# Patient Record
Sex: Male | Born: 1935
Health system: Southern US, Community
[De-identification: ages and names within clinical notes are randomized; demographics above are authoritative.]

## PROBLEM LIST (undated history)

## (undated) DIAGNOSIS — I2510 Atherosclerotic heart disease of native coronary artery without angina pectoris: Secondary | ICD-10-CM

## (undated) DIAGNOSIS — R413 Other amnesia: Secondary | ICD-10-CM

## (undated) DIAGNOSIS — M79606 Pain in leg, unspecified: Secondary | ICD-10-CM

## (undated) DIAGNOSIS — I480 Paroxysmal atrial fibrillation: Secondary | ICD-10-CM

## (undated) DIAGNOSIS — K5732 Diverticulitis of large intestine without perforation or abscess without bleeding: Secondary | ICD-10-CM

## (undated) DIAGNOSIS — I10 Essential (primary) hypertension: Secondary | ICD-10-CM

## (undated) DIAGNOSIS — R600 Localized edema: Secondary | ICD-10-CM

## (undated) DIAGNOSIS — N189 Chronic kidney disease, unspecified: Secondary | ICD-10-CM

## (undated) DIAGNOSIS — E785 Hyperlipidemia, unspecified: Secondary | ICD-10-CM

## (undated) DIAGNOSIS — K219 Gastro-esophageal reflux disease without esophagitis: Secondary | ICD-10-CM

## (undated) DIAGNOSIS — N390 Urinary tract infection, site not specified: Secondary | ICD-10-CM

## (undated) DIAGNOSIS — I509 Heart failure, unspecified: Secondary | ICD-10-CM

## (undated) HISTORY — DX: Localized edema: R60.0

## (undated) HISTORY — DX: Hyperlipidemia, unspecified: E78.5

## (undated) HISTORY — DX: Paroxysmal atrial fibrillation: I48.0

## (undated) HISTORY — DX: Heart failure, unspecified: I50.9

## (undated) HISTORY — DX: Chronic kidney disease, unspecified: N18.9

## (undated) HISTORY — PX: OTHER SURGICAL HISTORY: SHX169

## (undated) HISTORY — DX: Urinary tract infection, site not specified: N39.0

## (undated) HISTORY — DX: Pain in leg, unspecified: M79.606

## (undated) HISTORY — DX: Diverticulitis of large intestine without perforation or abscess without bleeding: K57.32

## (undated) HISTORY — DX: Essential (primary) hypertension: I10

## (undated) HISTORY — DX: Gastro-esophageal reflux disease without esophagitis: K21.9

## (undated) HISTORY — DX: Atherosclerotic heart disease of native coronary artery without angina pectoris: I25.10

## (undated) HISTORY — DX: Other amnesia: R41.3

---

## 1984-10-18 HISTORY — PX: CARDIAC CATHETERIZATION: SHX172

## 1994-10-18 HISTORY — PX: OTHER SURGICAL HISTORY: SHX169

## 1996-10-18 HISTORY — PX: FLEXIBLE SIGMOIDOSCOPY: SHX1649

## 1997-12-16 ENCOUNTER — Encounter: Payer: Self-pay | Admitting: Family Medicine

## 1999-05-19 ENCOUNTER — Encounter: Payer: Self-pay | Admitting: Family Medicine

## 1999-05-19 LAB — CONVERTED CEMR LAB: PSA: 2.8 ng/mL

## 2000-05-18 ENCOUNTER — Encounter: Payer: Self-pay | Admitting: Family Medicine

## 2000-05-18 LAB — CONVERTED CEMR LAB: PSA: 3 ng/mL

## 2001-05-18 ENCOUNTER — Encounter: Payer: Self-pay | Admitting: Family Medicine

## 2001-05-18 LAB — CONVERTED CEMR LAB: PSA: 2.7 ng/mL

## 2002-03-29 ENCOUNTER — Inpatient Hospital Stay (HOSPITAL_COMMUNITY): Admission: EM | Admit: 2002-03-29 | Discharge: 2002-04-01 | Payer: Self-pay | Admitting: Emergency Medicine

## 2002-04-17 ENCOUNTER — Encounter: Payer: Self-pay | Admitting: Cardiology

## 2002-04-17 ENCOUNTER — Ambulatory Visit (HOSPITAL_COMMUNITY): Admission: RE | Admit: 2002-04-17 | Discharge: 2002-04-18 | Payer: Self-pay | Admitting: Cardiology

## 2002-04-17 HISTORY — PX: OTHER SURGICAL HISTORY: SHX169

## 2002-08-18 ENCOUNTER — Encounter: Payer: Self-pay | Admitting: Family Medicine

## 2002-08-18 LAB — CONVERTED CEMR LAB: PSA: 1.4 ng/mL

## 2002-09-27 ENCOUNTER — Ambulatory Visit (HOSPITAL_COMMUNITY): Admission: RE | Admit: 2002-09-27 | Discharge: 2002-09-27 | Payer: Self-pay | Admitting: Cardiology

## 2002-09-27 HISTORY — PX: OTHER SURGICAL HISTORY: SHX169

## 2002-10-03 HISTORY — PX: OTHER SURGICAL HISTORY: SHX169

## 2003-09-18 ENCOUNTER — Encounter: Payer: Self-pay | Admitting: Family Medicine

## 2003-09-18 LAB — CONVERTED CEMR LAB: PSA: 0.8 ng/mL

## 2003-09-26 HISTORY — PX: CYSTOSCOPY: SUR368

## 2003-11-21 ENCOUNTER — Inpatient Hospital Stay (HOSPITAL_COMMUNITY): Admission: EM | Admit: 2003-11-21 | Discharge: 2003-11-22 | Payer: Self-pay | Admitting: Emergency Medicine

## 2003-11-21 HISTORY — PX: OTHER SURGICAL HISTORY: SHX169

## 2003-12-05 HISTORY — PX: ESOPHAGOGASTRODUODENOSCOPY: SHX1529

## 2004-01-06 HISTORY — PX: OTHER SURGICAL HISTORY: SHX169

## 2004-09-02 ENCOUNTER — Ambulatory Visit: Payer: Self-pay | Admitting: Family Medicine

## 2004-10-05 ENCOUNTER — Ambulatory Visit: Payer: Self-pay | Admitting: Family Medicine

## 2004-11-05 ENCOUNTER — Ambulatory Visit: Payer: Self-pay | Admitting: Family Medicine

## 2004-12-07 ENCOUNTER — Ambulatory Visit: Payer: Self-pay | Admitting: Family Medicine

## 2004-12-14 ENCOUNTER — Ambulatory Visit: Payer: Self-pay | Admitting: Family Medicine

## 2004-12-16 ENCOUNTER — Ambulatory Visit: Payer: Self-pay | Admitting: Family Medicine

## 2005-01-05 ENCOUNTER — Ambulatory Visit: Payer: Self-pay | Admitting: Family Medicine

## 2005-01-07 ENCOUNTER — Ambulatory Visit: Payer: Self-pay | Admitting: Family Medicine

## 2005-02-08 ENCOUNTER — Ambulatory Visit: Payer: Self-pay | Admitting: Family Medicine

## 2005-03-11 ENCOUNTER — Ambulatory Visit: Payer: Self-pay | Admitting: Family Medicine

## 2005-03-22 ENCOUNTER — Ambulatory Visit: Payer: Self-pay | Admitting: Family Medicine

## 2005-04-12 ENCOUNTER — Ambulatory Visit: Payer: Self-pay | Admitting: Family Medicine

## 2005-05-12 ENCOUNTER — Ambulatory Visit: Payer: Self-pay | Admitting: Family Medicine

## 2005-06-11 ENCOUNTER — Ambulatory Visit: Payer: Self-pay | Admitting: Family Medicine

## 2005-06-15 ENCOUNTER — Ambulatory Visit: Payer: Self-pay | Admitting: Family Medicine

## 2005-08-11 ENCOUNTER — Ambulatory Visit: Payer: Self-pay | Admitting: Family Medicine

## 2005-10-08 ENCOUNTER — Ambulatory Visit: Payer: Self-pay | Admitting: Family Medicine

## 2005-12-02 ENCOUNTER — Ambulatory Visit: Payer: Self-pay | Admitting: Family Medicine

## 2005-12-20 ENCOUNTER — Ambulatory Visit: Payer: Self-pay | Admitting: Family Medicine

## 2005-12-20 LAB — CONVERTED CEMR LAB: PSA: 0.69 ng/mL

## 2005-12-22 ENCOUNTER — Ambulatory Visit: Payer: Self-pay | Admitting: Family Medicine

## 2006-01-06 ENCOUNTER — Ambulatory Visit: Payer: Self-pay | Admitting: Family Medicine

## 2006-01-19 ENCOUNTER — Ambulatory Visit: Payer: Self-pay | Admitting: Family Medicine

## 2006-01-26 ENCOUNTER — Ambulatory Visit: Payer: Self-pay | Admitting: Family Medicine

## 2006-02-01 ENCOUNTER — Ambulatory Visit: Payer: Self-pay | Admitting: Family Medicine

## 2006-03-01 ENCOUNTER — Ambulatory Visit: Payer: Self-pay | Admitting: Family Medicine

## 2006-04-28 ENCOUNTER — Ambulatory Visit: Payer: Self-pay | Admitting: Family Medicine

## 2006-06-24 ENCOUNTER — Ambulatory Visit: Payer: Self-pay | Admitting: Family Medicine

## 2006-08-25 ENCOUNTER — Ambulatory Visit: Payer: Self-pay | Admitting: Family Medicine

## 2006-09-27 ENCOUNTER — Ambulatory Visit: Payer: Self-pay | Admitting: Family Medicine

## 2006-12-20 ENCOUNTER — Ambulatory Visit: Payer: Self-pay | Admitting: Internal Medicine

## 2006-12-26 ENCOUNTER — Ambulatory Visit: Payer: Self-pay | Admitting: Family Medicine

## 2006-12-26 LAB — CONVERTED CEMR LAB
ALT: 28 units/L (ref 0–40)
AST: 31 units/L (ref 0–37)
Albumin: 3.6 g/dL (ref 3.5–5.2)
Alkaline Phosphatase: 35 units/L — ABNORMAL LOW (ref 39–117)
BUN: 12 mg/dL (ref 6–23)
Basophils Absolute: 0 10*3/uL (ref 0.0–0.1)
Basophils Relative: 0.3 % (ref 0.0–1.0)
Bilirubin, Direct: 0.2 mg/dL (ref 0.0–0.3)
CO2: 32 meq/L (ref 19–32)
Calcium: 9 mg/dL (ref 8.4–10.5)
Chloride: 104 meq/L (ref 96–112)
Cholesterol: 126 mg/dL (ref 0–200)
Creatinine, Ser: 0.9 mg/dL (ref 0.4–1.5)
Eosinophils Absolute: 0.2 10*3/uL (ref 0.0–0.6)
Eosinophils Relative: 4.6 % (ref 0.0–5.0)
Ferritin: 52.6 ng/mL (ref 22.0–322.0)
GFR calc Af Amer: 105 mL/min
GFR calc non Af Amer: 87 mL/min
Glucose, Bld: 89 mg/dL (ref 70–99)
HCT: 35.1 % — ABNORMAL LOW (ref 39.0–52.0)
HDL: 47.3 mg/dL (ref >39.0)
Hemoglobin: 12.1 g/dL — ABNORMAL LOW (ref 13.0–17.0)
LDL Cholesterol: 67 mg/dL (ref 0–99)
Lymphocytes Relative: 29.1 % (ref 12.0–46.0)
MCHC: 34.4 g/dL (ref 30.0–36.0)
MCV: 92.6 fL (ref 78.0–100.0)
Monocytes Absolute: 0.4 10*3/uL (ref 0.2–0.7)
Monocytes Relative: 10.3 % (ref 3.0–11.0)
Neutro Abs: 2.4 10*3/uL (ref 1.4–7.7)
Neutrophils Relative %: 55.7 % (ref 43.0–77.0)
Platelets: 163 10*3/uL (ref 150–400)
Potassium: 3.8 meq/L (ref 3.5–5.1)
RBC: 3.79 M/uL — ABNORMAL LOW (ref 4.22–5.81)
RDW: 12.6 % (ref 11.5–14.6)
Sodium: 141 meq/L (ref 135–145)
TSH: 2.63 microintl units/mL (ref 0.35–5.50)
Total Bilirubin: 1 mg/dL (ref 0.3–1.2)
Total CHOL/HDL Ratio: 2.7
Total Protein: 6.9 g/dL (ref 6.0–8.3)
Triglycerides: 58 mg/dL (ref 0–149)
VLDL: 12 mg/dL (ref 0–40)
Vitamin B-12: 239 pg/mL (ref 211–911)
WBC: 4.3 10*3/uL — ABNORMAL LOW (ref 4.5–10.5)

## 2006-12-27 ENCOUNTER — Encounter: Payer: Self-pay | Admitting: Family Medicine

## 2007-01-12 ENCOUNTER — Ambulatory Visit: Payer: Self-pay | Admitting: Family Medicine

## 2007-01-30 ENCOUNTER — Ambulatory Visit: Payer: Self-pay | Admitting: Family Medicine

## 2007-02-01 ENCOUNTER — Ambulatory Visit: Payer: Self-pay | Admitting: Cardiology

## 2007-02-08 ENCOUNTER — Ambulatory Visit: Payer: Self-pay

## 2007-02-08 ENCOUNTER — Encounter: Payer: Self-pay | Admitting: Family Medicine

## 2007-02-08 LAB — CONVERTED CEMR LAB
BUN: 20 mg/dL (ref 6–23)
CO2: 33 meq/L — ABNORMAL HIGH (ref 19–32)
Calcium: 9.5 mg/dL (ref 8.4–10.5)
Chloride: 104 meq/L (ref 96–112)
Creatinine, Ser: 1.1 mg/dL (ref 0.4–1.5)
GFR calc Af Amer: 83 mL/min
GFR calc non Af Amer: 69 mL/min
Glucose, Bld: 93 mg/dL (ref 70–99)
Potassium: 4 meq/L (ref 3.5–5.1)
Sodium: 142 meq/L (ref 135–145)

## 2007-02-22 ENCOUNTER — Encounter: Payer: Self-pay | Admitting: Family Medicine

## 2007-02-22 DIAGNOSIS — D509 Iron deficiency anemia, unspecified: Secondary | ICD-10-CM

## 2007-02-22 DIAGNOSIS — H269 Unspecified cataract: Secondary | ICD-10-CM

## 2007-02-22 DIAGNOSIS — E291 Testicular hypofunction: Secondary | ICD-10-CM

## 2007-02-22 DIAGNOSIS — Z87898 Personal history of other specified conditions: Secondary | ICD-10-CM

## 2007-02-22 DIAGNOSIS — K5730 Diverticulosis of large intestine without perforation or abscess without bleeding: Secondary | ICD-10-CM | POA: Insufficient documentation

## 2007-02-22 DIAGNOSIS — I10 Essential (primary) hypertension: Secondary | ICD-10-CM

## 2007-02-22 DIAGNOSIS — E538 Deficiency of other specified B group vitamins: Secondary | ICD-10-CM

## 2007-02-22 DIAGNOSIS — D500 Iron deficiency anemia secondary to blood loss (chronic): Secondary | ICD-10-CM | POA: Insufficient documentation

## 2007-02-28 ENCOUNTER — Ambulatory Visit: Payer: Self-pay | Admitting: Family Medicine

## 2007-03-02 ENCOUNTER — Ambulatory Visit: Payer: Self-pay | Admitting: Family Medicine

## 2007-06-05 ENCOUNTER — Ambulatory Visit: Payer: Self-pay | Admitting: Family Medicine

## 2007-06-05 LAB — CONVERTED CEMR LAB
CO2: 29 meq/L (ref 19–32)
Creatinine, Ser: 1.2 mg/dL (ref 0.4–1.5)
GFR calc Af Amer: 75 mL/min
Glucose, Bld: 97 mg/dL (ref 70–99)
Potassium: 3.7 meq/L (ref 3.5–5.1)
Sodium: 141 meq/L (ref 135–145)

## 2007-06-06 ENCOUNTER — Ambulatory Visit: Payer: Self-pay | Admitting: Family Medicine

## 2007-06-08 ENCOUNTER — Telehealth: Payer: Self-pay | Admitting: Family Medicine

## 2007-06-14 ENCOUNTER — Ambulatory Visit: Payer: Self-pay | Admitting: Cardiology

## 2007-06-14 LAB — CONVERTED CEMR LAB
BUN: 15 mg/dL (ref 6–23)
Basophils Relative: 0.7 % (ref 0.0–1.0)
CO2: 28 meq/L (ref 19–32)
Eosinophils Absolute: 0.2 10*3/uL (ref 0.0–0.6)
GFR calc Af Amer: 83 mL/min
GFR calc non Af Amer: 69 mL/min
Hemoglobin: 11.8 g/dL — ABNORMAL LOW (ref 13.0–17.0)
Lymphocytes Relative: 31.3 % (ref 12.0–46.0)
MCHC: 34.3 g/dL (ref 30.0–36.0)
MCV: 92.9 fL (ref 78.0–100.0)
Monocytes Absolute: 0.5 10*3/uL (ref 0.2–0.7)
Monocytes Relative: 9.3 % (ref 3.0–11.0)
Neutro Abs: 2.7 10*3/uL (ref 1.4–7.7)
Platelets: 156 10*3/uL (ref 150–400)
Potassium: 3.6 meq/L (ref 3.5–5.1)
Prothrombin Time: 12.1 s (ref 10.9–13.3)

## 2007-06-16 ENCOUNTER — Ambulatory Visit: Payer: Self-pay | Admitting: Cardiovascular Disease

## 2007-06-16 ENCOUNTER — Inpatient Hospital Stay (HOSPITAL_BASED_OUTPATIENT_CLINIC_OR_DEPARTMENT_OTHER): Admission: RE | Admit: 2007-06-16 | Discharge: 2007-06-16 | Payer: Self-pay | Admitting: Cardiovascular Disease

## 2007-06-27 ENCOUNTER — Ambulatory Visit: Payer: Self-pay | Admitting: Cardiology

## 2007-07-06 ENCOUNTER — Ambulatory Visit: Payer: Self-pay | Admitting: Family Medicine

## 2008-01-18 ENCOUNTER — Ambulatory Visit: Payer: Self-pay | Admitting: Family Medicine

## 2008-01-18 LAB — CONVERTED CEMR LAB
ALT: 27 units/L (ref 0–53)
Albumin: 4 g/dL (ref 3.5–5.2)
BUN: 13 mg/dL (ref 6–23)
Basophils Relative: 0.3 % (ref 0.0–1.0)
CO2: 31 meq/L (ref 19–32)
Calcium: 9.3 mg/dL (ref 8.4–10.5)
Cholesterol: 129 mg/dL (ref 0–200)
Creatinine, Ser: 1.1 mg/dL (ref 0.4–1.5)
Ferritin: 48.4 ng/mL (ref 22.0–322.0)
Glucose, Bld: 102 mg/dL — ABNORMAL HIGH (ref 70–99)
HCT: 36.2 % — ABNORMAL LOW (ref 39.0–52.0)
Hemoglobin: 12 g/dL — ABNORMAL LOW (ref 13.0–17.0)
LDL Cholesterol: 65 mg/dL (ref 0–99)
Lymphocytes Relative: 31.5 % (ref 12.0–46.0)
Monocytes Relative: 12.2 % — ABNORMAL HIGH (ref 3.0–12.0)
Neutro Abs: 2.5 10*3/uL (ref 1.4–7.7)
RBC: 3.9 M/uL — ABNORMAL LOW (ref 4.22–5.81)
Total CHOL/HDL Ratio: 2.5
Total Protein: 7.5 g/dL (ref 6.0–8.3)
Vitamin B-12: 335 pg/mL (ref 211–911)

## 2008-01-22 ENCOUNTER — Ambulatory Visit: Payer: Self-pay | Admitting: Family Medicine

## 2008-01-22 DIAGNOSIS — R609 Edema, unspecified: Secondary | ICD-10-CM

## 2008-02-07 ENCOUNTER — Ambulatory Visit: Payer: Self-pay | Admitting: Family Medicine

## 2008-02-08 LAB — CONVERTED CEMR LAB
CO2: 33 meq/L — ABNORMAL HIGH (ref 19–32)
Calcium: 9.9 mg/dL (ref 8.4–10.5)
Creatinine, Ser: 1.1 mg/dL (ref 0.4–1.5)
Glucose, Bld: 92 mg/dL (ref 70–99)
Sodium: 140 meq/L (ref 135–145)

## 2008-02-09 ENCOUNTER — Ambulatory Visit: Payer: Self-pay | Admitting: Family Medicine

## 2008-02-09 LAB — CONVERTED CEMR LAB
Ketones, urine, test strip: NEGATIVE
Urobilinogen, UA: 0.2
pH: 7

## 2008-02-15 ENCOUNTER — Encounter (INDEPENDENT_AMBULATORY_CARE_PROVIDER_SITE_OTHER): Payer: Self-pay | Admitting: *Deleted

## 2008-02-15 ENCOUNTER — Ambulatory Visit: Payer: Self-pay | Admitting: Family Medicine

## 2008-02-15 LAB — CONVERTED CEMR LAB: OCCULT 1: NEGATIVE

## 2008-02-26 ENCOUNTER — Ambulatory Visit: Payer: Self-pay | Admitting: Family Medicine

## 2008-02-26 LAB — CONVERTED CEMR LAB
BUN: 12 mg/dL (ref 6–23)
Basophils Relative: 1.6 % — ABNORMAL HIGH (ref 0.0–1.0)
CO2: 32 meq/L (ref 19–32)
Chloride: 106 meq/L (ref 96–112)
Creatinine, Ser: 1 mg/dL (ref 0.4–1.5)
Eosinophils Absolute: 0.2 10*3/uL (ref 0.0–0.7)
Eosinophils Relative: 5.6 % — ABNORMAL HIGH (ref 0.0–5.0)
Glucose, Bld: 89 mg/dL (ref 70–99)
Lymphocytes Relative: 34.2 % (ref 12.0–46.0)
MCV: 91.7 fL (ref 78.0–100.0)
Neutrophils Relative %: 46 % (ref 43.0–77.0)
RBC: 3.99 M/uL — ABNORMAL LOW (ref 4.22–5.81)
WBC: 3.9 10*3/uL — ABNORMAL LOW (ref 4.5–10.5)

## 2008-03-01 ENCOUNTER — Ambulatory Visit: Payer: Self-pay | Admitting: Family Medicine

## 2008-03-19 ENCOUNTER — Encounter: Payer: Self-pay | Admitting: Family Medicine

## 2008-06-10 ENCOUNTER — Ambulatory Visit: Payer: Self-pay | Admitting: Family Medicine

## 2008-06-10 DIAGNOSIS — IMO0002 Reserved for concepts with insufficient information to code with codable children: Secondary | ICD-10-CM

## 2008-06-13 ENCOUNTER — Ambulatory Visit: Payer: Self-pay | Admitting: Cardiology

## 2008-06-20 ENCOUNTER — Ambulatory Visit: Payer: Self-pay | Admitting: Cardiology

## 2008-06-20 ENCOUNTER — Ambulatory Visit: Payer: Self-pay

## 2008-06-20 LAB — CONVERTED CEMR LAB
BUN: 16 mg/dL (ref 6–23)
Chloride: 104 meq/L (ref 96–112)
Glucose, Bld: 82 mg/dL (ref 70–99)
Potassium: 3.8 meq/L (ref 3.5–5.1)

## 2008-06-21 ENCOUNTER — Ambulatory Visit: Payer: Self-pay

## 2008-11-29 ENCOUNTER — Ambulatory Visit: Payer: Self-pay | Admitting: Internal Medicine

## 2008-12-13 ENCOUNTER — Ambulatory Visit: Payer: Self-pay | Admitting: Internal Medicine

## 2008-12-13 HISTORY — PX: COLONOSCOPY: SHX174

## 2008-12-13 LAB — HM COLONOSCOPY

## 2009-01-08 ENCOUNTER — Ambulatory Visit: Payer: Self-pay | Admitting: Internal Medicine

## 2009-01-08 ENCOUNTER — Encounter: Payer: Self-pay | Admitting: Family Medicine

## 2009-01-24 ENCOUNTER — Ambulatory Visit: Payer: Self-pay | Admitting: Family Medicine

## 2009-01-29 ENCOUNTER — Ambulatory Visit: Payer: Self-pay | Admitting: Family Medicine

## 2009-01-29 DIAGNOSIS — E559 Vitamin D deficiency, unspecified: Secondary | ICD-10-CM | POA: Insufficient documentation

## 2009-01-29 LAB — CONVERTED CEMR LAB
Bacteria, UA: 0
Bilirubin Urine: NEGATIVE
Glucose, Urine, Semiquant: NEGATIVE
Urobilinogen, UA: 0.2
pH: 6

## 2009-04-23 ENCOUNTER — Ambulatory Visit: Payer: Self-pay | Admitting: Family Medicine

## 2009-04-23 LAB — CONVERTED CEMR LAB
ALT: 38 units/L (ref 0–53)
AST: 44 units/L — ABNORMAL HIGH (ref 0–37)
Albumin: 4.2 g/dL (ref 3.5–5.2)
BUN: 17 mg/dL (ref 6–23)
Basophils Relative: 0.7 % (ref 0.0–3.0)
Chloride: 103 meq/L (ref 96–112)
Cholesterol: 131 mg/dL (ref 0–200)
Eosinophils Relative: 4.5 % (ref 0.0–5.0)
HCT: 37.4 % — ABNORMAL LOW (ref 39.0–52.0)
Hemoglobin: 12.8 g/dL — ABNORMAL LOW (ref 13.0–17.0)
LDL Cholesterol: 69 mg/dL (ref 0–99)
Lymphs Abs: 1.3 10*3/uL (ref 0.7–4.0)
MCV: 94.5 fL (ref 78.0–100.0)
Monocytes Absolute: 0.4 10*3/uL (ref 0.1–1.0)
Monocytes Relative: 10.6 % (ref 3.0–12.0)
Neutro Abs: 1.7 10*3/uL (ref 1.4–7.7)
Platelets: 143 10*3/uL — ABNORMAL LOW (ref 150.0–400.0)
Potassium: 3.5 meq/L (ref 3.5–5.1)
Sodium: 142 meq/L (ref 135–145)
TSH: 2.11 microintl units/mL (ref 0.35–5.50)
Total Bilirubin: 1.3 mg/dL — ABNORMAL HIGH (ref 0.3–1.2)
Total Protein: 7.4 g/dL (ref 6.0–8.3)
Vitamin B-12: 221 pg/mL (ref 211–911)
WBC: 3.6 10*3/uL — ABNORMAL LOW (ref 4.5–10.5)

## 2009-04-30 ENCOUNTER — Ambulatory Visit: Payer: Self-pay | Admitting: Family Medicine

## 2009-06-13 ENCOUNTER — Ambulatory Visit: Payer: Self-pay | Admitting: Cardiology

## 2009-06-13 DIAGNOSIS — I2510 Atherosclerotic heart disease of native coronary artery without angina pectoris: Secondary | ICD-10-CM

## 2009-06-18 ENCOUNTER — Telehealth (INDEPENDENT_AMBULATORY_CARE_PROVIDER_SITE_OTHER): Payer: Self-pay

## 2009-06-19 ENCOUNTER — Ambulatory Visit: Payer: Self-pay

## 2009-06-19 ENCOUNTER — Encounter: Payer: Self-pay | Admitting: Cardiology

## 2009-06-19 ENCOUNTER — Ambulatory Visit: Payer: Self-pay | Admitting: Cardiology

## 2009-06-20 ENCOUNTER — Encounter: Payer: Self-pay | Admitting: Cardiology

## 2009-06-20 ENCOUNTER — Encounter (INDEPENDENT_AMBULATORY_CARE_PROVIDER_SITE_OTHER): Payer: Self-pay | Admitting: *Deleted

## 2009-06-20 DIAGNOSIS — R9430 Abnormal result of cardiovascular function study, unspecified: Secondary | ICD-10-CM | POA: Insufficient documentation

## 2009-06-20 LAB — CONVERTED CEMR LAB
BUN: 17 mg/dL (ref 6–23)
CO2: 30 meq/L (ref 19–32)
Chloride: 104 meq/L (ref 96–112)
Creatinine, Ser: 1 mg/dL (ref 0.4–1.5)
Potassium: 3.7 meq/L (ref 3.5–5.1)

## 2009-06-24 ENCOUNTER — Encounter: Payer: Self-pay | Admitting: Cardiology

## 2009-06-25 ENCOUNTER — Ambulatory Visit: Payer: Self-pay | Admitting: Cardiology

## 2009-06-25 ENCOUNTER — Ambulatory Visit (HOSPITAL_COMMUNITY): Admission: RE | Admit: 2009-06-25 | Discharge: 2009-06-25 | Payer: Self-pay | Admitting: Cardiology

## 2009-06-25 DIAGNOSIS — I4891 Unspecified atrial fibrillation: Secondary | ICD-10-CM | POA: Insufficient documentation

## 2009-06-25 HISTORY — PX: OTHER SURGICAL HISTORY: SHX169

## 2009-06-27 ENCOUNTER — Ambulatory Visit: Payer: Self-pay | Admitting: Internal Medicine

## 2009-06-27 ENCOUNTER — Ambulatory Visit: Payer: Self-pay | Admitting: Cardiology

## 2009-06-27 LAB — CONVERTED CEMR LAB
BUN: 16 mg/dL (ref 6–23)
CO2: 31 meq/L (ref 19–32)
Chloride: 106 meq/L (ref 96–112)
Creatinine, Ser: 1.1 mg/dL (ref 0.4–1.5)
Potassium: 3.6 meq/L (ref 3.5–5.1)

## 2009-07-02 ENCOUNTER — Ambulatory Visit: Payer: Self-pay | Admitting: Internal Medicine

## 2009-07-02 LAB — CONVERTED CEMR LAB: POC INR: 2.2

## 2009-07-09 ENCOUNTER — Ambulatory Visit: Payer: Self-pay | Admitting: Internal Medicine

## 2009-07-09 LAB — CONVERTED CEMR LAB: POC INR: 2.4

## 2009-07-16 ENCOUNTER — Ambulatory Visit: Payer: Self-pay | Admitting: Cardiovascular Disease

## 2009-07-30 ENCOUNTER — Ambulatory Visit: Payer: Self-pay | Admitting: Internal Medicine

## 2009-07-30 LAB — CONVERTED CEMR LAB: POC INR: 1.6

## 2009-08-04 ENCOUNTER — Telehealth: Payer: Self-pay | Admitting: Family Medicine

## 2009-08-13 ENCOUNTER — Ambulatory Visit: Payer: Self-pay | Admitting: Cardiology

## 2009-08-13 LAB — CONVERTED CEMR LAB: POC INR: 2.7

## 2009-09-03 ENCOUNTER — Ambulatory Visit: Payer: Self-pay | Admitting: Cardiovascular Disease

## 2009-09-03 LAB — CONVERTED CEMR LAB: POC INR: 2.4

## 2009-10-01 ENCOUNTER — Ambulatory Visit: Payer: Self-pay | Admitting: Cardiology

## 2009-10-29 ENCOUNTER — Ambulatory Visit: Payer: Self-pay | Admitting: Internal Medicine

## 2009-10-29 ENCOUNTER — Ambulatory Visit: Payer: Self-pay | Admitting: Family Medicine

## 2009-10-29 LAB — CONVERTED CEMR LAB: POC INR: 2.1

## 2009-10-31 LAB — CONVERTED CEMR LAB
Basophils Absolute: 0 10*3/uL (ref 0.0–0.1)
HCT: 34.2 % — ABNORMAL LOW (ref 39.0–52.0)
Iron: 62 ug/dL (ref 42–165)
Lymphs Abs: 1 10*3/uL (ref 0.7–4.0)
MCV: 97.3 fL (ref 78.0–100.0)
Monocytes Absolute: 0.4 10*3/uL (ref 0.1–1.0)
Neutrophils Relative %: 56.2 % (ref 43.0–77.0)
Platelets: 148 10*3/uL — ABNORMAL LOW (ref 150.0–400.0)
RDW: 13 % (ref 11.5–14.6)
Vitamin B-12: 294 pg/mL (ref 211–911)
WBC: 3.7 10*3/uL — ABNORMAL LOW (ref 4.5–10.5)

## 2009-11-17 ENCOUNTER — Ambulatory Visit: Payer: Self-pay | Admitting: Family Medicine

## 2009-11-26 ENCOUNTER — Ambulatory Visit: Payer: Self-pay | Admitting: Cardiology

## 2009-12-10 ENCOUNTER — Ambulatory Visit: Payer: Self-pay | Admitting: Cardiology

## 2009-12-10 ENCOUNTER — Ambulatory Visit: Payer: Self-pay | Admitting: Internal Medicine

## 2009-12-10 DIAGNOSIS — I5033 Acute on chronic diastolic (congestive) heart failure: Secondary | ICD-10-CM

## 2009-12-11 LAB — CONVERTED CEMR LAB
BUN: 20 mg/dL (ref 6–23)
CO2: 32 meq/L (ref 19–32)
Calcium: 9.5 mg/dL (ref 8.4–10.5)
Chloride: 102 meq/L (ref 96–112)
Creatinine, Ser: 1.3 mg/dL (ref 0.4–1.5)
Eosinophils Relative: 4.1 % (ref 0.0–5.0)
Glucose, Bld: 97 mg/dL (ref 70–99)
HCT: 33.5 % — ABNORMAL LOW (ref 39.0–52.0)
Hemoglobin: 11.2 g/dL — ABNORMAL LOW (ref 13.0–17.0)
Lymphs Abs: 1 10*3/uL (ref 0.7–4.0)
Monocytes Relative: 12 % (ref 3.0–12.0)
Neutro Abs: 2.1 10*3/uL (ref 1.4–7.7)
RBC: 3.49 M/uL — ABNORMAL LOW (ref 4.22–5.81)
WBC: 3.8 10*3/uL — ABNORMAL LOW (ref 4.5–10.5)

## 2009-12-17 ENCOUNTER — Ambulatory Visit: Payer: Self-pay | Admitting: Cardiology

## 2009-12-24 ENCOUNTER — Ambulatory Visit: Payer: Self-pay | Admitting: Cardiovascular Disease

## 2009-12-24 LAB — CONVERTED CEMR LAB: POC INR: 1.7

## 2009-12-30 ENCOUNTER — Ambulatory Visit: Payer: Self-pay | Admitting: Cardiology

## 2009-12-30 LAB — CONVERTED CEMR LAB: POC INR: 2.6

## 2010-01-05 LAB — CONVERTED CEMR LAB
CO2: 33 meq/L — ABNORMAL HIGH (ref 19–32)
Calcium: 9.5 mg/dL (ref 8.4–10.5)
Chloride: 102 meq/L (ref 96–112)
Sodium: 140 meq/L (ref 135–145)

## 2010-01-06 ENCOUNTER — Ambulatory Visit: Payer: Self-pay | Admitting: Cardiovascular Disease

## 2010-01-06 LAB — CONVERTED CEMR LAB: POC INR: 2.5

## 2010-01-13 ENCOUNTER — Ambulatory Visit: Payer: Self-pay | Admitting: Internal Medicine

## 2010-01-13 ENCOUNTER — Encounter (INDEPENDENT_AMBULATORY_CARE_PROVIDER_SITE_OTHER): Payer: Self-pay | Admitting: Cardiology

## 2010-01-13 LAB — CONVERTED CEMR LAB: POC INR: 4.1

## 2010-01-18 HISTORY — PX: OTHER SURGICAL HISTORY: SHX169

## 2010-01-19 ENCOUNTER — Encounter: Payer: Self-pay | Admitting: Cardiology

## 2010-01-21 ENCOUNTER — Ambulatory Visit: Payer: Self-pay | Admitting: Cardiology

## 2010-01-21 ENCOUNTER — Ambulatory Visit: Payer: Self-pay

## 2010-01-21 ENCOUNTER — Encounter: Payer: Self-pay | Admitting: Cardiology

## 2010-01-21 ENCOUNTER — Ambulatory Visit (HOSPITAL_COMMUNITY): Admission: RE | Admit: 2010-01-21 | Discharge: 2010-01-21 | Payer: Self-pay | Admitting: Cardiology

## 2010-01-21 LAB — CONVERTED CEMR LAB: POC INR: 3.7

## 2010-01-27 ENCOUNTER — Telehealth: Payer: Self-pay | Admitting: Family Medicine

## 2010-01-28 ENCOUNTER — Ambulatory Visit: Payer: Self-pay | Admitting: Family Medicine

## 2010-01-28 LAB — CONVERTED CEMR LAB
ALT: 29 units/L (ref 0–53)
HDL: 45.3 mg/dL (ref >39.00)

## 2010-02-02 ENCOUNTER — Encounter (INDEPENDENT_AMBULATORY_CARE_PROVIDER_SITE_OTHER): Payer: Self-pay | Admitting: *Deleted

## 2010-02-02 ENCOUNTER — Ambulatory Visit: Payer: Self-pay | Admitting: Cardiology

## 2010-02-02 LAB — CONVERTED CEMR LAB: POC INR: 3.6

## 2010-02-03 ENCOUNTER — Ambulatory Visit (HOSPITAL_COMMUNITY): Admission: RE | Admit: 2010-02-03 | Discharge: 2010-02-03 | Payer: Self-pay | Admitting: Cardiology

## 2010-02-03 HISTORY — PX: CARDIOVERSION: SHX1299

## 2010-02-05 ENCOUNTER — Telehealth: Payer: Self-pay | Admitting: Cardiology

## 2010-02-09 ENCOUNTER — Telehealth: Payer: Self-pay | Admitting: Family Medicine

## 2010-02-11 ENCOUNTER — Ambulatory Visit: Payer: Self-pay | Admitting: Family Medicine

## 2010-02-11 LAB — CONVERTED CEMR LAB
ALT: 19 units/L (ref 0–53)
AST: 24 units/L (ref 0–37)
HDL: 34.6 mg/dL — ABNORMAL LOW (ref >39.00)
Total CHOL/HDL Ratio: 5

## 2010-02-25 ENCOUNTER — Ambulatory Visit: Payer: Self-pay | Admitting: Cardiology

## 2010-02-25 ENCOUNTER — Ambulatory Visit: Payer: Self-pay | Admitting: Internal Medicine

## 2010-02-25 LAB — CONVERTED CEMR LAB: POC INR: 1.5

## 2010-02-26 LAB — CONVERTED CEMR LAB
CO2: 32 meq/L (ref 19–32)
Chloride: 101 meq/L (ref 96–112)
Creatinine, Ser: 1.4 mg/dL (ref 0.4–1.5)
Potassium: 4 meq/L (ref 3.5–5.1)
Sodium: 142 meq/L (ref 135–145)

## 2010-03-04 ENCOUNTER — Ambulatory Visit: Payer: Self-pay | Admitting: Cardiology

## 2010-03-04 LAB — CONVERTED CEMR LAB
BUN: 27 mg/dL — ABNORMAL HIGH (ref 6–23)
CO2: 31 meq/L (ref 19–32)
Chloride: 101 meq/L (ref 96–112)
Glucose, Bld: 96 mg/dL (ref 70–99)
Potassium: 3.9 meq/L (ref 3.5–5.1)
Sodium: 142 meq/L (ref 135–145)

## 2010-03-06 ENCOUNTER — Telehealth: Payer: Self-pay | Admitting: Cardiology

## 2010-03-11 ENCOUNTER — Ambulatory Visit: Payer: Self-pay | Admitting: Cardiology

## 2010-03-11 ENCOUNTER — Ambulatory Visit: Payer: Self-pay | Admitting: Internal Medicine

## 2010-03-13 ENCOUNTER — Telehealth (INDEPENDENT_AMBULATORY_CARE_PROVIDER_SITE_OTHER): Payer: Self-pay | Admitting: *Deleted

## 2010-03-18 LAB — CONVERTED CEMR LAB
BUN: 28 mg/dL — ABNORMAL HIGH (ref 6–23)
CO2: 32 meq/L (ref 19–32)
Chloride: 101 meq/L (ref 96–112)
Creatinine, Ser: 1.9 mg/dL — ABNORMAL HIGH (ref 0.4–1.5)
Glucose, Bld: 91 mg/dL (ref 70–99)
Potassium: 3.7 meq/L (ref 3.5–5.1)

## 2010-03-25 ENCOUNTER — Ambulatory Visit: Payer: Self-pay | Admitting: Family Medicine

## 2010-03-25 LAB — CONVERTED CEMR LAB: ALT: 26 units/L (ref 0–53)

## 2010-03-26 ENCOUNTER — Ambulatory Visit: Payer: Self-pay | Admitting: Internal Medicine

## 2010-03-26 ENCOUNTER — Ambulatory Visit: Payer: Self-pay | Admitting: Cardiology

## 2010-03-27 ENCOUNTER — Telehealth: Payer: Self-pay | Admitting: Cardiology

## 2010-04-02 ENCOUNTER — Ambulatory Visit: Payer: Self-pay | Admitting: Cardiology

## 2010-04-06 LAB — CONVERTED CEMR LAB
BUN: 24 mg/dL — ABNORMAL HIGH (ref 6–23)
Chloride: 100 meq/L (ref 96–112)
GFR calc non Af Amer: 57.26 mL/min (ref >60)
Glucose, Bld: 96 mg/dL (ref 70–99)
Potassium: 3.9 meq/L (ref 3.5–5.1)
Sodium: 140 meq/L (ref 135–145)

## 2010-04-13 ENCOUNTER — Ambulatory Visit: Payer: Self-pay | Admitting: Cardiology

## 2010-04-13 LAB — CONVERTED CEMR LAB: POC INR: 2.1

## 2010-04-24 ENCOUNTER — Ambulatory Visit: Payer: Self-pay | Admitting: Cardiology

## 2010-04-24 LAB — CONVERTED CEMR LAB: POC INR: 1.2

## 2010-05-04 ENCOUNTER — Ambulatory Visit: Payer: Self-pay | Admitting: Cardiology

## 2010-05-04 LAB — CONVERTED CEMR LAB: POC INR: 2.2

## 2010-05-25 ENCOUNTER — Ambulatory Visit: Payer: Self-pay | Admitting: Cardiovascular Disease

## 2010-05-25 LAB — CONVERTED CEMR LAB: POC INR: 1.9

## 2010-05-26 ENCOUNTER — Encounter (INDEPENDENT_AMBULATORY_CARE_PROVIDER_SITE_OTHER): Payer: Self-pay | Admitting: *Deleted

## 2010-06-15 ENCOUNTER — Ambulatory Visit: Payer: Self-pay | Admitting: Cardiology

## 2010-07-06 ENCOUNTER — Ambulatory Visit: Payer: Self-pay | Admitting: Cardiovascular Disease

## 2010-07-06 LAB — CONVERTED CEMR LAB: POC INR: 1.3

## 2010-07-13 ENCOUNTER — Encounter: Payer: Self-pay | Admitting: Internal Medicine

## 2010-07-16 ENCOUNTER — Ambulatory Visit: Payer: Self-pay | Admitting: Cardiology

## 2010-07-16 LAB — CONVERTED CEMR LAB: POC INR: 1.3

## 2010-07-23 ENCOUNTER — Telehealth (INDEPENDENT_AMBULATORY_CARE_PROVIDER_SITE_OTHER): Payer: Self-pay | Admitting: *Deleted

## 2010-07-23 ENCOUNTER — Ambulatory Visit: Payer: Self-pay | Admitting: Family Medicine

## 2010-07-23 LAB — CONVERTED CEMR LAB
ALT: 23 units/L (ref 0–53)
AST: 34 units/L (ref 0–37)
Alkaline Phosphatase: 37 units/L — ABNORMAL LOW (ref 39–117)
BUN: 30 mg/dL — ABNORMAL HIGH (ref 6–23)
Basophils Absolute: 0 10*3/uL (ref 0.0–0.1)
Bilirubin, Direct: 0.1 mg/dL (ref 0.0–0.3)
CO2: 32 meq/L (ref 19–32)
Calcium: 8.9 mg/dL (ref 8.4–10.5)
Chloride: 99 meq/L (ref 96–112)
Cholesterol: 148 mg/dL (ref 0–200)
Creatinine, Ser: 1.5 mg/dL (ref 0.4–1.5)
Eosinophils Absolute: 0.2 10*3/uL (ref 0.0–0.7)
HCT: 32.1 % — ABNORMAL LOW (ref 39.0–52.0)
Lymphs Abs: 1.8 10*3/uL (ref 0.7–4.0)
MCV: 97.5 fL (ref 78.0–100.0)
Monocytes Absolute: 0.5 10*3/uL (ref 0.1–1.0)
Neutrophils Relative %: 38.8 % — ABNORMAL LOW (ref 43.0–77.0)
PSA: 0.79 ng/mL (ref 0.10–4.00)
Platelets: 163 10*3/uL (ref 150.0–400.0)
RDW: 13.3 % (ref 11.5–14.6)
Total Protein: 7.2 g/dL (ref 6.0–8.3)
Triglycerides: 81 mg/dL (ref 0.0–149.0)
Vitamin B-12: 348 pg/mL (ref 211–911)

## 2010-07-27 ENCOUNTER — Ambulatory Visit: Payer: Self-pay | Admitting: Cardiology

## 2010-07-30 ENCOUNTER — Ambulatory Visit: Payer: Self-pay | Admitting: Family Medicine

## 2010-07-31 ENCOUNTER — Encounter: Payer: Self-pay | Admitting: Family Medicine

## 2010-08-04 ENCOUNTER — Ambulatory Visit: Payer: Self-pay | Admitting: Cardiology

## 2010-08-04 ENCOUNTER — Encounter: Payer: Self-pay | Admitting: Cardiology

## 2010-08-04 LAB — CONVERTED CEMR LAB: POC INR: 1.3

## 2010-08-14 ENCOUNTER — Ambulatory Visit: Payer: Self-pay | Admitting: Cardiovascular Disease

## 2010-08-14 LAB — CONVERTED CEMR LAB: POC INR: 1.1

## 2010-08-21 ENCOUNTER — Ambulatory Visit: Payer: Self-pay | Admitting: Cardiology

## 2010-08-21 LAB — CONVERTED CEMR LAB: POC INR: 1.5

## 2010-08-28 ENCOUNTER — Ambulatory Visit: Payer: Self-pay | Admitting: Cardiology

## 2010-08-28 LAB — CONVERTED CEMR LAB: POC INR: 1.6

## 2010-09-11 ENCOUNTER — Ambulatory Visit: Payer: Self-pay | Admitting: Cardiology

## 2010-09-11 LAB — CONVERTED CEMR LAB: POC INR: 3.9

## 2010-09-25 ENCOUNTER — Ambulatory Visit: Payer: Self-pay | Admitting: Internal Medicine

## 2010-10-09 ENCOUNTER — Ambulatory Visit: Payer: Self-pay | Admitting: Cardiology

## 2010-10-09 LAB — CONVERTED CEMR LAB: POC INR: 2.8

## 2010-10-30 ENCOUNTER — Ambulatory Visit
Admission: RE | Admit: 2010-10-30 | Discharge: 2010-10-30 | Payer: Self-pay | Source: Home / Self Care | Attending: Cardiology | Admitting: Cardiology

## 2010-11-17 NOTE — Medication Information (Signed)
Summary: rov/sp  Anticoagulant Therapy  Managed by: Eda Keys, PharmD Referring MD: Charlies Constable, MD PCP: Shaune Leeks MD Supervising MD: Ladona Ridgel MD, Sharlot Gowda Indication 1: Atrial Fibrillation Lab Used: [facility] [place] Site: [address] INR POC 1.5 INR RANGE 2-3  Dietary changes: no    Health status changes: no    Bleeding/hemorrhagic complications: no    Recent/future hospitalizations: no    Any changes in medication regimen? no    Recent/future dental: no  Any missed doses?: no       Is patient compliant with meds? yes       Allergies: 1)  ! Pcn 2)  Naprosyn (Naproxen)  Anticoagulation Management History:      The patient is taking warfarin and comes in today for a routine follow up visit.  Positive risk factors for bleeding include an age of 75 years or older and presence of serious comorbidities.  The bleeding index is 'intermediate risk'.  Positive CHADS2 values include History of CHF, History of HTN, and Age > 41 years old.  His last INR was 1.0 RATIO.  Anticoagulation responsible provider: Ladona Ridgel MD, Sharlot Gowda.  INR POC: 1.5.  Cuvette Lot#: [id number].  Exp: 05/2011.    Anticoagulation Management Assessment/Plan:      The patient's current anticoagulation dose is Warfarin sodium 5 mg tabs: Use as directed by Anticoagulation Clinic.  The target INR is 2.0-3.0.  The next INR is due 03/11/2010.  Anticoagulation instructions were given to patient.  Results were reviewed/authorized by Eda Keys, PharmD.  He was notified by Eda Keys.         Prior Anticoagulation Instructions: INR 3.6  Skip tomorrow's dose of Coumadin then decrease dose to 1 tablet every day except 1/2 tablet on Monday and Friday   Current Anticoagulation Instructions: INR 1.5  Take 1.5 tablets today.  Start NEW dosing schedule of 1/2 tablet on Monday only, and 1 tablet all other days.  Return to clinic in 2 weeks.

## 2010-11-17 NOTE — Assessment & Plan Note (Signed)
Summary: f25m   Visit Type:  Follow-up Primary Provider:  Shaune Leeks MD  CC:  no complaints.  History of Present Illness: patient is 77 years old and return for management of atrial fibrillation, CAD, and CHF. He had an inferior MI in 2003 treated with a bare-metal stent to the RCA and he later had a drug-eluting stent to the circumflex artery. Last fall he developed new onset atrial fibrillation and recently he underwent treatment with Multaq followedl by DC cardioversion. He has also had diastolic heart failure and at his last visit we increased his Lasix dosage. He had a baseline creatinine of 1.4 and went up to 1.8 following increased Lasix now back to baseline.   His other problems include hypertension, hyperlipidemia, and peripheral vascular disease.  Current Medications (verified): 1)  Cardura 8 Mg Tabs (Doxazosin Mesylate) .... Take 1/2 Tab  By Mouth Daily 2)  Iron 325 (65 Fe) Mg Tabs (Ferrous Sulfate) .... Take One By Mouth Twice A Day 3)  Simvastatin 80 Mg Tabs (Simvastatin) .... One Tab By Mouth At Night 4)  Nitrostat 0.4 Mg Subl (Nitroglycerin) .... Use Sl As Directed As Needed 5)  Fosamax 10 Mg Tabs (Alendronate Sodium) .Marland Kitchen.. 1 Daily By Mouth 6)  Ramipril 10 Mg Caps (Ramipril) .... Take 1 Capsule By Mouth Twice A Day 7)  Furosemide 40 Mg Tabs (Furosemide) .... Take 1 and 1/2 Tablets in The Morning and One Tablet in The Afternoon. 8)  Vitamin D3 1000 Unit Caps (Cholecalciferol) .Marland Kitchen.. 1 Daily By Mouth 9)  Warfarin Sodium 5 Mg Tabs (Warfarin Sodium) .... Use As Directed By Anticoagulation Clinic 10)  Buffered Aspirin 325 Mg Tabs (Aspirin Buf(Cacarb-Mgcarb-Mgo)) .... Take One By Mouth Daily 11)  Multivitamins  Tabs (Multiple Vitamin) .... Take One By Mouth Daily 12)  Multaq 400 Mg Tabs (Dronedarone Hcl) .... Take One Tab By Mouth Two Times A Day 13)  Potassium Chloride Crys Cr 20 Meq Cr-Tabs (Potassium Chloride Crys Cr) .... Take One Tablet By Mouth Daily  Allergies  (verified): 1)  ! Pcn 2)  Naprosyn (Naproxen)  Review of Systems       negative except per HPI  Vital Signs:  Patient profile:   77 year old male Height:      71 inches Weight:      195 pounds BMI:     27.30 Pulse rate:   70 / minute BP sitting:   140 / 70  (left arm) Cuff size:   large  Vitals Entered By: Burnett Kanaris, CNA (August 04, 2010 10:18 AM)  Physical Exam  General:  Well-developed,well-nourished,in no acute distress; alert,appropriate and cooperative throughout examination Neck:  No deformities, masses, or tenderness noted. Lungs:  Normal respiratory effort, chest expands symmetrically. Lungs are clear to auscultation, no crackles or wheezes. Heart:  Well comntrolled rate with irregular rhythm. S1 and S2 normal without gallop, murmur, click, rub or other extra sounds. Carotids silent. Pulses:  R and L carotid,radial,femoral,dorsalis pedis and posterior tibial pulses are full and equal bilaterally Extremities:  No clubbing, cyanosis or deformity noted with normal full range of motion of all joints.  1-2+ edema left foot, right nml Neurologic:  No cranial nerve deficits noted. Station and gait are normal. Sensory, motor and coordinative functions appear intact.   Impression & Recommendations:  Problem # 1:  DIASTOLIC HEART FAILURE, ACUTE ON CHRONIC (ICD-428.33) Assessment Comment Only currently euvolemic on current lasix dose. Cr. back to baseline.   His updated medication list for this problem  includes:    Nitrostat 0.4 Mg Subl (Nitroglycerin) ..... Use sl as directed as needed    Ramipril 10 Mg Caps (Ramipril) .Marland Kitchen... Take 1 capsule by mouth twice a day    Furosemide 40 Mg Tabs (Furosemide) .Marland Kitchen... Take 1 and 1/2 tablets in the morning and one tablet in the afternoon.    Warfarin Sodium 5 Mg Tabs (Warfarin sodium) ..... Use as directed by anticoagulation clinic    Buffered Aspirin 325 Mg Tabs (Aspirin buf(cacarb-mgcarb-mgo)) .Marland Kitchen... Take one by mouth daily    Multaq  400 Mg Tabs (Dronedarone hcl) .Marland Kitchen... Take one tab by mouth two times a day  Problem # 2:  ATRIAL FIBRILLATION (ICD-427.31) Assessment: Comment Only  His rhythm appears regular diet it appears that he is holding sinus rhythm. Multaq was too expensive for him so he did not fill that prescription. We'll ask them to finish up his samples and will leave him off rhythm control medications with the hopes that he'll hold sinus rhythm. If we add amiodarone afraid his rate will get too slow he'll need a pacemaker. We will continue Coumadin.  His updated medication list for this problem includes:    Warfarin Sodium 5 Mg Tabs (Warfarin sodium) ..... Use as directed by anticoagulation clinic    Buffered Aspirin 325 Mg Tabs (Aspirin buf(cacarb-mgcarb-mgo)) .Marland Kitchen... Take one by mouth daily    Multaq 400 Mg Tabs (Dronedarone hcl) .Marland Kitchen... Take one tab by mouth two times a day  Orders: EKG w/ Interpretation (93000)  Problem # 3:  CAD, NATIVE VESSEL (ICD-414.01) Assessment: Comment Only He had a previous anterior MI and previous stenting of the right coronary with a bare-metal stent and the circumflex artery with a drug-eluting stent. He has had no chest pain this problem appears stable.  His updated medication list for this problem includes:    Nitrostat 0.4 Mg Subl (Nitroglycerin) ..... Use sl as directed as needed    Ramipril 10 Mg Caps (Ramipril) .Marland Kitchen... Take 1 capsule by mouth twice a day    Warfarin Sodium 5 Mg Tabs (Warfarin sodium) ..... Use as directed by anticoagulation clinic    Buffered Aspirin 325 Mg Tabs (Aspirin buf(cacarb-mgcarb-mgo)) .Marland Kitchen... Take one by mouth daily  Problem # 4:  HYPERTENSION (ICD-401.9) Assessment: Comment Only Well controlled on current treatment, No new changes made today, Will continue to monitor.   His updated medication list for this problem includes:    Cardura 8 Mg Tabs (Doxazosin mesylate) .Marland Kitchen... Take 1/2 tab  by mouth daily    Ramipril 10 Mg Caps (Ramipril) .Marland Kitchen... Take 1  capsule by mouth twice a day    Furosemide 40 Mg Tabs (Furosemide) .Marland Kitchen... Take 1 and 1/2 tablets in the morning and one tablet in the afternoon.    Buffered Aspirin 325 Mg Tabs (Aspirin buf(cacarb-mgcarb-mgo)) .Marland Kitchen... Take one by mouth daily  BP today: 140/70 Prior BP: 120/70 (07/30/2010)  Labs Reviewed: K+: 3.9 (07/23/2010) Creat: : 1.5 (07/23/2010)   Chol: 148 (07/23/2010)   HDL: 53.20 (07/23/2010)   LDL: 79 (07/23/2010)   TG: 81.0 (07/23/2010)  Patient Instructions: 1)  Your physician recommends that you schedule a follow-up appointment in: 4 months with Dr. Mariah Milling in the [place] office. 2)  Your physician recommends that you continue on your current medications as directed. Please refer to the Current Medication list given to you today.

## 2010-11-17 NOTE — Medication Information (Signed)
Summary: rov/tp  Anticoagulant Therapy  Managed by: Geoffry Paradise, PharmD Referring MD: Charlies Constable, MD PCP: Shaune Leeks MD Supervising MD: Johney Frame MD, Fayrene Fearing Indication 1: Atrial Fibrillation Lab Used: [facility] [place] Site: [address] INR POC 1.6 INR RANGE 2-3  Dietary changes: no    Health status changes: no    Bleeding/hemorrhagic complications: no    Recent/future hospitalizations: no    Any changes in medication regimen? no    Recent/future dental: no  Any missed doses?: no       Is patient compliant with meds? yes       Allergies: 1)  ! Pcn 2)  Naprosyn (Naproxen)  Anticoagulation Management History:      Positive risk factors for bleeding include an age of 75 years or older and presence of serious comorbidities.  The bleeding index is 'intermediate risk'.  Positive CHADS2 values include History of CHF, History of HTN, and Age > 23 years old.  His last INR was 1.0 RATIO.  Anticoagulation responsible provider: Allred MD, Fayrene Fearing.  INR POC: 1.6.  Cuvette Lot#: [id number].  Exp: 11/2011.    Anticoagulation Management Assessment/Plan:      The patient's current anticoagulation dose is Warfarin sodium 5 mg tabs: Use as directed by Anticoagulation Clinic.  The target INR is 2.0-3.0.  The next INR is due 1May 20, 202011.  Anticoagulation instructions were given to patient.  Results were reviewed/authorized by Geoffry Paradise, PharmD.         Prior Anticoagulation Instructions: INR 3.9  Skip tomorrow's dose of Coumadin then decrease dose to 2 tablets every day except 1 1/2 tablets on Monday and Thursday.  Recheck INR in 2 weeks.   Current Anticoagulation Instructions: INR:  1.6  Your INR is low today, but you have been eating green leafy vegetables for the past 4 days.  Take 3 tablets tonight and tomorrow night then return to your normal schedule of 1.5 tablets Monday & Thursday, and 2 tablets other days of the week.  Return to clinic in 2 weeks.

## 2010-11-17 NOTE — Progress Notes (Signed)
Summary: Replacement for Vytorin  Phone Note Call from Patient Call back at Home Phone [phone]   Caller: Patient Call For: Shaune Leeks MD Summary of Call: Please see phone note dated 01/27/2010.  Patient says there was supposed to be 2 new medications phoned in to the pharmacy to replace the Vytorin but the pharmacy says they don't have it.  [facility] Phone:   [phone] Initial call taken by: Delilah Shan CMA Duncan Dull),  February 09, 2010 3:14 PM  Follow-up for Phone Call        Please read  the note of 4/12. He is supposed to see me while still on Vytorin after getting lab to check chol.. please have him see me, he has had the lab done. Follow-up by: Shaune Leeks MD,  February 09, 2010 3:23 PM  Additional Follow-up for Phone Call Additional follow up Details #1::        Patient Advised.  Additional Follow-up by: Delilah Shan CMA Duncan Dull),  February 09, 2010 3:53 PM

## 2010-11-17 NOTE — Miscellaneous (Signed)
Summary: Multaq Review  Clinical Lists Changes   EP Quality review of patient taking Multaq.  I have reviewed clinical chart which reveals that Tony Reed is no longer taking multaq. Given diastolic CHF, I think it is reasonable to avoid multaq in this patient.

## 2010-11-17 NOTE — Assessment & Plan Note (Signed)
Summary: CPX/DLO   Vital Signs:  Patient profile:   77 year old male Weight:      194 pounds Temp:     97.6 degrees F oral Pulse rate:   68 / minute Pulse rhythm:   regular BP sitting:   120 / 70  (left arm) Cuff size:   large  Vitals Entered By: Sydell Axon LPN (July 30, 2010 1:53 PM) CC: 30 Minute checkup   History of Present Illness: Pt here for followup. He is aware that he is having difficulty rememberoing things but he is aware of it. He is still able to do his usual things. His wife has noticed that he is forgetting things if he doesn't do them right away. The swelling in his feet has pretty much resolved.  Preventive Screening-Counseling & Management  Alcohol-Tobacco     Alcohol drinks/day: 0     Smoking Status: never     Passive Smoke Exposure: no  Caffeine-Diet-Exercise     Caffeine use/day: 1     Does Patient Exercise: yes     Type of exercise: walks     Exercise (avg: min/session): 1-2 hrs     Times/week: 3  Problems Prior to Update: 1)  Diastolic Heart Failure, Acute On Chronic  (ICD-428.33) 2)  Atrial Fibrillation  (ICD-427.31) 3)  Cardiovascular Function Study, Abnormal  (ICD-794.30) 4)  Cad, Native Vessel  (ICD-414.01) 5)  Unspecified Vitamin D Deficiency  (ICD-268.9) 6)  Knee Sprain, Left  (ICD-844.9) 7)  Special Screening Malig Neoplasms Other Sites  (ICD-V76.49) 8)  Edema  (ICD-782.3) 9)  Screening For Malignant Neoplasm, Prostate  (ICD-V76.44) 10)  Anemia, Iron Deficiency  (ICD-280.9) 11)  Anemia Due To Chronic Blood Loss  (ICD-280.0) 12)  Hematuria W/u Neg (PETERSON)  (ICD-599.7) 13)  B12 Deficiency  (ICD-266.2) 14)  Benign Prostatic Hypertrophy, Hx of  (ICD-V13.8) 15)  Testosterone Deficiency  (ICD-257.2) 16)  Cataract, Left Eye  (ICD-366.9) 17)  Polypectomy, Hx of (DR. NELMS)  (ICD-V15.9) 18)  Gerd, H. H. in Ugi  (ICD-530.81) 19)  Hypercholesterolemia (236 LDL 170)  (ICD-272.0) 20)  Diverticulosis, Colon  (ICD-562.10) 21)  Osteopenia  Hip/ Porosis Spine  (ICD-733.90) 22)  Myocardial Infarction, Hx of (INF WALL)  (ICD-412) 23)  Hypertension  (ICD-401.9)  Medications Prior to Update: 1)  Cardura 8 Mg Tabs (Doxazosin Mesylate) .... Take 1/2 Tab  By Mouth Daily 2)  Iron 325 (65 Fe) Mg Tabs (Ferrous Sulfate) .... Take One By Mouth Twice A Day 3)  Simvastatin 80 Mg Tabs (Simvastatin) .... One Tab By Mouth At Night 4)  Nitrolingual 0.4 Mg/spray Soln (Nitroglycerin) .... Use As Needed As Directed 5)  Fosamax 10 Mg Tabs (Alendronate Sodium) .Marland Kitchen.. 1 Daily By Mouth 6)  Ramipril 10 Mg Caps (Ramipril) .... Take 1 Capsule By Mouth Twice A Day 7)  Furosemide 40 Mg Tabs (Furosemide) .... Take 1 and 1/2 Tablets in The Morning and One Tablet in The Afternoon. 8)  Vitamin D3 1000 Unit Caps (Cholecalciferol) .Marland Kitchen.. 1 Daily By Mouth 9)  Warfarin Sodium 5 Mg Tabs (Warfarin Sodium) .... Use As Directed By Anticoagulation Clinic 10)  Buffered Aspirin 325 Mg Tabs (Aspirin Buf(Cacarb-Mgcarb-Mgo)) .... Take One By Mouth Daily 11)  Multivitamins  Tabs (Multiple Vitamin) .... Take One By Mouth Daily 12)  Multaq 400 Mg Tabs (Dronedarone Hcl) .... Take One Tab By Mouth Two Times A Day 13)  Potassium Chloride Crys Cr 20 Meq Cr-Tabs (Potassium Chloride Crys Cr) .... Take One Tablet By Mouth Daily  Allergies: 1)  ! Pcn 2)  Naprosyn (Naproxen)  Past History:  Past Medical History: Last updated: 06/12/2009 Hypertension Myocardial infarction, hx of Osteopenia Osteoporosis Diverticulosis, colon GERD 1. Coronary artery disease status post diaphragmatic wall infarction     in 2003, treated with stenting to the right coronary with a bare-     metal stent with subsequent elective stenting of the circumflex     artery with a Cypher stent with nonobstructive disease at last     catheterization in 2008. 2. Hypertension, now under good control. 3. Hyperlipidemia. 4. Leg pain and decreased pulses consistent with peripheral vascular     disease. 5. Edema,  left lower extremity  Past Surgical History: Last updated: 02/04/2010 Herniorraphy x2 Poypectomy Hemmoroidectoy Choleycystectomy 1996 Flex sig. Dr. Hetty Ely 01/98 Cath, normal 1986 Cath, stenting-PTCA x 2 Melina Fiddler) 03/29/02 Stent in distal circum,  04/17/02 Cath, stents 09/27/02 Adenosine cardiolite old infarct inf o/w negative EF 61% 10/03/02 Cystoscopy wnl Vonita Moss) 09/26/03 Hosp. [facility] R/O 01/06/04 CT abd. mod. H. H., CT pelvis elevated  prostate 11/21/03 Adenosine cardiollite inf. thin, prior small infarct MI EF 58% 11/26/03 EGD, positive H-Pylori, H. H., stricture 12/05/03 Colonoscopy positive polyps benign, divertics, int. hemorrhoids,  5 years 12/05/03--EGD DEXA, Osteopenia 09/05 DEXA Impr L Hip and Spine  Worse Fem Neck (now -2.4) 12/26/08 Colonoscopy Mod Divertics No Polyps (Dr Juanda Chance) 12/13/08 Cath Nonobstructive Disease Afib (Dr Juanda Chance) 06/25/09 Echo Multiple Findings 01/18/2010 Cardioversion Successful on Mutaq (Dr Juanda Chance) 02/03/2010  Family History: Last updated: Aug 11, 2010 Father: Died unknown age Mother: Died at age 64 leukemia and HTN Siblings: One brother died at age 62 from  smoking and lung cancer. Half brother died at age 10 heart problems, smoker.  Half brother A  36 Smoker  Deaf Half Sister A 60  Social History: Last updated: 08/11/2010 Marital Status: Married Lives with wife Children: 6 Occupation: Retired  Risk Factors: Alcohol Use: 0 (August 11, 2010) Caffeine Use: 1 (Aug 11, 2010) Exercise: yes (August 11, 2010)  Risk Factors: Smoking Status: never (2010/08/11) Passive Smoke Exposure: no (2010/08/11)  Family History: Father: Died unknown age Mother: Died at age 68 leukemia and HTN Siblings: One brother died at age 47 from  smoking and lung cancer. Half brother died at age 74 heart problems, smoker.  Half brother A  41 Smoker  Deaf Half Sister A 52  Social History: Marital Status: Married Lives with wife Children: 6 Occupation: Retired Caffeine  use/day:  1  Review of Systems General:  Complains of weakness; denies chills, fatigue, fever, sweats, and weight loss; occas. Eyes:  Denies blurring, discharge, and eye pain. ENT:  Complains of decreased hearing; denies earache and ringing in ears; decreased. CV:  Denies chest pain or discomfort, shortness of breath with exertion, swelling of feet, and swelling of hands; swelling resolved. Resp:  Denies cough, shortness of breath, and wheezing. GI:  Denies abdominal pain, bloody stools, change in bowel habits, constipation, dark tarry stools, diarrhea, indigestion, loss of appetite, nausea, vomiting, vomiting blood, and yellowish skin color. GU:  Denies discharge, dysuria, nocturia, and urinary frequency. MS:  Complains of cramps; denies joint pain, low back pain, muscle weakness, and stiffness; rare cramps. Derm:  Denies dryness, itching, and rash. Neuro:  Denies numbness, poor balance, tingling, and tremors.  Physical Exam  General:  Well-developed,well-nourished,in no acute distress; alert,appropriate and cooperative throughout examination Head:  Normocephalic and atraumatic without obvious abnormalities. No apparent alopecia, slight male pattern .balding. Eyes:  Conjunctiva clear bilaterally.  Ears:  External ear exam shows no significant lesions  or deformities.  Otoscopic examination reveals clear canals, tympanic membranes are intact bilaterally without bulging, retraction, inflammation or discharge. Hearing is grossly normal bilaterally. Nose:  External nasal examination shows no deformity or inflammation. Nasal mucosa are pink and moist without lesions or exudates. Mouth:  Oral mucosa and oropharynx without lesions or exudates.  Teeth in good repair. Neck:  No deformities, masses, or tenderness noted. Chest Wall:  No deformities, masses, tenderness or gynecomastia noted. Breasts:  No masses or gynecomastia noted Lungs:  Normal respiratory effort, chest expands symmetrically. Lungs  are clear to auscultation, no crackles or wheezes. Heart:  Well comntrolled rate with irregular rhythm. S1 and S2 normal without gallop, murmur, click, rub or other extra sounds. Carotids silent. Abdomen:  Bowel sounds positive,abdomen soft and non-tender without masses, organomegaly or hernias noted. Rectal:  No external abnormalities noted. Normal sphincter tone. No rectal masses or tenderness. G neg. Genitalia:  Testes bilaterally descended without nodularity, tenderness or masses. No scrotal masses or lesions. No penis lesions or urethral discharge. Prostate:  Prostate gland firm and smooth, no enlargement, nodularity, tenderness, mass, asymmetry or induration. 20-30gms. Msk:  No deformity or scoliosis noted of thoracic or lumbar spine.  Mild thoracic kyphosis noted. Pulses:  R and L carotid,radial,femoral,dorsalis pedis and posterior tibial pulses are full and equal bilaterally Extremities:  No clubbing, cyanosis or deformity noted with normal full range of motion of all joints.  1-2+ edema left foot, right nml Neurologic:  No cranial nerve deficits noted. Station and gait are normal. Sensory, motor and coordinative functions appear intact. Skin:  Intact without suspicious lesions or rashes except scattered benign moles. Cervical Nodes:  No lymphadenopathy noted Inguinal Nodes:  No significant adenopathy Psych:  Cognition and judgment appear intact. Alert and cooperative with normal attention span and concentration. No apparent delusions, illusions, hallucinations   Impression & Recommendations:  Problem # 1:  UNSPECIFIED VITAMIN D DEFICIENCY (ICD-268.9) Assessment Improved Therapeutic, cont curr replacement.  Problem # 2:  EDEMA (ICD-782.3) Assessment: Improved Better than previously, cont curr medical regimen. His updated medication list for this problem includes:    Furosemide 40 Mg Tabs (Furosemide) .Marland Kitchen... Take 1 and 1/2 tablets in the morning and one tablet in the  afternoon.  Problem # 3:  SCREENING FOR MALIGNANT NEOPLASM, PROSTATE (ICD-V76.44) Assessment: Unchanged Stable PSA and exam.  Problem # 4:  ANEMIA, IRON DEFICIENCY (ICD-280.9) Assessment: Unchanged Iron stores adequate. Cont curr replacement. His updated medication list for this problem includes:    Iron 325 (65 Fe) Mg Tabs (Ferrous sulfate) .Marland Kitchen... Take one by mouth twice a day  Hgb: 11.1 (07/23/2010)   Hct: 32.1 (07/23/2010)   Platelets: 163.0 (07/23/2010) RBC: 3.29 (07/23/2010)   RDW: 13.3 (07/23/2010)   WBC: 4.1 (07/23/2010) MCV: 97.5 (07/23/2010)   MCHC: 34.5 (07/23/2010) Ferritin: 84.5 (02/26/2008) Iron: 71 (07/23/2010)   B12: 348 (07/23/2010)   TSH: 3.20 (07/23/2010)  Problem # 5:  B12 DEFICIENCY (ICD-266.2) Assessment: Unchanged B12 adequate. Cont replacement.  Problem # 6:  HYPERCHOLESTEROLEMIA (236 LDL 170) (ICD-272.0) Assessment: Unchanged Great control. Cont meds. His updated medication list for this problem includes:    Simvastatin 80 Mg Tabs (Simvastatin) ..... One tab by mouth at night  Labs Reviewed: SGOT: 34 (07/23/2010)   SGPT: 23 (07/23/2010)   HDL:53.20 (07/23/2010), 34.60 (02/11/2010)  LDL:79 (07/23/2010), 112 (96/29/5284)  Chol:148 (07/23/2010), 162 (02/11/2010)  Trig:81.0 (07/23/2010), 77.0 (02/11/2010)  Problem # 7:  DIVERTICULOSIS, COLON (ICD-562.10) Assessment: Unchanged Discussed being seen for LLQ discomfort.  Problem # 8:  HYPERTENSION (ICD-401.9) Assessment: Unchanged Stable. His updated medication list for this problem includes:    Cardura 8 Mg Tabs (Doxazosin mesylate) .Marland Kitchen... Take 1/2 tab  by mouth daily    Ramipril 10 Mg Caps (Ramipril) .Marland Kitchen... Take 1 capsule by mouth twice a day    Furosemide 40 Mg Tabs (Furosemide) .Marland Kitchen... Take 1 and 1/2 tablets in the morning and one tablet in the afternoon.  BP today: 120/70 Prior BP: 106/63 (03/26/2010)  Labs Reviewed: K+: 3.9 (07/23/2010) Creat: : 1.5 (07/23/2010)   Chol: 148 (07/23/2010)   HDL: 53.20  (07/23/2010)   LDL: 79 (07/23/2010)   TG: 81.0 (07/23/2010)  Complete Medication List: 1)  Cardura 8 Mg Tabs (Doxazosin mesylate) .... Take 1/2 tab  by mouth daily 2)  Iron 325 (65 Fe) Mg Tabs (Ferrous sulfate) .... Take one by mouth twice a day 3)  Simvastatin 80 Mg Tabs (Simvastatin) .... One tab by mouth at night 4)  Nitrolingual 0.4 Mg/spray Soln (Nitroglycerin) .... Use as needed as directed 5)  Fosamax 10 Mg Tabs (Alendronate sodium) .Marland Kitchen.. 1 daily by mouth 6)  Ramipril 10 Mg Caps (Ramipril) .... Take 1 capsule by mouth twice a day 7)  Furosemide 40 Mg Tabs (Furosemide) .... Take 1 and 1/2 tablets in the morning and one tablet in the afternoon. 8)  Vitamin D3 1000 Unit Caps (Cholecalciferol) .Marland Kitchen.. 1 daily by mouth 9)  Warfarin Sodium 5 Mg Tabs (Warfarin sodium) .... Use as directed by anticoagulation clinic 10)  Buffered Aspirin 325 Mg Tabs (Aspirin buf(cacarb-mgcarb-mgo)) .... Take one by mouth daily 11)  Multivitamins Tabs (Multiple vitamin) .... Take one by mouth daily 12)  Multaq 400 Mg Tabs (Dronedarone hcl) .... Take one tab by mouth two times a day 13)  Potassium Chloride Crys Cr 20 Meq Cr-tabs (Potassium chloride crys cr) .... Take one tablet by mouth daily Prescriptions: NITROLINGUAL 0.4 MG/SPRAY SOLN (NITROGLYCERIN) Use as needed as directed  #4.9 x 0   Entered and Authorized by:   Shaune Leeks MD   Signed by:   Shaune Leeks MD on 07/30/2010   Method used:   Electronically to        [facility] [id number] (retail)       [address] [place], [place]  [address]       Ph: [phone]       Fax: [phone]   RxID:   [id number]   Current Allergies (reviewed today): ! PCN NAPROSYN (NAPROXEN)

## 2010-11-17 NOTE — Medication Information (Signed)
Summary: rov/sl  Anticoagulant Therapy  Managed by: Cloyde Reams, RN, BSN Referring MD: Charlies Constable, MD PCP: Shaune Leeks MD Supervising MD: Myrtis Ser MD, Tinnie Gens Indication 1: Atrial Fibrillation Lab Used: [facility] [place] Site: [address] INR POC 1.3 INR RANGE 2-3  Dietary changes: no    Health status changes: no    Bleeding/hemorrhagic complications: no    Recent/future hospitalizations: no    Any changes in medication regimen? no    Recent/future dental: no  Any missed doses?: no       Is patient compliant with meds? yes       Allergies: 1)  ! Pcn 2)  Naprosyn (Naproxen)  Anticoagulation Management History:      The patient is taking warfarin and comes in today for a routine follow up visit.  Positive risk factors for bleeding include an age of 37 years or older and presence of serious comorbidities.  The bleeding index is 'intermediate risk'.  Positive CHADS2 values include History of CHF, History of HTN, and Age > 61 years old.  His last INR was 1.0 RATIO.  Anticoagulation responsible provider: Myrtis Ser MD, Tinnie Gens.  INR POC: 1.3.  Cuvette Lot#: [id number].  Exp: 08/2011.    Anticoagulation Management Assessment/Plan:      The patient's current anticoagulation dose is Warfarin sodium 5 mg tabs: Use as directed by Anticoagulation Clinic.  The target INR is 2.0-3.0.  The next INR is due 08/14/2010.  Anticoagulation instructions were given to patient.  Results were reviewed/authorized by Cloyde Reams, RN, BSN.  He was notified by Weston Brass PharmD.         Prior Anticoagulation Instructions: INR 1.4  Tomorrow, Tuesday, October 11th, take Coumadin 2 tabs (10 mg). Then, take Coumadin 1.5 tabs (7.5 mg) every day.  Return to clinic in 2 weeks.   Current Anticoagulation Instructions: INR 1.3  Start taking 1.5 tablets daily except 2 tablets on Wednesdays and Saturdays.  Recheck in 10 days.

## 2010-11-17 NOTE — Medication Information (Signed)
Summary: [facility] Reed  Anticoagulant Therapy  Managed by: Bethena Midget, RN, BSN Referring MD: Charlies Constable, MD PCP: Shaune Leeks MD Supervising MD: Riley Kill MD, Maisie Fus Indication 1: Atrial Fibrillation Lab Used: [facility] [place] Site: [address] INR POC 2.1 INR RANGE 2-3  Dietary changes: no    Health status changes: yes       Details: weakness, SOB  Bleeding/hemorrhagic complications: no    Recent/future hospitalizations: no    Any changes in medication regimen? yes       Details: Multaq BID started 12/10/09   Recent/future dental: no  Any missed doses?: no       Is patient compliant with meds? yes      Comments: Pending DCCV maybe   Allergies: 1)  ! Pcn 2)  Naprosyn (Naproxen)  Anticoagulation Management History:      The patient is taking warfarin and comes in today for a routine follow up visit.  Positive risk factors for bleeding include an age of 75 years or older.  The bleeding index is 'intermediate risk'.  Positive CHADS2 values include History of CHF, History of HTN, and Age > 45 years old.  His last INR was 1.0 RATIO.  Anticoagulation responsible provider: Riley Kill MD, Maisie Fus.  INR POC: 2.1.  Cuvette Lot#: [id number].  Exp: 02/2011.    Anticoagulation Management Assessment/Plan:      The patient's current anticoagulation dose is Warfarin sodium 5 mg tabs: Use as directed by Anticoagulation Clinic.  The target INR is 2.0-3.0.  The next INR is due 12/24/2009.  Anticoagulation instructions were given to patient.  Results were reviewed/authorized by Bethena Midget, RN, BSN.  He was notified by Bethena Midget, RN, BSN.         Prior Anticoagulation Instructions: INR 1.6  Take 1.5 tablets today, then start taking 1 tablet daily except 1/2 tablet on Tuesdays and Saturdays.  Recheck in 1 week.    Current Anticoagulation Instructions: INR 2.1 Change dose to 1 pill everyday except 1/2 pill on Tuesdays. Recheck in one week.  Poss. DCCV.

## 2010-11-17 NOTE — Medication Information (Signed)
Summary: rov/ewj  Anticoagulant Therapy  Managed by: Weston Brass, PharmD Referring MD: Charlies Constable, MD PCP: Shaune Leeks MD Supervising MD: Jens Som MD, Arlys John Indication 1: Atrial Fibrillation Lab Used: [facility] [place] Site: [address] INR POC 1.6 INR RANGE 2-3  Dietary changes: no    Health status changes: no    Bleeding/hemorrhagic complications: no    Recent/future hospitalizations: no    Any changes in medication regimen? no    Recent/future dental: no  Any missed doses?: no       Is patient compliant with meds? yes       Allergies: 1)  ! Pcn 2)  Naprosyn (Naproxen)  Anticoagulation Management History:      The patient is taking warfarin and comes in today for a routine follow up visit.  Positive risk factors for bleeding include an age of 75 years or older and presence of serious comorbidities.  The bleeding index is 'intermediate risk'.  Positive CHADS2 values include History of CHF, History of HTN, and Age > 60 years old.  His last INR was 1.0 RATIO.  Anticoagulation responsible provider: Jens Som MD, Arlys John.  INR POC: 1.6.  Cuvette Lot#: [id number].  Exp: 08/2011.    Anticoagulation Management Assessment/Plan:      The patient's current anticoagulation dose is Warfarin sodium 5 mg tabs: Use as directed by Anticoagulation Clinic.  The target INR is 2.0-3.0.  The next INR is due 09/11/2010.  Anticoagulation instructions were given to patient.  Results were reviewed/authorized by Weston Brass, PharmD.  He was notified by Weston Brass PharmD.         Prior Anticoagulation Instructions: INR 1.5  Start taking 2 tablets daily except 1.5 tablets on Sundays, Tuesdays, and Thursdays.  Recheck in 1 week.    Current Anticoagulation Instructions: INR 1.6  Take an extra tablet today then increase dose to 2 tablets every day excpet 1 1/2 tablets on Thursday.  Recheck INR in 1 week.

## 2010-11-17 NOTE — Medication Information (Signed)
Summary: rov/ewj  Anticoagulant Therapy  Managed by: Weston Brass, PharmD Referring MD: Charlies Constable, MD PCP: Shaune Leeks MD Supervising MD: Juanda Chance MD, Mekenna Finau Indication 1: Atrial Fibrillation Lab Used: [facility] [place] Site: [address] INR POC 3.6 INR RANGE 2-3  Dietary changes: no    Health status changes: no    Bleeding/hemorrhagic complications: yes       Details: small nosebleeds lately but none severe   Recent/future hospitalizations: no    Any changes in medication regimen? no    Recent/future dental: no  Any missed doses?: no       Is patient compliant with meds? yes      Comments: Took 1 tablet today rather than 1/2 tablet  Allergies: 1)  ! Pcn 2)  Naprosyn (Naproxen)  Anticoagulation Management History:      The patient is taking warfarin and comes in today for a routine follow up visit.  Positive risk factors for bleeding include an age of 23 years or older and presence of serious comorbidities.  The bleeding index is 'intermediate risk'.  Positive CHADS2 values include History of CHF, History of HTN, and Age > 53 years old.  His last INR was 1.0 RATIO.  Anticoagulation responsible provider: Juanda Chance MD, Smitty Cords.  INR POC: 3.6.  Cuvette Lot#: [id number].  Exp: 02/2011.    Anticoagulation Management Assessment/Plan:      The patient's current anticoagulation dose is Warfarin sodium 5 mg tabs: Use as directed by Anticoagulation Clinic.  The target INR is 2.0-3.0.  The next INR is due 02/16/2010.  Anticoagulation instructions were given to patient.  Results were reviewed/authorized by Weston Brass, PharmD.  He was notified by Weston Brass PharmD.         Prior Anticoagulation Instructions: INR 3.7  Skip today's dosage of coumadin, then start taking 1 tablet daily except 1/2 tablet on Mondays.  Recheck in 2 weeks.     Current Anticoagulation Instructions: INR 3.6  Skip tomorrow's dose of Coumadin then decrease dose to 1 tablet every day except  1/2 tablet on Monday and Friday

## 2010-11-17 NOTE — Medication Information (Signed)
Summary: rov/eh  Anticoagulant Therapy  Managed by: Cloyde Reams, RN, BSN Referring MD: Charlies Constable, MD PCP: Shaune Leeks MD Supervising MD: Jens Som MD, Arlys John Indication 1: Atrial Fibrillation Lab Used: [facility]  Site: [address] INR POC 2.1 INR RANGE 2-3  Dietary changes: no    Health status changes: no    Bleeding/hemorrhagic complications: no    Recent/future hospitalizations: no    Any changes in medication regimen? no    Recent/future dental: no  Any missed doses?: no       Is patient compliant with meds? yes       Allergies (verified): 1)  ! Pcn 2)  Naprosyn (Naproxen)  Anticoagulation Management History:      The patient is taking warfarin and comes in today for a routine follow up visit.  Positive risk factors for bleeding include an age of 75 years or older.  The bleeding index is 'intermediate risk'.  Positive CHADS2 values include History of HTN and Age > 39 years old.  His last INR was 1.0 RATIO.  Anticoagulation responsible provider: Jens Som MD, Arlys John.  INR POC: 2.1.  Cuvette Lot#: [id number].  Exp: 01/2011.    Anticoagulation Management Assessment/Plan:      The patient's current anticoagulation dose is Warfarin sodium 5 mg tabs: Use as directed by Anticoagulation Clinic.  The target INR is 2.0-3.0.  The next INR is due 12/24/2009.  Anticoagulation instructions were given to patient.  Results were reviewed/authorized by Cloyde Reams, RN, BSN.  He was notified by Cloyde Reams RN.         Prior Anticoagulation Instructions: INR 2.1  Continue same dose of 1 tablet daily except 0.5 tablet on Tuesdays, Thursdays, and Saturdays. Recheck in 4 weeks.  Current Anticoagulation Instructions: INR 2.1  Continue on same dosage 1 tablet daily except 1/2 tablet on Tuesdays, Thursdays, and Saturdays.  Recheck in 4 weeks.

## 2010-11-17 NOTE — Letter (Signed)
Summary: Handout Printed  Printed Handout:  - Coumadin Instructions-w/out Meds 

## 2010-11-17 NOTE — Medication Information (Signed)
Summary: rov/tm  Anticoagulant Therapy  Managed by: Bethena Midget, RN, BSN Referring MD: Charlies Constable, MD PCP: Shaune Leeks MD Supervising MD: Shirlee Latch MD, Reyn Faivre Indication 1: Atrial Fibrillation Lab Used: [facility] [place] Site: [address] INR POC 1.3 INR RANGE 2-3  Dietary changes: no    Health status changes: no    Bleeding/hemorrhagic complications: no    Recent/future hospitalizations: no    Any changes in medication regimen? no    Recent/future dental: no  Any missed doses?: no       Is patient compliant with meds? yes       Allergies: 1)  ! Pcn 2)  Naprosyn (Naproxen)  Anticoagulation Management History:      The patient is taking warfarin and comes in today for a routine follow up visit.  Positive risk factors for bleeding include an age of 75 years or older and presence of serious comorbidities.  The bleeding index is 'intermediate risk'.  Positive CHADS2 values include History of CHF, History of HTN, and Age > 59 years old.  His last INR was 1.0 RATIO.  Anticoagulation responsible provider: Shirlee Latch MD, Janille Draughon.  INR POC: 1.3.  Cuvette Lot#: [id number].  Exp: 08/2011.    Anticoagulation Management Assessment/Plan:      The patient's current anticoagulation dose is Warfarin sodium 5 mg tabs: Use as directed by Anticoagulation Clinic.  The target INR is 2.0-3.0.  The next INR is due 07/27/2010.  Anticoagulation instructions were given to patient.  Results were reviewed/authorized by Bethena Midget, RN, BSN.  He was notified by Kennieth Francois.         Prior Anticoagulation Instructions: INR 1.3 Today take 2 pills, on tomorrow take 2 pills then change dose to 1.5 pills everyday except 1 pill on Mondays, Wednesdays and Fridays per pharm D. Recheck in 10 days.   Current Anticoagulation Instructions: INR 1.3  Take two tablets today, then increase dose to one and one-half tablets every day except for one tablet on Mondays.  Recheck in ten days.

## 2010-11-17 NOTE — Medication Information (Signed)
Summary: rov/tm  Anticoagulant Therapy  Managed by: Bethena Midget, RN, BSN Referring MD: Charlies Constable, MD PCP: Shaune Leeks MD Supervising MD: Antoine Poche MD, Fayrene Fearing Indication 1: Atrial Fibrillation Lab Used: [facility] [place] Site: [address] INR POC 2.2 INR RANGE 2-3  Dietary changes: no    Health status changes: no    Bleeding/hemorrhagic complications: no    Recent/future hospitalizations: no    Any changes in medication regimen? no    Recent/future dental: no  Any missed doses?: no       Is patient compliant with meds? yes       Allergies: 1)  ! Pcn 2)  Naprosyn (Naproxen)  Anticoagulation Management History:      The patient is taking warfarin and comes in today for a routine follow up visit.  Positive risk factors for bleeding include an age of 75 years or older and presence of serious comorbidities.  The bleeding index is 'intermediate risk'.  Positive CHADS2 values include History of CHF, History of HTN, and Age > 84 years old.  His last INR was 1.0 RATIO.  Anticoagulation responsible provider: Antoine Poche MD, Fayrene Fearing.  INR POC: 2.2.  Cuvette Lot#: [id number].  Exp: 07/2011.    Anticoagulation Management Assessment/Plan:      The patient's current anticoagulation dose is Warfarin sodium 5 mg tabs: Use as directed by Anticoagulation Clinic.  The target INR is 2.0-3.0.  The next INR is due 05/25/2010.  Anticoagulation instructions were given to patient.  Results were reviewed/authorized by Bethena Midget, RN, BSN.  He was notified by Bethena Midget, RN, BSN.         Prior Anticoagulation Instructions: INR 1.2 Today and Saturday take 1 1/2 pills then resume to 1 pill everyday except 1 1/2 pills on Sundays and Thursdays. Recheck in 10 days.   Current Anticoagulation Instructions: INR 2.2 Continue 1 pill everyday except 1.5  pills on Sundays and Thursdays. Recheck in 3 weeks.

## 2010-11-17 NOTE — Assessment & Plan Note (Signed)
Summary: F/U per RNS re: Vytorin Tony Reed   Vital Signs:  Patient profile:   77 year old male Weight:      205.75 pounds Temp:     97.9 degrees F oral Pulse rate:   60 / minute Pulse rhythm:   regular BP sitting:   100 / 60  (left arm) Cuff size:   large  Vitals Entered By: Sydell Axon LPN (February 11, 2010 9:04 AM) CC: Follow-up to discuss Vytorin   History of Present Illness: Pt here to change Vytorin, which he has tolerated well and has given him great results, to generic. Insurance told him he could save a lot of money, saying nothing about chol control and side effects.  Problems Prior to Update: 1)  Diastolic Heart Failure, Acute On Chronic  (ICD-428.33) 2)  Atrial Fibrillation  (ICD-427.31) 3)  Cardiovascular Function Study, Abnormal  (ICD-794.30) 4)  Cad, Native Vessel  (ICD-414.01) 5)  Unspecified Vitamin D Deficiency  (ICD-268.9) 6)  Knee Sprain, Left  (ICD-844.9) 7)  Special Screening Malig Neoplasms Other Sites  (ICD-V76.49) 8)  Edema  (ICD-782.3) 9)  Screening For Malignant Neoplasm, Prostate  (ICD-V76.44) 10)  Anemia, Iron Deficiency  (ICD-280.9) 11)  Anemia Due To Chronic Blood Loss  (ICD-280.0) 12)  Hematuria W/u Neg (PETERSON)  (ICD-599.7) 13)  B12 Deficiency  (ICD-266.2) 14)  Benign Prostatic Hypertrophy, Hx of  (ICD-V13.8) 15)  Testosterone Deficiency  (ICD-257.2) 16)  Cataract, Left Eye  (ICD-366.9) 17)  Polypectomy, Hx of (DR. NELMS)  (ICD-V15.9) 18)  Gerd, H. H. in Ugi  (ICD-530.81) 19)  Hypercholesterolemia (236 LDL 170)  (ICD-272.0) 20)  Diverticulosis, Colon  (ICD-562.10) 21)  Osteopenia Hip/ Porosis Spine  (ICD-733.90) 22)  Myocardial Infarction, Hx of (INF WALL)  (ICD-412) 23)  Hypertension  (ICD-401.9)  Medications Prior to Update: 1)  Cardura 8 Mg Tabs (Doxazosin Mesylate) .... Take 1/2 Tab  By Mouth Daily 2)  Iron 325 (65 Fe) Mg Tabs (Ferrous Sulfate) .... Take One By Mouth Twice A Day 3)  Vytorin 10-20 Mg Tabs (Ezetimibe-Simvastatin) .... Take  One By Mouth At Night 4)  Nitrolingual 0.4 Mg/spray Soln (Nitroglycerin) .... Use As Needed As Directed 5)  Fosamax 10 Mg Tabs (Alendronate Sodium) .Marland Kitchen.. 1 Daily By Mouth 6)  Ramipril 10 Mg Caps (Ramipril) .... Take 1 Capsule By Mouth Twice A Day 7)  Furosemide 40 Mg Tabs (Furosemide) .... Take Two Tabs Once Daily 8)  Vitamin D3 1000 Unit Caps (Cholecalciferol) .Marland Kitchen.. 1 Daily By Mouth 9)  Warfarin Sodium 5 Mg Tabs (Warfarin Sodium) .... Use As Directed By Anticoagulation Clinic 10)  Buffered Aspirin 325 Mg Tabs (Aspirin Buf(Cacarb-Mgcarb-Mgo)) .... Take One By Mouth Daily 11)  Multivitamins  Tabs (Multiple Vitamin) .... Take One By Mouth Daily 12)  Multaq 400 Mg Tabs (Dronedarone Hcl) .... Take One Tab By Mouth Two Times A Day  Allergies: 1)  ! Pcn 2)  Naprosyn (Naproxen)  Physical Exam  General:  Well-developed,well-nourished,in no acute distress; alert,appropriate and cooperative throughout examination Head:  Normocephalic and atraumatic without obvious abnormalities. No apparent alopecia, slight male pattern .balding. Eyes:  Conjunctiva clear bilaterally.  Ears:  External ear exam shows no significant lesions or deformities.  Otoscopic examination reveals clear canals, tympanic membranes are intact bilaterally without bulging, retraction, inflammation or discharge. Hearing is grossly normal bilaterally. Nose:  External nasal examination shows no deformity or inflammation. Nasal mucosa are pink and moist without lesions or exudates. Mouth:  Oral mucosa and oropharynx without lesions or exudates.  Teeth in good repair. Neck:  No deformities, masses, or tenderness noted. Lungs:  Normal respiratory effort, chest expands symmetrically. Lungs are clear to auscultation, no crackles or wheezes. Heart:  Well comntrolled rate with irregular rhythm. S1 and S2 normal without gallop, murmur, click, rub or other extra sounds. Carotids silent.   Impression & Recommendations:  Problem # 1:   HYPERCHOLESTEROLEMIA (236 LDL 170) (ICD-272.0)  Will recheck for baseline study today and then see instructions.  Change from Vytorin 10-20 to Simvastatin 80 and recheck. His updated medication list for this problem includes:    Simvastatin 80 Mg Tabs (Simvastatin) ..... One tab by mouth at night  Orders: Venipuncture (44010) TLB-ALT (SGPT) (84460-ALT) TLB-AST (SGOT) (84450-SGOT) TLB-Lipid Panel (80061-LIPID)  Labs Reviewed: SGOT: 38 (01/28/2010)   SGPT: 29 (01/28/2010)   HDL:45.30 (01/28/2010), 48.70 (04/23/2009)  LDL:61 (01/28/2010), 69 (04/23/2009)  Chol:124 (01/28/2010), 131 (04/23/2009)  Trig:87.0 (01/28/2010), 66.0 (04/23/2009)  Complete Medication List: 1)  Cardura 8 Mg Tabs (Doxazosin mesylate) .... Take 1/2 tab  by mouth daily 2)  Iron 325 (65 Fe) Mg Tabs (Ferrous sulfate) .... Take one by mouth twice a day 3)  Simvastatin 80 Mg Tabs (Simvastatin) .... One tab by mouth at night 4)  Nitrolingual 0.4 Mg/spray Soln (Nitroglycerin) .... Use as needed as directed 5)  Fosamax 10 Mg Tabs (Alendronate sodium) .Marland Kitchen.. 1 daily by mouth 6)  Ramipril 10 Mg Caps (Ramipril) .... Take 1 capsule by mouth twice a day 7)  Furosemide 40 Mg Tabs (Furosemide) .... Take two tabs once daily 8)  Vitamin D3 1000 Unit Caps (Cholecalciferol) .Marland Kitchen.. 1 daily by mouth 9)  Warfarin Sodium 5 Mg Tabs (Warfarin sodium) .... Use as directed by anticoagulation clinic 10)  Buffered Aspirin 325 Mg Tabs (Aspirin buf(cacarb-mgcarb-mgo)) .... Take one by mouth daily 11)  Multivitamins Tabs (Multiple vitamin) .... Take one by mouth daily 12)  Multaq 400 Mg Tabs (Dronedarone hcl) .... Take one tab by mouth two times a day  Patient Instructions: 1)  Do SGOT SGPT 6 weeks. 272.0 2)  Call in late May for appt to see me in Oct. Prescriptions: SIMVASTATIN 80 MG TABS (SIMVASTATIN) one tab by mouth at night  #30 x 12   Entered and Authorized by:   Shaune Leeks MD   Signed by:   Shaune Leeks MD on 02/11/2010    Method used:   Electronically to        [facility] [id number] (retail)       [address] [place], [place]  [address]       Ph: [phone]       Fax: [phone]   RxID:   [id number]   Current Allergies (reviewed today): ! PCN NAPROSYN (NAPROXEN)

## 2010-11-17 NOTE — Progress Notes (Signed)
Summary: needs to change from vytorin  Phone Note From Pharmacy   Caller: [facility] [address] [place]. [phone] Summary of Call: Pharmacy sent note that pt's copay on vytorin is now $85.00, an increase from $6.30.  Pt doesnt want to pay the higher amount and pharmacy is asking if something else can be called in. Initial call taken by: Lowella Petties CMA,  January 27, 2010 3:35 PM  Follow-up for Phone Call        Have pt get chol profile while still on Vytorin and then see me a day or two later please. Follow-up by: Shaune Leeks MD,  January 27, 2010 4:40 PM  Additional Follow-up for Phone Call Additional follow up Details #1::        Advised pt, lab appt made for tomorrow, advised pt he will need to schedule appt with Dr Hetty Ely for thursday or monday- he will do that tomorrow. Additional Follow-up by: Lowella Petties CMA,  January 27, 2010 4:58 PM

## 2010-11-17 NOTE — Medication Information (Signed)
Summary: rov.mp  Anticoagulant Therapy  Managed by: Cloyde Reams, RN, BSN Referring MD: Charlies Constable, MD PCP: Shaune Leeks MD Supervising MD: Jens Som MD, Arlys John Indication 1: Atrial Fibrillation Lab Used: [facility] [place] Site: [address] INR POC 3.7 INR RANGE 2-3  Dietary changes: no    Health status changes: no    Bleeding/hemorrhagic complications: yes       Details: One episode of nosebleed since last visit, easily controlled.    Recent/future hospitalizations: no    Any changes in medication regimen? no    Recent/future dental: no  Any missed doses?: no       Is patient compliant with meds? yes       Allergies (verified): 1)  ! Pcn 2)  Naprosyn (Naproxen)  Anticoagulation Management History:      The patient is taking warfarin and comes in today for a routine follow up visit.  Positive risk factors for bleeding include an age of 75 years or older and presence of serious comorbidities.  The bleeding index is 'intermediate risk'.  Positive CHADS2 values include History of CHF, History of HTN, and Age > 57 years old.  His last INR was 1.0 RATIO.  Anticoagulation responsible provider: Jens Som MD, Arlys John.  INR POC: 3.7.  Cuvette Lot#: [id number].  Exp: 02/2011.    Anticoagulation Management Assessment/Plan:      The patient's current anticoagulation dose is Warfarin sodium 5 mg tabs: Use as directed by Anticoagulation Clinic.  The target INR is 2.0-3.0.  The next INR is due 02/02/2010.  Anticoagulation instructions were given to patient.  Results were reviewed/authorized by Cloyde Reams, RN, BSN.  He was notified by Cloyde Reams RN.         Prior Anticoagulation Instructions: INR 4.1  Skip 1 day then resume 1 tab (5 mg) daily.  Recheck in 1 week.    Current Anticoagulation Instructions: INR 3.7  Skip today's dosage of coumadin, then start taking 1 tablet daily except 1/2 tablet on Mondays.  Recheck in 2 weeks.

## 2010-11-17 NOTE — Medication Information (Signed)
Summary: rov/tm  Anticoagulant Therapy  Managed by: Eda Keys, PharmD Referring MD: Charlies Constable, MD PCP: Shaune Leeks MD Supervising MD: Myrtis Ser MD, Tinnie Gens Indication 1: Atrial Fibrillation Lab Used: [facility] [place] Site: [address] INR POC 2.6 INR RANGE 2-3  Dietary changes: no    Health status changes: no    Bleeding/hemorrhagic complications: no    Recent/future hospitalizations: no    Any changes in medication regimen? no    Recent/future dental: no  Any missed doses?: no       Is patient compliant with meds? yes      Comments: Pending cardioversion.  Allergies: 1)  ! Pcn 2)  Naprosyn (Naproxen)  Anticoagulation Management History:      The patient is taking warfarin and comes in today for a routine follow up visit.  Positive risk factors for bleeding include an age of 29 years or older.  The bleeding index is 'intermediate risk'.  Positive CHADS2 values include History of CHF, History of HTN, and Age > 11 years old.  His last INR was 1.0 RATIO.  Anticoagulation responsible provider: Myrtis Ser MD, Tinnie Gens.  INR POC: 2.6.  Cuvette Lot#: [id number].  Exp: 02/2011.    Anticoagulation Management Assessment/Plan:      The patient's current anticoagulation dose is Warfarin sodium 5 mg tabs: Use as directed by Anticoagulation Clinic.  The target INR is 2.0-3.0.  The next INR is due 01/06/2010.  Anticoagulation instructions were given to patient.  Results were reviewed/authorized by Eda Keys, PharmD.  He was notified by Eda Keys.         Prior Anticoagulation Instructions: Pending DCCV INR 1.7 Today take extra 1/2 pill then change dose to 1 pill everyday. Recheck in one week.   Current Anticoagulation Instructions: INR 2.6  Continue taking 1 tablet every day.  Return to clinic in 1 week.

## 2010-11-17 NOTE — Medication Information (Signed)
Summary: rov/ewj  Anticoagulant Therapy  Managed by: Bethena Midget, RN, BSN Referring MD: Charlies Constable, MD PCP: Shaune Leeks MD Supervising MD: Eden Emms MD, Theron Arista Indication 1: Atrial Fibrillation Lab Used: [facility] [place] Site: [address] INR POC 1.7 INR RANGE 2-3  Dietary changes: no    Health status changes: no    Bleeding/hemorrhagic complications: yes       Details: Occ. scant amt of blood in stool in BM.  Recent/future hospitalizations: no    Any changes in medication regimen? no    Recent/future dental: no  Any missed doses?: no       Is patient compliant with meds? yes      Comments: Pending DCCV  Allergies: 1)  ! Pcn 2)  Naprosyn (Naproxen)  Anticoagulation Management History:      The patient is taking warfarin and comes in today for a routine follow up visit.  Positive risk factors for bleeding include an age of 75 years or older.  The bleeding index is 'intermediate risk'.  Positive CHADS2 values include History of CHF, History of HTN, and Age > 75 years old.  His last INR was 1.0 RATIO.  Anticoagulation responsible provider: Eden Emms MD, Theron Arista.  INR POC: 1.7.  Cuvette Lot#: [id number].  Exp: 02/2011.    Anticoagulation Management Assessment/Plan:      The patient's current anticoagulation dose is Warfarin sodium 5 mg tabs: Use as directed by Anticoagulation Clinic.  The target INR is 2.0-3.0.  The next INR is due 12/30/2009.  Anticoagulation instructions were given to patient.  Results were reviewed/authorized by Bethena Midget, RN, BSN.  He was notified by Bethena Midget, RN, BSN.         Prior Anticoagulation Instructions: INR 2.1 Change dose to 1 pill everyday except 1/2 pill on Tuesdays. Recheck in one week.  Poss. DCCV.   Current Anticoagulation Instructions: Pending DCCV INR 1.7 Today take extra 1/2 pill then change dose to 1 pill everyday. Recheck in one week.

## 2010-11-17 NOTE — Medication Information (Signed)
Summary: rov/sl  Anticoagulant Therapy  Managed by: Cloyde Reams, RN, BSN Referring MD: Charlies Constable, MD PCP: Shaune Leeks MD Supervising MD: Juanda Chance MD, Jermon Chalfant Indication 1: Atrial Fibrillation Lab Used: [facility] [place] Site: [address] INR POC 1.5 INR RANGE 2-3  Dietary changes: no    Health status changes: no    Bleeding/hemorrhagic complications: no    Recent/future hospitalizations: no    Any changes in medication regimen? no    Recent/future dental: no  Any missed doses?: no       Is patient compliant with meds? yes      Comments: Pt brought medications to office visit, medication list updated and verified, pt does have warfarin 5mg  tablets.   Allergies: 1)  ! Pcn 2)  Naprosyn (Naproxen)  Anticoagulation Management History:      The patient is taking warfarin and comes in today for a routine follow up visit.  Positive risk factors for bleeding include an age of 75 years or older and presence of serious comorbidities.  The bleeding index is 'intermediate risk'.  Positive CHADS2 values include History of CHF, History of HTN, and Age > 72 years old.  His last INR was 1.0 RATIO.  Anticoagulation responsible provider: Juanda Chance MD, Smitty Cords.  INR POC: 1.5.  Cuvette Lot#: [id number].  Exp: 08/2011.    Anticoagulation Management Assessment/Plan:      The patient's current anticoagulation dose is Warfarin sodium 5 mg tabs: Use as directed by Anticoagulation Clinic.  The target INR is 2.0-3.0.  The next INR is due 08/28/2010.  Anticoagulation instructions were given to patient.  Results were reviewed/authorized by Cloyde Reams, RN, BSN.  He was notified by Cloyde Reams RN.         Prior Anticoagulation Instructions: INR 1.1  Tomorrow, Saturday, October 28th, take Coumadin 3 tabs (15 mg). Sunday, October 29th, take Coumadin 2 tabs (10 mg). Then continue regular schedule of Coumadin 1.5 tabs (7.5 mg) on all days except Coumadin 2 tabs (10 mg) on Wed and  Sat. Return to clinic in 1 week. PLEASE BRING ALL MEDICATIONS WITH YOU NEXT WEEK!  Current Anticoagulation Instructions: INR 1.5  Start taking 2 tablets daily except 1.5 tablets on Sundays, Tuesdays, and Thursdays.  Recheck in 1 week.   Prescriptions: WARFARIN SODIUM 5 MG TABS (WARFARIN SODIUM) Use as directed by Anticoagulation Clinic  #60 x 1   Entered by:   Erika Johnson RN   Authorized by:   Naturi Alarid Rogers Massai Hankerson, MD, FACC   Signed by:   Erika Johnson RN on 08/21/2010   Method used:   Electronically to        [facility] [id number] (retail)       [address]       [place], [place]  [address]       Ph: [phone]       Fax: [phone]   RxID:   [id number]

## 2010-11-17 NOTE — Medication Information (Signed)
Summary: rov/eac  Anticoagulant Therapy  Managed by: Shelby Dubin, PharmD, BCPS, CPP Referring MD: Charlies Constable, MD PCP: Shaune Leeks MD Supervising MD: Gala Romney MD, Reuel Boom Indication 1: Atrial Fibrillation Lab Used: [facility] [place] Site: [address] INR POC 4.1 INR RANGE 2-3  Dietary changes: no    Health status changes: no    Bleeding/hemorrhagic complications: no    Recent/future hospitalizations: no    Any changes in medication regimen? no    Recent/future dental: no  Any missed doses?: no       Is patient compliant with meds? yes       Allergies (verified): 1)  ! Pcn 2)  Naprosyn (Naproxen)  Anticoagulation Management History:      The patient is taking warfarin and comes in today for a routine follow up visit.  Positive risk factors for bleeding include an age of 5 years or older and presence of serious comorbidities.  The bleeding index is 'intermediate risk'.  Positive CHADS2 values include History of CHF, History of HTN, and Age > 39 years old.  His last INR was 1.0 RATIO.  Anticoagulation responsible provider: Amour Trigg MD, Reuel Boom.  INR POC: 4.1.  Cuvette Lot#: [id number].  Exp: 02/2011.    Anticoagulation Management Assessment/Plan:      The patient's current anticoagulation dose is Warfarin sodium 5 mg tabs: Use as directed by Anticoagulation Clinic.  The target INR is 2.0-3.0.  The next INR is due 01/21/2010.  Anticoagulation instructions were given to patient.  Results were reviewed/authorized by Shelby Dubin, PharmD, BCPS, CPP.  He was notified by Shelby Dubin PharmD, BCPS, CPP.         Prior Anticoagulation Instructions: INR 2.5  Continue taking 1 tablet (5 mg) every day.  Return to clinic in 1 week.   Pending cardioversion.  Current Anticoagulation Instructions: INR 4.1  Skip 1 day then resume 1 tab (5 mg) daily.  Recheck in 1 week.

## 2010-11-17 NOTE — Assessment & Plan Note (Signed)
Summary: per check out   Primary Provider:  Shaune Leeks MD   History of Present Illness: Tony Reed is 77 years old and return for management of atrial fibrillation, CAD, and CHF. He had an inferior MI in 2003 treated with a bare-metal stent to the RCA and he later had a drug-eluting stent to the circumflex artery. Last fall he developed new onset atrial fibrillation and recently he underwent treatment with Multaq followedl by DC cardioversion. He has also had diastolic heart failure and at his last visit we increased his Lasix dosage. He had a baseline creatinine of 1.4 and went up to 1.8 following increased Lasix.  His other problems include hypertension, hyperlipidemia, and peripheral vascular disease.  Current Medications (verified): 1)  Cardura 8 Mg Tabs (Doxazosin Mesylate) .... Take 1/2 Tab  By Mouth Daily 2)  Iron 325 (65 Fe) Mg Tabs (Ferrous Sulfate) .... Take One By Mouth Twice A Day 3)  Simvastatin 80 Mg Tabs (Simvastatin) .... One Tab By Mouth At Night 4)  Nitrolingual 0.4 Mg/spray Soln (Nitroglycerin) .... Use As Needed As Directed 5)  Fosamax 10 Mg Tabs (Alendronate Sodium) .Marland Kitchen.. 1 Daily By Mouth 6)  Ramipril 10 Mg Caps (Ramipril) .... Take 1 Capsule By Mouth Twice A Day 7)  Furosemide 40 Mg Tabs (Furosemide) .... Take 1 and 1/2 Tablets in The Morning and One Tablet in The Afternoon. 8)  Vitamin D3 1000 Unit Caps (Cholecalciferol) .Marland Kitchen.. 1 Daily By Mouth 9)  Warfarin Sodium 5 Mg Tabs (Warfarin Sodium) .... Use As Directed By Anticoagulation Clinic 10)  Buffered Aspirin 325 Mg Tabs (Aspirin Buf(Cacarb-Mgcarb-Mgo)) .... Take One By Mouth Daily 11)  Multivitamins  Tabs (Multiple Vitamin) .... Take One By Mouth Daily 12)  Multaq 400 Mg Tabs (Dronedarone Hcl) .... Take One Tab By Mouth Two Times A Day 13)  Potassium Chloride Crys Cr 20 Meq Cr-Tabs (Potassium Chloride Crys Cr) .... Take One Tablet By Mouth Daily  Allergies (verified): 1)  ! Pcn 2)  Naprosyn (Naproxen)  Past  History:  Past Medical History: Reviewed history from 06/12/2009 and no changes required. Hypertension Myocardial infarction, hx of Osteopenia Osteoporosis Diverticulosis, colon GERD 1. Coronary artery disease status post diaphragmatic wall infarction     in 2003, treated with stenting to the right coronary with a bare-     metal stent with subsequent elective stenting of the circumflex     artery with a Cypher stent with nonobstructive disease at last     catheterization in 2008. 2. Hypertension, now under good control. 3. Hyperlipidemia. 4. Leg pain and decreased pulses consistent with peripheral vascular     disease. 5. Edema, left lower extremity  Review of Systems       ROS is negative except as outlined in HPI.   Vital Signs:  Patient profile:   77 year old male Height:      71 inches Weight:      186 pounds BMI:     26.04 Pulse rate:   68 / minute Resp:     16 per minute BP sitting:   106 / 63  (right arm)  Vitals Entered By: Marrion Coy, CNA (March 26, 2010 2:08 PM)  Physical Exam  Additional Exam:  Gen. Well-nourished, in no distress   Neck: No JVD, thyroid not enlarged, no carotid bruits Lungs: No tachypnea, clear without rales, rhonchi or wheezes Cardiovascular: Rhythm regular, PMI not displaced,  heart sounds  normal, no murmurs or gallops, no peripheral edema, pulses normal  in all 4 extremities. Abdomen: BS normal, abdomen soft and non-tender without masses or organomegaly, no hepatosplenomegaly. MS: No deformities, no cyanosis or clubbing   Neuro:  No focal sns   Skin:  no lesions    Impression & Recommendations:  Problem # 1:  ATRIAL FIBRILLATION (ICD-427.31) His rhythm appears regular diet it appears that he is holding sinus rhythm. Multaq was too expensive for him so he did not fill that prescription. We'll ask them to finish up his samples and will leave him off rhythm control medications with the hopes that he'll hold sinus rhythm. If we add  amiodarone afraid his rate will get too slow he'll need a pacemaker. We will continue Coumadin. His updated medication list for this problem includes:    Warfarin Sodium 5 Mg Tabs (Warfarin sodium) ..... Use as directed by anticoagulation clinic    Buffered Aspirin 325 Mg Tabs (Aspirin buf(cacarb-mgcarb-mgo)) .Marland Kitchen... Take one by mouth daily    Multaq 400 Mg Tabs (Dronedarone hcl) .Marland Kitchen... Take one tab by mouth two times a day  Orders: EKG w/ Interpretation (93000) TLB-BMP (Basic Metabolic Panel-BMET) (80048-METABOL)  Problem # 2:  DIASTOLIC HEART FAILURE, ACUTE ON CHRONIC (ICD-428.33) Last time he had some volume overloaded and he increased his Lasix. His creatinine went from 1.4-1.8. We will repeat that today. It remained stable while in on the same dose of Lasix. He appears euvolemic today. His updated medication list for this problem includes:    Nitrolingual 0.4 Mg/spray Soln (Nitroglycerin) ..... Use as needed as directed    Ramipril 10 Mg Caps (Ramipril) .Marland Kitchen... Take 1 capsule by mouth twice a day    Furosemide 40 Mg Tabs (Furosemide) .Marland Kitchen... Take 1 and 1/2 tablets in the morning and one tablet in the afternoon.    Warfarin Sodium 5 Mg Tabs (Warfarin sodium) ..... Use as directed by anticoagulation clinic    Buffered Aspirin 325 Mg Tabs (Aspirin buf(cacarb-mgcarb-mgo)) .Marland Kitchen... Take one by mouth daily    Multaq 400 Mg Tabs (Dronedarone hcl) .Marland Kitchen... Take one tab by mouth two times a day  Orders: EKG w/ Interpretation (93000) TLB-BMP (Basic Metabolic Panel-BMET) (80048-METABOL)  Problem # 3:  CAD, NATIVE VESSEL (ICD-414.01) He had a previous anterior MI and previous stenting of the right coronary with a bare-metal stent and the circumflex artery with a drug-eluting stent. He has had no chest pain this problem appears stable. His updated medication list for this problem includes:    Nitrolingual 0.4 Mg/spray Soln (Nitroglycerin) ..... Use as needed as directed    Ramipril 10 Mg Caps (Ramipril) .Marland Kitchen...  Take 1 capsule by mouth twice a day    Warfarin Sodium 5 Mg Tabs (Warfarin sodium) ..... Use as directed by anticoagulation clinic    Buffered Aspirin 325 Mg Tabs (Aspirin buf(cacarb-mgcarb-mgo)) .Marland Kitchen... Take one by mouth daily  Orders: EKG w/ Interpretation (93000) TLB-BMP (Basic Metabolic Panel-BMET) (80048-METABOL)  Patient Instructions: 1)  Your physician recommends that you have lab work today: bmet (428.33;427.31;414.01) 2)  Your physician wants you to follow-up in: 4 months. You will receive a reminder letter in the mail two months in advance. If you don't receive a letter, please call our office to schedule the follow-up appointment. 3)  Your physician recommends that you continue on your current medications as directed. Please refer to the Current Medication list given to you today.

## 2010-11-17 NOTE — Medication Information (Signed)
Summary: rov/ewj  Anticoagulant Therapy  Managed by: Bethena Midget, RN, BSN Referring MD: Charlies Constable, MD PCP: Shaune Leeks MD Supervising MD: Excell Seltzer MD, Casimiro Needle Indication 1: Atrial Fibrillation Lab Used: [facility] [place] Site: [address] INR POC 1.3 INR RANGE 2-3  Dietary changes: no    Health status changes: no    Bleeding/hemorrhagic complications: no    Recent/future hospitalizations: no    Any changes in medication regimen? no    Recent/future dental: no  Any missed doses?: no       Is patient compliant with meds? yes       Allergies: 1)  ! Pcn 2)  Naprosyn (Naproxen)  Anticoagulation Management History:      The patient is taking warfarin and comes in today for a routine follow up visit.  Positive risk factors for bleeding include an age of 75 years or older and presence of serious comorbidities.  The bleeding index is 'intermediate risk'.  Positive CHADS2 values include History of CHF, History of HTN, and Age > 41 years old.  His last INR was 1.0 RATIO.  Anticoagulation responsible provider: Excell Seltzer MD, Casimiro Needle.  INR POC: 1.3.  Cuvette Lot#: [id number].  Exp: 08/2011.    Anticoagulation Management Assessment/Plan:      The patient's current anticoagulation dose is Warfarin sodium 5 mg tabs: Use as directed by Anticoagulation Clinic.  The target INR is 2.0-3.0.  The next INR is due 07/16/2010.  Anticoagulation instructions were given to patient.  Results were reviewed/authorized by Bethena Midget, RN, BSN.  He was notified by Bethena Midget, RN, BSN.         Prior Anticoagulation Instructions: INR 1.7  Start taking 1 tablet daily except 1.5 tablets on Sundays, Tuesdays, and Thursdays.  Recheck in 3 weeks.    Current Anticoagulation Instructions: INR 1.3 Today take 2 pills, on tomorrow take 2 pills then change dose to 1.5 pills everyday except 1 pill on Mondays, Wednesdays and Fridays per pharm D. Recheck in 10 days.

## 2010-11-17 NOTE — Medication Information (Signed)
Summary: rov/tm  Anticoagulant Therapy  Managed by: Weston Brass, PharmD Referring MD: Charlies Constable, MD PCP: Shaune Leeks MD Supervising MD: Clifton James MD, Cristal Deer Indication 1: Atrial Fibrillation Lab Used: [facility] [place] Site: [address] INR POC 1.9 INR RANGE 2-3  Dietary changes: no    Health status changes: no    Bleeding/hemorrhagic complications: no    Recent/future hospitalizations: no    Any changes in medication regimen? no    Recent/future dental: no  Any missed doses?: no       Is patient compliant with meds? yes       Allergies: 1)  ! Pcn 2)  Naprosyn (Naproxen)  Anticoagulation Management History:      The patient is taking warfarin and comes in today for a routine follow up visit.  Positive risk factors for bleeding include an age of 75 years or older and presence of serious comorbidities.  The bleeding index is 'intermediate risk'.  Positive CHADS2 values include History of CHF, History of HTN, and Age > 68 years old.  His last INR was 1.0 RATIO.  Anticoagulation responsible provider: Clifton James MD, Cristal Deer.  INR POC: 1.9.  Cuvette Lot#: [id number].  Exp: 07/2011.    Anticoagulation Management Assessment/Plan:      The patient's current anticoagulation dose is Warfarin sodium 5 mg tabs: Use as directed by Anticoagulation Clinic.  The target INR is 2.0-3.0.  The next INR is due 06/15/2010.  Anticoagulation instructions were given to patient.  Results were reviewed/authorized by Weston Brass, PharmD.  He was notified by Gweneth Fritter, PharmD Candidate.         Prior Anticoagulation Instructions: INR 2.2 Continue 1 pill everyday except 1.5  pills on Sundays and Thursdays. Recheck in 3 weeks.   Current Anticoagulation Instructions: INR 1.9  Take and extra 1/2 tablet today.  Then continue taking 1 tablet (5mg ) every day except take 1.5 tablets (7.5mg ) on Sun and Thurs.  Recheck in 3 weeks.

## 2010-11-17 NOTE — Progress Notes (Signed)
----   Converted from flag ---- ---- 07/23/2010 7:16 AM, Shaune Leeks MD wrote: BMET 428.33 PSA V76.44 CBC 428.33 CHOL PROF HEPATIC TSH 272.0 VIT D 268.9 SERUM FE 280.9 B12 266.2  ---- 07/22/2010 11:48 AM, Liane Comber CMA (AAMA) wrote: Lab orders please! Good Morning! This pt is scheduled for cpx labs tomorrow, which labs to draw and dx codes to use? Thanks Tasha ------------------------------

## 2010-11-17 NOTE — Progress Notes (Signed)
Summary:  P t have question about medications  Phone Note Call from Patient Call back at [facility] Phone [phone]   Caller: Patient Summary of Call: Pt request call baout his medications Initial call taken by: Judie Grieve,  Mar 06, 2010 4:09 PM  Follow-up for Phone Call        I called and spoke with the pt. He needed clarification on how to take his lasix. Per his lab results, he should be taking lasix 40mg  1& 1/2 tabs in the am and 1 tab in the pm. He his due for a bmet on 5/25 when he comes for his coumadin check. The pt verbalizes understanding. Follow-up by: Sherri Rad, RN, BSN,  Mar 06, 2010 4:39 PM

## 2010-11-17 NOTE — Medication Information (Signed)
Summary: rov/jm  Anticoagulant Therapy  Managed by: Reina Fuse, PharmD Referring MD: Charlies Constable, MD PCP: Shaune Leeks MD Supervising MD: Antoine Poche MD, Fayrene Fearing Indication 1: Atrial Fibrillation Lab Used: [facility] [place] Site: [address] INR POC 1.4 INR RANGE 2-3  Dietary changes: no    Health status changes: no    Bleeding/hemorrhagic complications: no    Recent/future hospitalizations: no    Any changes in medication regimen? no    Recent/future dental: no  Any missed doses?: no       Is patient compliant with meds? yes       Current Medications (verified): 1)  Cardura 8 Mg Tabs (Doxazosin Mesylate) .... Take 1/2 Tab  By Mouth Daily 2)  Iron 325 (65 Fe) Mg Tabs (Ferrous Sulfate) .... Take One By Mouth Twice A Day 3)  Simvastatin 80 Mg Tabs (Simvastatin) .... One Tab By Mouth At Night 4)  Nitrolingual 0.4 Mg/spray Soln (Nitroglycerin) .... Use As Needed As Directed 5)  Fosamax 10 Mg Tabs (Alendronate Sodium) .Marland Kitchen.. 1 Daily By Mouth 6)  Ramipril 10 Mg Caps (Ramipril) .... Take 1 Capsule By Mouth Twice A Day 7)  Furosemide 40 Mg Tabs (Furosemide) .... Take 1 and 1/2 Tablets in The Morning and One Tablet in The Afternoon. 8)  Vitamin D3 1000 Unit Caps (Cholecalciferol) .Marland Kitchen.. 1 Daily By Mouth 9)  Warfarin Sodium 5 Mg Tabs (Warfarin Sodium) .... Use As Directed By Anticoagulation Clinic 10)  Buffered Aspirin 325 Mg Tabs (Aspirin Buf(Cacarb-Mgcarb-Mgo)) .... Take One By Mouth Daily 11)  Multivitamins  Tabs (Multiple Vitamin) .... Take One By Mouth Daily 12)  Multaq 400 Mg Tabs (Dronedarone Hcl) .... Take One Tab By Mouth Two Times A Day 13)  Potassium Chloride Crys Cr 20 Meq Cr-Tabs (Potassium Chloride Crys Cr) .... Take One Tablet By Mouth Daily  Allergies (verified): 1)  ! Pcn 2)  Naprosyn (Naproxen)  Anticoagulation Management History:      The patient is taking warfarin and comes in today for a routine follow up visit.  Positive risk factors  for bleeding include an age of 75 years or older and presence of serious comorbidities.  The bleeding index is 'intermediate risk'.  Positive CHADS2 values include History of CHF, History of HTN, and Age > 70 years old.  His last INR was 1.0 RATIO.  Anticoagulation responsible provider: Antoine Poche MD, Fayrene Fearing.  INR POC: 1.4.  Cuvette Lot#: [id number].  Exp: 08/2011.    Anticoagulation Management Assessment/Plan:      The patient's current anticoagulation dose is Warfarin sodium 5 mg tabs: Use as directed by Anticoagulation Clinic.  The target INR is 2.0-3.0.  The next INR is due 08/04/2010.  Anticoagulation instructions were given to patient.  Results were reviewed/authorized by Reina Fuse, PharmD.  He was notified by Reina Fuse PharmD.         Prior Anticoagulation Instructions: INR 1.3  Take two tablets today, then increase dose to one and one-half tablets every day except for one tablet on Mondays.  Recheck in ten days.     Current Anticoagulation Instructions: INR 1.4  Tomorrow, Tuesday, October 11th, take Coumadin 2 tabs (10 mg). Then, take Coumadin 1.5 tabs (7.5 mg) every day.  Return to clinic in 2 weeks.

## 2010-11-17 NOTE — Progress Notes (Signed)
Summary: Multaq samples  ---- Converted from flag ---- ---- 02/03/2010 3:33 PM, Lenoria Farrier, MD, [facility] wrote: Tony Reed, Mr Rogertson's [facility] went well today tho he did have some bradycardia.  Can you see if you can get him some more samples of Multaq -- he may run out in the next week or so. Thx. BB ------------------------------  Phone Note Outgoing Call   Call placed by: Sherri Rad, RN, BSN,  February 05, 2010 2:13 PM Call placed to: Patient Summary of Call: The pt is aware that I have placed Multaq samples at the front desk for him to pick up. Initial call taken by: Sherri Rad, RN, BSN,  February 05, 2010 2:13 PM    Prescriptions: MULTAQ 400 MG TABS (DRONEDARONE HCL) take one tab by mouth two times a day  #36 x 0   Entered by:   Sherri Rad, RN, BSN   Authorized by:   Lenoria Farrier, MD, [facility]   Signed by:   Sherri Rad, RN, BSN on 02/05/2010   Method used:   Samples Given   RxID:   [id number]

## 2010-11-17 NOTE — Medication Information (Signed)
Summary: rov  js  Anticoagulant Therapy  Managed by: Bethena Midget, RN, BSN Referring MD: Charlies Constable, MD PCP: Shaune Leeks MD Supervising MD: Myrtis Ser MD, Tinnie Gens Indication 1: Atrial Fibrillation Lab Used: [facility] [place] Site: [address] INR POC 1.8 INR RANGE 2-3  Dietary changes: no    Health status changes: no    Bleeding/hemorrhagic complications: no    Recent/future hospitalizations: no    Any changes in medication regimen? no    Recent/future dental: no  Any missed doses?: no       Is patient compliant with meds? yes       Allergies: 1)  ! Pcn 2)  Naprosyn (Naproxen)  Anticoagulation Management History:      The patient is taking warfarin and comes in today for a routine follow up visit.  Positive risk factors for bleeding include an age of 42 years or older and presence of serious comorbidities.  The bleeding index is 'intermediate risk'.  Positive CHADS2 values include History of CHF, History of HTN, and Age > 51 years old.  His last INR was 1.0 RATIO.  Anticoagulation responsible provider: Myrtis Ser MD, Tinnie Gens.  INR POC: 1.8.  Cuvette Lot#: [id number].  Exp: 05/2011.    Anticoagulation Management Assessment/Plan:      The patient's current anticoagulation dose is Warfarin sodium 5 mg tabs: Use as directed by Anticoagulation Clinic.  The target INR is 2.0-3.0.  The next INR is due 04/13/2010.  Anticoagulation instructions were given to patient.  Results were reviewed/authorized by Bethena Midget, RN, BSN.  He was notified by Bethena Midget, RN, BSN.         Prior Anticoagulation Instructions: The patient is to continue with the same dose of coumadin.  This dosage includes:  Take two whole tablets today and tomorrow (Thursday & Friday June 9 & 10) then resume one tablet daily.   Return to coumadin clinic in one week to be rechecked.  Current Anticoagulation Instructions: INR 1.8 Today take extra 1/2 pill then change dose to 1 pill everyday  except 1 1/2 pills on Sundays and Thursdays. Recheck in 10 days.

## 2010-11-17 NOTE — Letter (Signed)
Summary: Cardioversion/TEE Instructions  Architectural technologist, Main Office  [address]   [place], [place] [address]   Phone: [phone]  Fax: [phone]    Cardioversion / TEE Cardioversion Instructions  You are scheduled for a Cardioversion on Tuesday 02/03/10 with Dr. Juanda Chance.   Please arrive at the [facility] at 11:30 a.m on the day of your procedure.  1)   DIET:  A)   Nothing to eat or drink after midnight except your medications with a sip of water.  B)   May have clear liquid breakfast, then nothing to eat or drink after _________ a.m. / p.m.      Clear liquids include:  water, broth, Sprite, Ginger Ale, black coffee, tea (no sugar),      cranberry / grape / apple juice, jello (not red), popsicle from clear juices (not red).  2)   Come to the [place] office on ______N/A______________ for lab work. The lab at [facility] is open from 8:30 a.m. to 1:30 p.m. and 2:30 p.m. to 5:00 p.m. The lab at [facility] is open from 7:30 a.m. to 5:30 p.m. You do not have to be fasting.  3)   MAKE SURE YOU TAKE YOUR COUMADIN.  4)   A)   DO NOT TAKE these medications before your procedure:      ___________________________________________________________________     ___________________________________________________________________     ___________________________________________________________________  B)   YOU MAY TAKE ALL of your remaining medications with a small amount of water.    C)   START NEW medications:       ___________________________________________________________________     ___________________________________________________________________  5)  Must have a responsible person to drive you home.  6)   Bring a current list of your medications and current insurance cards.   * Special Note:  Every effort is made to have your procedure done on time. Occasionally there are emergencies that present themselves  at the hospital that may cause delays. Please be patient if a delay does occur.  * If you have any questions after you get home, please call the office at [phone].

## 2010-11-17 NOTE — Progress Notes (Signed)
Summary: NEED A COPY OF COUMADIN  PAPER  Phone Note Call from Patient Call back at Home Phone [phone]   Caller: Patient Summary of Call: PT NEED ANOTHER COPY OF HIS COUMADIN LEVEL PAPER Initial call taken by: Judie Grieve,  March 27, 2010 1:23 PM  Follow-up for Phone Call        Spoke with pt and  coumadin dose card filled out and left at front desk per his request.  Follow-up by: Bethena Midget, RN, BSN,  March 27, 2010 2:22 PM

## 2010-11-17 NOTE — Medication Information (Signed)
Summary: rov/tm  Anticoagulant Therapy  Managed by: Bethena Midget, RN, BSN Referring MD: Charlies Constable, MD PCP: Shaune Leeks MD Supervising MD: Jens Som MD, Arlys John Indication 1: Atrial Fibrillation Lab Used: [facility] [place] Site: [address] INR POC 2.1 INR RANGE 2-3  Dietary changes: no    Health status changes: no    Bleeding/hemorrhagic complications: no    Recent/future hospitalizations: no    Any changes in medication regimen? no    Recent/future dental: no  Any missed doses?: no       Is patient compliant with meds? yes       Allergies: 1)  ! Pcn 2)  Naprosyn (Naproxen)  Anticoagulation Management History:      The patient is taking warfarin and comes in today for a routine follow up visit.  Positive risk factors for bleeding include an age of 26 years or older and presence of serious comorbidities.  The bleeding index is 'intermediate risk'.  Positive CHADS2 values include History of CHF, History of HTN, and Age > 74 years old.  His last INR was 1.0 RATIO.  Anticoagulation responsible provider: Jens Som MD, Arlys John.  INR POC: 2.1.  Cuvette Lot#: [id number].  Exp: 05/2011.    Anticoagulation Management Assessment/Plan:      The patient's current anticoagulation dose is Warfarin sodium 5 mg tabs: Use as directed by Anticoagulation Clinic.  The target INR is 2.0-3.0.  The next INR is due 04/24/2010.  Anticoagulation instructions were given to patient.  Results were reviewed/authorized by Bethena Midget, RN, BSN.  He was notified by Bethena Midget, RN, BSN.         Prior Anticoagulation Instructions: INR 1.8 Today take extra 1/2 pill then change dose to 1 pill everyday except 1 1/2 pills on Sundays and Thursdays. Recheck in 10 days.   Current Anticoagulation Instructions: INR 2.1 Continue 1 pill everyday except 1 1/2 pills on Sundays and Thursdays. Recheck in 2 weeks.

## 2010-11-17 NOTE — Assessment & Plan Note (Signed)
Summary: 6 Month follow-up after labs/ [facility] from 11/03/09   Vital Signs:  Patient profile:   77 year old male Weight:      213 pounds Temp:     98.0 degrees F oral Pulse rate:   72 / minute Pulse rhythm:   regular BP sitting:   118 / 76  (left arm) Cuff size:   large  Vitals Entered By: Sydell Axon LPN (November 17, 2009 12:03 PM) CC: 6 Month follow-up after labs   History of Present Illness: Pt here fior 6 month followup. Is taking Vit D 1000Iu two times a day, started last time and is to be on B12 but his MVI does not have B12 in it.  Since being seen, he has been in the hosp for Afib and is now on Coumadin and checked regularly. He complains of inablilty to squat or exert much w/o becoming SOB. He does not have chest pain but it is very easy for him to cause SOB, occas this makes him sweaty, has not noticed nausea.  He has no other complaints.  Problems Prior to Update: 1)  Atrial Fibrillation  (ICD-427.31) 2)  Cardiovascular Function Study, Abnormal  (ICD-794.30) 3)  Cad, Native Vessel  (ICD-414.01) 4)  Unspecified Vitamin D Deficiency  (ICD-268.9) 5)  Knee Sprain, Left  (ICD-844.9) 6)  Special Screening Malig Neoplasms Other Sites  (ICD-V76.49) 7)  Edema  (ICD-782.3) 8)  Screening For Malignant Neoplasm, Prostate  (ICD-V76.44) 9)  Anemia, Iron Deficiency  (ICD-280.9) 10)  Anemia Due To Chronic Blood Loss  (ICD-280.0) 11)  Hematuria W/u Neg (PETERSON)  (ICD-599.7) 12)  B12 Deficiency  (ICD-266.2) 13)  Benign Prostatic Hypertrophy, Hx of  (ICD-V13.8) 14)  Testosterone Deficiency  (ICD-257.2) 15)  Cataract, Left Eye  (ICD-366.9) 16)  Polypectomy, Hx of (DR. NELMS)  (ICD-V15.9) 17)  Gerd, H. H. in Ugi  (ICD-530.81) 18)  Hypercholesterolemia (236 LDL 170)  (ICD-272.0) 19)  Diverticulosis, Colon  (ICD-562.10) 20)  Osteopenia Hip/ Porosis Spine  (ICD-733.90) 21)  Myocardial Infarction, Hx of (INF WALL)  (ICD-412) 22)  Hypertension  (ICD-401.9)  Medications Prior to  Update: 1)  Aspirin Ec Low Strength 81 Mg Tbec (Aspirin) .... Take One By Mouth Daily 2)  Cardura 8 Mg Tabs (Doxazosin Mesylate) .... Take 1/2 Tab  By Mouth Daily 3)  Iron 325 (65 Fe) Mg Tabs (Ferrous Sulfate) .... Take One By Mouth Twice A Day 4)  Vytorin 10-20 Mg Tabs (Ezetimibe-Simvastatin) .... Take One By Mouth At Night 5)  Nitrolingual 0.4 Mg/spray Soln (Nitroglycerin) .... Use As Needed As Directed 6)  Fosamax 10 Mg Tabs (Alendronate Sodium) .Marland Kitchen.. 1 Daily By Mouth 7)  Metoprolol Tartrate 50 Mg Tabs (Metoprolol Tartrate) .... 1/2  Tab By Mouth Two Times A Day 8)  Ramipril 10 Mg Caps (Ramipril) .... Take 1 Capsule By Mouth Twice A Day 9)  Furosemide 40 Mg Tabs (Furosemide) .... Take One Tablet By Mouth Daily. 10)  Vitamin D3 1000 Unit Caps (Cholecalciferol) .Marland Kitchen.. 1 Daily By Mouth 11)  Warfarin Sodium 5 Mg Tabs (Warfarin Sodium) .... Use As Directed By Anticoagulation Clinic  Allergies: 1)  ! Pcn 2)  Naprosyn (Naproxen)  Physical Exam  General:  Well-developed,well-nourished,in no acute distress; alert,appropriate and cooperative throughout examination Head:  Normocephalic and atraumatic without obvious abnormalities. No apparent alopecia, slight male pattern .balding. Eyes:  Conjunctiva clear bilaterally.  Ears:  External ear exam shows no significant lesions or deformities.  Otoscopic examination reveals clear canals, tympanic membranes are intact  bilaterally without bulging, retraction, inflammation or discharge. Hearing is grossly normal bilaterally. Nose:  External nasal examination shows no deformity or inflammation. Nasal mucosa are pink and moist without lesions or exudates. Mouth:  Oral mucosa and oropharynx without lesions or exudates.  Teeth in good repair. Neck:  No deformities, masses, or tenderness noted. Lungs:  Normal respiratory effort, chest expands symmetrically. Lungs are clear to auscultation, no crackles or wheezes. Heart:  Well comntrolled rate with irregular  rhythm. S1 and S2 normal without gallop, murmur, click, rub or other extra sounds. Carotids silent. Abdomen:  Bowel sounds positive,abdomen soft and non-tender without masses, organomegaly or hernias noted. Extremities:  No clubbing, cyanosis, edema, or deformity noted with normal full range of motion of all joints.     Impression & Recommendations:  Problem # 1:  CARDIOVASCULAR FUNCTION STUDY, ABNORMAL (ICD-794.30) Assessment Deteriorated Pt is having progression of SOB with squatting and exertion. Will refer back to cardiology for assessment just to be sure. He is anemic but has been for quit4e a while. We will try to tune that slightly. Orders: Cardiology Referral (Cardiology)  Problem # 2:  ATRIAL FIBRILLATION (ICD-427.31) Assessment: Unchanged  Seems baseline with good rate control. Coumadin therapeutic. The following medications were removed from the medication list:    Aspirin Ec Low Strength 81 Mg Tbec (Aspirin) .Marland Kitchen... Take one by mouth daily His updated medication list for this problem includes:    Metoprolol Tartrate 50 Mg Tabs (Metoprolol tartrate) .Marland Kitchen... 1/2  tab by mouth two times a day    Warfarin Sodium 5 Mg Tabs (Warfarin sodium) ..... Use as directed by anticoagulation clinic    Buffered Aspirin 325 Mg Tabs (Aspirin buf(cacarb-mgcarb-mgo)) .Marland Kitchen... Take one by mouth daily  Orders: Cardiology Referral (Cardiology)  Reviewed the following: Coumadin Dose (weekly): 27.50 mg (10/29/2009) Prior Coumadin Dose (weekly): 27.50 mg (10/29/2009) Next Protime: 11/26/2009 (dated on 10/29/2009)  Problem # 3:  UNSPECIFIED VITAMIN D DEFICIENCY (ICD-268.9) Vit D from 16 last time to 26 today on two times a day dosing OTC 1000Iu. Increase to three times a day.  Problem # 4:  B12 DEFICIENCY (ICD-266.2) Assessment: Unchanged B12 lvl on low side of nml, has actually decreased some lately...get MVI withB12 or simple B12 pill and start taking once a day regularly.  Problem # 5:  ANEMIA,  IRON DEFICIENCY (ICD-280.9) Assessment: Unchanged Stable. Cont f/u with Hematology. Parameters baseline at present. Cont curr meds. His updated medication list for this problem includes:    Iron 325 (65 Fe) Mg Tabs (Ferrous sulfate) .Marland Kitchen... Take one by mouth twice a day  Hgb: 11.3 (10/29/2009)   Hct: 34.2 (10/29/2009)   Platelets: 148.0 (10/29/2009) RBC: 3.52 (10/29/2009)   RDW: 13.0 (10/29/2009)   WBC: 3.7 (10/29/2009) MCV: 97.3 (10/29/2009)   MCHC: 32.9 (10/29/2009) Ferritin: 84.5 (02/26/2008) Iron: 62 (10/29/2009)   B12: 294 (10/29/2009)   TSH: 2.11 (04/23/2009)  Problem # 6:  HYPERTENSION (ICD-401.9) Assessment: Improved Cont curr meds. His updated medication list for this problem includes:    Cardura 8 Mg Tabs (Doxazosin mesylate) .Marland Kitchen... Take 1/2 tab  by mouth daily    Metoprolol Tartrate 50 Mg Tabs (Metoprolol tartrate) .Marland Kitchen... 1/2  tab by mouth two times a day    Ramipril 10 Mg Caps (Ramipril) .Marland Kitchen... Take 1 capsule by mouth twice a day    Furosemide 40 Mg Tabs (Furosemide) .Marland Kitchen... Take one tablet by mouth daily.  BP today: 118/76 Prior BP: 159/90 (06/13/2009)  Labs Reviewed: K+: 3.6 (06/27/2009) Creat: : 1.1 (06/27/2009)  Chol: 131 (04/23/2009)   HDL: 48.70 (04/23/2009)   LDL: 69 (04/23/2009)   TG: 66.0 (04/23/2009)  Complete Medication List: 1)  Cardura 8 Mg Tabs (Doxazosin mesylate) .... Take 1/2 tab  by mouth daily 2)  Iron 325 (65 Fe) Mg Tabs (Ferrous sulfate) .... Take one by mouth twice a day 3)  Vytorin 10-20 Mg Tabs (Ezetimibe-simvastatin) .... Take one by mouth at night 4)  Nitrolingual 0.4 Mg/spray Soln (Nitroglycerin) .... Use as needed as directed 5)  Fosamax 10 Mg Tabs (Alendronate sodium) .Marland Kitchen.. 1 daily by mouth 6)  Metoprolol Tartrate 50 Mg Tabs (Metoprolol tartrate) .... 1/2  tab by mouth two times a day 7)  Ramipril 10 Mg Caps (Ramipril) .... Take 1 capsule by mouth twice a day 8)  Furosemide 40 Mg Tabs (Furosemide) .... Take one tablet by mouth daily. 9)  Vitamin  D3 1000 Unit Caps (Cholecalciferol) .Marland Kitchen.. 1 daily by mouth 10)  Warfarin Sodium 5 Mg Tabs (Warfarin sodium) .... Use as directed by anticoagulation clinic 11)  Buffered Aspirin 325 Mg Tabs (Aspirin buf(cacarb-mgcarb-mgo)) .... Take one by mouth daily 12)  Multivitamins Tabs (Multiple vitamin) .... Take one by mouth daily  Patient Instructions: 1)  Get B12 or a MVI with B12 and take daily. 2)  Increase Vit D to three  times a day.  3)  Refer back to Cardiology.  Current Allergies (reviewed today): ! PCN NAPROSYN (NAPROXEN)

## 2010-11-17 NOTE — Medication Information (Signed)
Summary: rov/tm  Anticoagulant Therapy  Managed by: Lew Dawes, PharmD Candidate Referring MD: Charlies Constable, MD PCP: Shaune Leeks MD Supervising MD: Graciela Husbands MD, Viviann Spare Indication 1: Atrial Fibrillation Lab Used: [facility] [place] Site: [address] INR POC 2.1 INR RANGE 2-3  Dietary changes: no    Health status changes: no    Bleeding/hemorrhagic complications: no    Recent/future hospitalizations: no    Any changes in medication regimen? no    Recent/future dental: no  Any missed doses?: no       Is patient compliant with meds? yes       Allergies: 1)  ! Pcn 2)  Naprosyn (Naproxen)  Anticoagulation Management History:      The patient is taking warfarin and comes in today for a routine follow up visit.  Positive risk factors for bleeding include an age of 75 years or older.  The bleeding index is 'intermediate risk'.  Positive CHADS2 values include History of HTN and Age > 35 years old.  His last INR was 1.0 RATIO.  Anticoagulation responsible provider: Graciela Husbands MD, Viviann Spare.  INR POC: 2.1.  Cuvette Lot#: [id number].  Exp: 01/2011.    Anticoagulation Management Assessment/Plan:      The patient's current anticoagulation dose is Warfarin sodium 5 mg tabs: Use as directed by Anticoagulation Clinic.  The target INR is 2.0-3.0.  The next INR is due 11/26/2009.  Anticoagulation instructions were given to patient.  Results were reviewed/authorized by Lew Dawes, PharmD Candidate.  He was notified by Lew Dawes, PharmD Candidate.         Prior Anticoagulation Instructions: INR 2.4 Continue 1 pill everyday except 1/2 pill on Tuesdays, Thursdays and  Saturdays. Recheck in 4 weeks.   Current Anticoagulation Instructions: INR 2.1  Continue same dose of 1 tablet daily except 0.5 tablet on Tuesdays, Thursdays, and Saturdays. Recheck in 4 weeks.

## 2010-11-17 NOTE — Medication Information (Signed)
Summary: [facility] Reed  Anticoagulant Therapy  Managed by: Cloyde Reams, RN, BSN Referring MD: Charlies Constable, MD PCP: Shaune Leeks MD Supervising MD: Riley Kill MD, Maisie Fus Indication 1: Atrial Fibrillation Lab Used: [facility] [place] Site: [address] INR POC 1.7 INR RANGE 2-3  Dietary changes: no    Health status changes: no    Bleeding/hemorrhagic complications: no    Recent/future hospitalizations: no    Any changes in medication regimen? no    Recent/future dental: no  Any missed doses?: no       Is patient compliant with meds? yes       Allergies: 1)  ! Pcn 2)  Naprosyn (Naproxen)  Anticoagulation Management History:      The patient is taking warfarin and comes in today for a routine follow up visit.  Positive risk factors for bleeding include an age of 58 years or older and presence of serious comorbidities.  The bleeding index is 'intermediate risk'.  Positive CHADS2 values include History of CHF, History of HTN, and Age > 76 years old.  His last INR was 1.0 RATIO.  Anticoagulation responsible provider: Riley Kill MD, Maisie Fus.  INR POC: 1.7.  Cuvette Lot#: [id number].  Exp: 07/2011.    Anticoagulation Management Assessment/Plan:      The patient's current anticoagulation dose is Warfarin sodium 5 mg tabs: Use as directed by Anticoagulation Clinic.  The target INR is 2.0-3.0.  The next INR is due 07/06/2010.  Anticoagulation instructions were given to patient.  Results were reviewed/authorized by Cloyde Reams, RN, BSN.  He was notified by Cloyde Reams RN.         Prior Anticoagulation Instructions: INR 1.9  Take and extra 1/2 tablet today.  Then continue taking 1 tablet (5mg ) every day except take 1.5 tablets (7.5mg ) on Sun and Thurs.  Recheck in 3 weeks.   Current Anticoagulation Instructions: INR 1.7  Start taking 1 tablet daily except 1.5 tablets on Sundays, Tuesdays, and Thursdays.  Recheck in 3 weeks.

## 2010-11-17 NOTE — Miscellaneous (Signed)
Summary: nitroglycerin  Clinical Lists Changes  Medications: Changed medication from NITROLINGUAL 0.4 MG/SPRAY SOLN (NITROGLYCERIN) Use as needed as directed to NITROSTAT 0.4 MG SUBL (NITROGLYCERIN) use SL as directed as needed - Signed Rx of NITROSTAT 0.4 MG SUBL (NITROGLYCERIN) use SL as directed as needed;  #25 x 3;  Signed;  Entered by: Lowella Petties CMA;  Authorized by: Shaune Leeks MD;  Method used: Electronically to [facility] [id number] [place], [place], [place]  [address], Ph: [phone], Fax: [phone]    Prescriptions: NITROSTAT 0.4 MG SUBL (NITROGLYCERIN) use SL as directed as needed  #25 x 3   Entered by:   Lowella Petties CMA   Authorized by:   Shaune Leeks MD   Signed by:   Lowella Petties CMA on 07/31/2010   Method used:   Electronically to        [facility] [id number] (retail)       [address] [place], [place]  [address]       Ph: [phone]       Fax: [phone]   RxID:   [id number]   Prior Medications: CARDURA 8 MG TABS (DOXAZOSIN MESYLATE) Take 1/2 tab  by mouth daily IRON 325 (65 FE) MG TABS (FERROUS SULFATE) Take one by mouth TWICE A DAY SIMVASTATIN 80 MG TABS (SIMVASTATIN) one tab by mouth at night FOSAMAX 10 MG TABS (ALENDRONATE SODIUM) 1 DAILY BY MOUTH RAMIPRIL 10 MG CAPS (RAMIPRIL) Take 1 capsule by mouth TWICE A DAY FUROSEMIDE 40 MG TABS (FUROSEMIDE) take 1 and 1/2 tablets in the morning and one tablet in the afternoon. VITAMIN D3 1000 UNIT CAPS (CHOLECALCIFEROL) 1 DAILY BY MOUTH WARFARIN SODIUM 5 MG TABS (WARFARIN SODIUM) Use as directed by Anticoagulation Clinic BUFFERED ASPIRIN 325 MG TABS (ASPIRIN BUF(CACARB-MGCARB-MGO)) take one by mouth daily MULTIVITAMINS  TABS (MULTIPLE VITAMIN) Take one by mouth daily MULTAQ 400 MG TABS (DRONEDARONE HCL) take one tab by mouth two times a day POTASSIUM CHLORIDE CRYS CR 20 MEQ CR-TABS (POTASSIUM CHLORIDE CRYS CR) Take one  tablet by mouth daily Current Allergies: ! PCN NAPROSYN (NAPROXEN)

## 2010-11-17 NOTE — Medication Information (Signed)
Summary: rov/eac  Anticoagulant Therapy  Managed by: Eda Keys, PharmD Referring MD: Charlies Constable, MD PCP: Shaune Leeks MD Supervising MD: Johney Frame MD, Fayrene Fearing Indication 1: Atrial Fibrillation Lab Used: [facility] [place] Site: [address] INR POC 1.8 INR RANGE 2-3  Dietary changes: no    Health status changes: no    Bleeding/hemorrhagic complications: no    Recent/future hospitalizations: no    Any changes in medication regimen? no    Recent/future dental: no  Any missed doses?: no       Is patient compliant with meds? yes       Allergies: 1)  ! Pcn 2)  Naprosyn (Naproxen)  Anticoagulation Management History:      The patient is taking warfarin and comes in today for a routine follow up visit.  Positive risk factors for bleeding include an age of 7 years or older and presence of serious comorbidities.  The bleeding index is 'intermediate risk'.  Positive CHADS2 values include History of CHF, History of HTN, and Age > 29 years old.  His last INR was 1.0 RATIO.  Anticoagulation responsible provider: Alexsa Flaum MD, Fayrene Fearing.  INR POC: 1.8.  Cuvette Lot#: [id number].  Exp: 05/2011.    Anticoagulation Management Assessment/Plan:      The patient's current anticoagulation dose is Warfarin sodium 5 mg tabs: Use as directed by Anticoagulation Clinic.  The target INR is 2.0-3.0.  The next INR is due 03/25/2010.  Anticoagulation instructions were given to patient.  Results were reviewed/authorized by Eda Keys, PharmD.  He was notified by Eda Keys.         Prior Anticoagulation Instructions: INR 1.5  Take 1.5 tablets today.  Start NEW dosing schedule of 1/2 tablet on Monday only, and 1 tablet all other days.  Return to clinic in 2 weeks.   Current Anticoagulation Instructions: INR 1.8  Take and extra 1/2 tablet (1.5 total) today.  Then start NEW dosing schedule of 1/2 tablet everyday.  Return to clinic in 2 weeks.

## 2010-11-17 NOTE — Medication Information (Signed)
Summary: rov/sp  Anticoagulant Therapy  Managed by: Weston Brass, PharmD Referring MD: Charlies Constable, MD PCP: Shaune Leeks MD Supervising MD: Myrtis Ser MD, Tinnie Gens Indication 1: Atrial Fibrillation Lab Used: [facility] [place] Site: [address] INR POC 3.9 INR RANGE 2-3  Dietary changes: no    Health status changes: no    Bleeding/hemorrhagic complications: no    Recent/future hospitalizations: no    Any changes in medication regimen? no    Recent/future dental: no  Any missed doses?: no       Is patient compliant with meds? yes       Allergies: 1)  ! Pcn 2)  Naprosyn (Naproxen)  Anticoagulation Management History:      The patient is taking warfarin and comes in today for a routine follow up visit.  Positive risk factors for bleeding include an age of 60 years or older and presence of serious comorbidities.  The bleeding index is 'intermediate risk'.  Positive CHADS2 values include History of CHF, History of HTN, and Age > 33 years old.  His last INR was 1.0 RATIO.  Anticoagulation responsible provider: Myrtis Ser MD, Tinnie Gens.  INR POC: 3.9.  Cuvette Lot#: [id number].  Exp: 09/2011.    Anticoagulation Management Assessment/Plan:      The patient's current anticoagulation dose is Warfarin sodium 5 mg tabs: Use as directed by Anticoagulation Clinic.  The target INR is 2.0-3.0.  The next INR is due 09/25/2010.  Anticoagulation instructions were given to patient.  Results were reviewed/authorized by Weston Brass, PharmD.  He was notified by Weston Brass PharmD.         Prior Anticoagulation Instructions: INR 1.6  Take an extra tablet today then increase dose to 2 tablets every day excpet 1 1/2 tablets on Thursday.  Recheck INR in 1 week.    Current Anticoagulation Instructions: INR 3.9  Skip tomorrow's dose of Coumadin then decrease dose to 2 tablets every day except 1 1/2 tablets on Monday and Thursday.  Recheck INR in 2 weeks.

## 2010-11-17 NOTE — Assessment & Plan Note (Signed)
Summary: per check out   Visit Type:  Follow-up Primary Provider:  Shaune Leeks MD  CC:  no complaints-PT states he has no med list but there has not been any changes.  History of Present Illness: The patient is 77 years old and return for management of atrial fibrillation.  In 2003 he had an inferior MI treated with a bare metal stent to the right coronary artery and he later had a Cypher stent placed to the circumflex artery in September of 2010 he developed atrial fibrillation and underwent catheterization and was found to have nonobstructive coronary disease. He was supposed to come back for followup to address his atrial fibrillation but this did not happen. I saw him in February at which time he was in persistent atrial fibrillation and we started him on multi-and Coumadin with plans to get him back into sinus rhythm. His main symptoms have been decreased exercise tolerance and fatigue.  We evaluated with an echocardiogram which showed an ejection fraction of 55-60%. His other problems include hypertension, hyperlipidemia, and peripheral vascular disease.  Current Medications (verified): 1)  Cardura 8 Mg Tabs (Doxazosin Mesylate) .... Take 1/2 Tab  By Mouth Daily 2)  Iron 325 (65 Fe) Mg Tabs (Ferrous Sulfate) .... Take One By Mouth Twice A Day 3)  Vytorin 10-20 Mg Tabs (Ezetimibe-Simvastatin) .... Take One By Mouth At Night 4)  Nitrolingual 0.4 Mg/spray Soln (Nitroglycerin) .... Use As Needed As Directed 5)  Fosamax 10 Mg Tabs (Alendronate Sodium) .Marland Kitchen.. 1 Daily By Mouth 6)  Ramipril 10 Mg Caps (Ramipril) .... Take 1 Capsule By Mouth Twice A Day 7)  Furosemide 40 Mg Tabs (Furosemide) .... Take Two Tabs Once Daily 8)  Vitamin D3 1000 Unit Caps (Cholecalciferol) .Marland Kitchen.. 1 Daily By Mouth 9)  Warfarin Sodium 5 Mg Tabs (Warfarin Sodium) .... Use As Directed By Anticoagulation Clinic 10)  Buffered Aspirin 325 Mg Tabs (Aspirin Buf(Cacarb-Mgcarb-Mgo)) .... Take One By Mouth Daily 11)   Multivitamins  Tabs (Multiple Vitamin) .... Take One By Mouth Daily 12)  Multaq 400 Mg Tabs (Dronedarone Hcl) .... Take One Tab By Mouth Two Times A Day  Allergies (verified): 1)  ! Pcn 2)  Naprosyn (Naproxen)  Past History:  Past Medical History: Reviewed history from 06/12/2009 and no changes required. Hypertension Myocardial infarction, hx of Osteopenia Osteoporosis Diverticulosis, colon GERD 1. Coronary artery disease status post diaphragmatic wall infarction     in 2003, treated with stenting to the right coronary with a bare-     metal stent with subsequent elective stenting of the circumflex     artery with a Cypher stent with nonobstructive disease at last     catheterization in 2008. 2. Hypertension, now under good control. 3. Hyperlipidemia. 4. Leg pain and decreased pulses consistent with peripheral vascular     disease. 5. Edema, left lower extremity  Review of Systems       ROS is negative except as outlined in HPI.   Vital Signs:  Patient profile:   77 year old male Height:      71 inches Weight:      202 pounds Pulse rate:   69 / minute BP sitting:   116 / 64  (left arm) Cuff size:   large  Vitals Entered By: Burnett Kanaris, CNA (February 02, 2010 11:20 AM)  Physical Exam  Additional Exam:  Gen. Well-nourished, in no distress   Neck: JVD just above the clavicle, thyroid not enlarged, no carotid bruits Lungs: No tachypnea,  clear without rales, rhonchi or wheezes Cardiovascular: Rhythm irregular, PMI not displaced,  heart sounds  normal, no murmurs or gallops, 1+ peripheral edema, pulses normal in all 4 extremities. Abdomen: BS normal, abdomen soft and non-tender without masses or organomegaly, no hepatosplenomegaly. MS: No deformities, no cyanosis or clubbing   Neuro:  No focal sns   Skin:  no lesions    Impression & Recommendations:  Problem # 1:  ATRIAL FIBRILLATION (ICD-427.31)  He has had persistent atrial fibrillation despite multaq and has  had symptoms of fatigue and decreased exercise tolerance. His INRs have been therapeutic for 4 weeks. We will plan to bring him in for DC cardioversion tomorrow or next Tuesday.  His INR today was 3.6. His updated medication list for this problem includes:    Warfarin Sodium 5 Mg Tabs (Warfarin sodium) ..... Use as directed by anticoagulation clinic    Buffered Aspirin 325 Mg Tabs (Aspirin buf(cacarb-mgcarb-mgo)) .Marland Kitchen... Take one by mouth daily    Multaq 400 Mg Tabs (Dronedarone hcl) .Marland Kitchen... Take one tab by mouth two times a day  Orders: EKG w/ Interpretation (93000) Cardioversion (Cardioversion)  Problem # 2:  DIASTOLIC HEART FAILURE, ACUTE ON CHRONIC (ICD-428.33) He has a history of diastolic heart failure. His Lyme status today is borderline. We'll continue his current medications. His updated medication list for this problem includes:    Nitrolingual 0.4 Mg/spray Soln (Nitroglycerin) ..... Use as needed as directed    Ramipril 10 Mg Caps (Ramipril) .Marland Kitchen... Take 1 capsule by mouth twice a day    Furosemide 40 Mg Tabs (Furosemide) .Marland Kitchen... Take two tabs once daily    Warfarin Sodium 5 Mg Tabs (Warfarin sodium) ..... Use as directed by anticoagulation clinic    Buffered Aspirin 325 Mg Tabs (Aspirin buf(cacarb-mgcarb-mgo)) .Marland Kitchen... Take one by mouth daily    Multaq 400 Mg Tabs (Dronedarone hcl) .Marland Kitchen... Take one tab by mouth two times a day  Problem # 3:  CAD, NATIVE VESSEL (ICD-414.01) He has had a previous anterior MI and previous PCI procedures as described in the history of present illness. He has had no recent chest pain this problem appears stable. His updated medication list for this problem includes:    Nitrolingual 0.4 Mg/spray Soln (Nitroglycerin) ..... Use as needed as directed    Ramipril 10 Mg Caps (Ramipril) .Marland Kitchen... Take 1 capsule by mouth twice a day    Warfarin Sodium 5 Mg Tabs (Warfarin sodium) ..... Use as directed by anticoagulation clinic    Buffered Aspirin 325 Mg Tabs (Aspirin  buf(cacarb-mgcarb-mgo)) .Marland Kitchen... Take one by mouth daily  Patient Instructions: 1)  Your physician has recommended that you have a cardioversion (DCCV).  Electrical cardioversion uses a jolt of electricity to your heart either through paddles or wired patches attached to your chest. This is a controlled, usually prescheduled, procedure. Defibrillation is done under light anesthesia in the hospital, and you usually go home the day of the procedure. This is done to get your heart back into a normal rhythm. You are not awake for the procedure. Please see the instruction sheet given to you today.

## 2010-11-17 NOTE — Letter (Signed)
Summary: Nadara Eaton letter  [place] [facility]  [address] [place], [place] [address]   Phone: [phone]  Fax: [phone]       05/26/2010 MRN: [id number]  IZIK BINGMAN [address] [place], [place]  [address]  Dear Mr. XZAYVION, [facility] [place], and [facility] announce the retirement of Arta Silence, M.D., from full-time practice at the [facility] office effective April 16, 2010 and his plans of returning part-time.  It is important to Dr. Hetty Ely and to our practice that you understand that [facility] has seven physicians in our office for your health care needs.  We will continue to offer the same exceptional care that you have today.    Dr. Hetty Ely has spoken to many of you about his plans for retirement and returning part-time in the fall.   We will continue to work with you through the transition to schedule appointments for you in the office and meet the high standards that [facility] is committed to.   Again, it is with great pleasure that we share the news that Dr. Hetty Ely will return to [facility] at [facility] in October of 2011 with a reduced schedule.    If you have any questions, or would like to request an appointment with one of our physicians, please call us at [phone] and press the option for Scheduling an appointment.  We take pleasure in providing you with excellent patient care and look forward to seeing you at your next office visit.  Our [facility] Physicians are:  Tillman Abide, M.D. Laurita Quint, M.D. Roxy Manns, M.D. Kerby Nora, M.D. Hannah Beat, M.D. Ruthe Mannan, M.D. We proudly welcomed Raechel Ache, M.D. and Eustaquio Boyden, M.D. to the practice in July/August 2011.  Sincerely,  [facility]

## 2010-11-17 NOTE — Assessment & Plan Note (Signed)
Summary: rov   Visit Type:  Initial Consult Primary Provider:  Shaune Leeks MD  CC:  fatigue no energy  last couple of months increase sob.  History of Present Illness: The patient is 77 years old and was referred by Dr. Hetty Ely for evaluation and management of atrial fibrillation and fatigue.  He has CAD. In 2003 he had an inferior MI treated with a bare-metal stent to the RCA and later had a Cypher stent to a posterior lateral branch of the circumflex artery. I saw him last fall for symptoms of fatigue and new atrial fibrillation with a controlled ventricular rate and no symptoms of palpitations. We did a Myoview scan which suggested inferior ischemia and he had a catheterization which showed nonobstructive CAD. We treated him with Coumadin with plans to try and restore sinus rhythm. There was some mix up and he did not return for a followup visit in 3 weeks as planned.  He has continued to have symptoms of fatigue and he has  been worse recently and is referred for further evaluation.  He has had no chest pain.  Current Medications (verified): 1)  Cardura 8 Mg Tabs (Doxazosin Mesylate) .... Take 1/2 Tab  By Mouth Daily 2)  Iron 325 (65 Fe) Mg Tabs (Ferrous Sulfate) .... Take One By Mouth Twice A Day 3)  Vytorin 10-20 Mg Tabs (Ezetimibe-Simvastatin) .... Take One By Mouth At Night 4)  Nitrolingual 0.4 Mg/spray Soln (Nitroglycerin) .... Use As Needed As Directed 5)  Fosamax 10 Mg Tabs (Alendronate Sodium) .Marland Kitchen.. 1 Daily By Mouth 6)  Metoprolol Tartrate 50 Mg Tabs (Metoprolol Tartrate) .... 1/2  Tab By Mouth Two Times A Day 7)  Ramipril 10 Mg Caps (Ramipril) .... Take 1 Capsule By Mouth Twice A Day 8)  Furosemide 40 Mg Tabs (Furosemide) .... Take One Tablet By Mouth Daily. 9)  Vitamin D3 1000 Unit Caps (Cholecalciferol) .Marland Kitchen.. 1 Daily By Mouth 10)  Warfarin Sodium 5 Mg Tabs (Warfarin Sodium) .... Use As Directed By Anticoagulation Clinic 11)  Buffered Aspirin 325 Mg Tabs (Aspirin  Buf(Cacarb-Mgcarb-Mgo)) .... Take One By Mouth Daily 12)  Multivitamins  Tabs (Multiple Vitamin) .... Take One By Mouth Daily  Allergies (verified): 1)  ! Pcn 2)  Naprosyn (Naproxen)  Past History:  Past Medical History: Reviewed history from 06/12/2009 and no changes required. Hypertension Myocardial infarction, hx of Osteopenia Osteoporosis Diverticulosis, colon GERD 1. Coronary artery disease status post diaphragmatic wall infarction     in 2003, treated with stenting to the right coronary with a bare-     metal stent with subsequent elective stenting of the circumflex     artery with a Cypher stent with nonobstructive disease at last     catheterization in 2008. 2. Hypertension, now under good control. 3. Hyperlipidemia. 4. Leg pain and decreased pulses consistent with peripheral vascular     disease. 5. Edema, left lower extremity  Family History: Reviewed history from 04/30/2009 and no changes required. Father: Died unknown age Mother: Died at age 42 leukemia and HTN Siblings: One brother died at age 62 from  smoking and lung cancer. Half brother died at age 49 heart problems, smoker.  Half brother A  47 Smoker  Deaf Half Sister A 39  Social History: Reviewed history from 02/22/2007 and no changes required. Marital Status: Married Children: 6 Occupation: Retired  Review of Systems       ROS is negative except as outlined in HPI.   Vital Signs:  Patient profile:  77 year old male Height:      71 inches Weight:      208 pounds BMI:     29.11 Pulse rate:   74 / minute BP sitting:   139 / 85  (left arm) Cuff size:   large  Vitals Entered By: Burnett Kanaris, CNA (December 10, 2009 9:10 AM)  Physical Exam  Additional Exam:  Gen. Well-nourished, in no distress Head: Normocephalic    Eyes: PERRLA/EOM intact; conjunctiva and lids normal Neck: JVD 2 cm above the clavicle, thyroid not enlarged, no carotid bruits Lungs: No tachypnea, clear w/o rales, rhonchi  or wheezes CV: Rhythm irregular, PMI not displaced,  sounds  nl, no murmurs or gallops, one plus bilateral lower extremity edema, pulses nl UE and LE Abd: BS nl, abd soft and non-tender w/o masses or hepatosplenomegaly MS: No deformities Neuro: no focal sns Skin: no lesions Psych: nl affect    Impression & Recommendations:  Problem # 1:  ATRIAL FIBRILLATION (ICD-427.31) He has had persistent atrial fibrillation and has symptoms of fatigue which I think may be related to his atrial fibrillation although this is difficult to be certain about. Even noticed in 6 months I think at this point is still reasonable to try and restore sinus rhythm. We will start treatment with dronedarone and now seeing him back in 3 weeks. If he remains in atrial fibrillation we'll bring him in for DC cardioversion. We will get an INR today and be certain that his INRs remained therapeutic over this time period. His updated medication list for this problem includes:    Metoprolol Tartrate 50 Mg Tabs (Metoprolol tartrate) .Marland Kitchen... 1/2  tab by mouth two times a day    Warfarin Sodium 5 Mg Tabs (Warfarin sodium) ..... Use as directed by anticoagulation clinic    Buffered Aspirin 325 Mg Tabs (Aspirin buf(cacarb-mgcarb-mgo)) .Marland Kitchen... Take one by mouth daily    Multaq 400 Mg Tabs (Dronedarone hcl) .Marland Kitchen... Take one tab by mouth two times a day  Problem # 2:  CAD, NATIVE VESSEL (ICD-414.01) Hhad a previous inferior MI treated with a bare-metal stent to the RCA in 2003 and had a drug-eluting stent the posterior low back to the circumflex artery later. He had nonobstructive disease at recent catheterization has good LV function. This problem appears stable. His updated medication list for this problem includes:    Nitrolingual 0.4 Mg/spray Soln (Nitroglycerin) ..... Use as needed as directed    Metoprolol Tartrate 50 Mg Tabs (Metoprolol tartrate) .Marland Kitchen... 1/2  tab by mouth two times a day    Ramipril 10 Mg Caps (Ramipril) .Marland Kitchen... Take 1  capsule by mouth twice a day    Warfarin Sodium 5 Mg Tabs (Warfarin sodium) ..... Use as directed by anticoagulation clinic    Buffered Aspirin 325 Mg Tabs (Aspirin buf(cacarb-mgcarb-mgo)) .Marland Kitchen... Take one by mouth daily  Orders: EKG w/ Interpretation (93000) TLB-BMP (Basic Metabolic Panel-BMET) (80048-METABOL) TLB-CBC Platelet - w/Differential (85025-CBCD) TLB-TSH (Thyroid Stimulating Hormone) (84443-TSH)  His updated medication list for this problem includes:    Nitrolingual 0.4 Mg/spray Soln (Nitroglycerin) ..... Use as needed as directed    Metoprolol Tartrate 50 Mg Tabs (Metoprolol tartrate) .Marland Kitchen... 1/2  tab by mouth two times a day    Ramipril 10 Mg Caps (Ramipril) .Marland Kitchen... Take 1 capsule by mouth twice a day    Warfarin Sodium 5 Mg Tabs (Warfarin sodium) ..... Use as directed by anticoagulation clinic    Buffered Aspirin 325 Mg Tabs (Aspirin buf(cacarb-mgcarb-mgo)) .Marland Kitchen... Take one  by mouth daily  Problem # 3:  HYPERTENSION (ICD-401.9) This appears reasonably controlled on current medications. His updated medication list for this problem includes:    Cardura 8 Mg Tabs (Doxazosin mesylate) .Marland Kitchen... Take 1/2 tab  by mouth daily    Metoprolol Tartrate 50 Mg Tabs (Metoprolol tartrate) .Marland Kitchen... 1/2  tab by mouth two times a day    Ramipril 10 Mg Caps (Ramipril) .Marland Kitchen... Take 1 capsule by mouth twice a day    Furosemide 40 Mg Tabs (Furosemide) .Marland Kitchen... Take one & a half tablets once daily    Buffered Aspirin 325 Mg Tabs (Aspirin buf(cacarb-mgcarb-mgo)) .Marland Kitchen... Take one by mouth daily  His updated medication list for this problem includes:    Cardura 8 Mg Tabs (Doxazosin mesylate) .Marland Kitchen... Take 1/2 tab  by mouth daily    Metoprolol Tartrate 50 Mg Tabs (Metoprolol tartrate) .Marland Kitchen... 1/2  tab by mouth two times a day    Ramipril 10 Mg Caps (Ramipril) .Marland Kitchen... Take 1 capsule by mouth twice a day    Furosemide 40 Mg Tabs (Furosemide) .Marland Kitchen... Take one & a half tablets once daily    Buffered Aspirin 325 Mg Tabs (Aspirin  buf(cacarb-mgcarb-mgo)) .Marland Kitchen... Take one by mouth daily  Problem # 4:  DIASTOLIC HEART FAILURE, ACUTE ON CHRONIC (ICD-428.33)  He has JVD and peripheral edema which I think represents some diastolic CHF possibly partially related to his atrial fibrillation. His ejection fraction was 60% at catheterization. We will increase his Lasix from 40-60 mg daily. With a BMP today and when he returns for an INR and one half weeks. His updated medication list for this problem includes:    Nitrolingual 0.4 Mg/spray Soln (Nitroglycerin) ..... Use as needed as directed    Metoprolol Tartrate 50 Mg Tabs (Metoprolol tartrate) .Marland Kitchen... 1/2  tab by mouth two times a day    Ramipril 10 Mg Caps (Ramipril) .Marland Kitchen... Take 1 capsule by mouth twice a day    Furosemide 40 Mg Tabs (Furosemide) .Marland Kitchen... Take one & a half tablets once daily    Warfarin Sodium 5 Mg Tabs (Warfarin sodium) ..... Use as directed by anticoagulation clinic    Buffered Aspirin 325 Mg Tabs (Aspirin buf(cacarb-mgcarb-mgo)) .Marland Kitchen... Take one by mouth daily    Multaq 400 Mg Tabs (Dronedarone hcl) .Marland Kitchen... Take one tab by mouth two times a day  His updated medication list for this problem includes:    Nitrolingual 0.4 Mg/spray Soln (Nitroglycerin) ..... Use as needed as directed    Metoprolol Tartrate 50 Mg Tabs (Metoprolol tartrate) .Marland Kitchen... 1/2  tab by mouth two times a day    Ramipril 10 Mg Caps (Ramipril) .Marland Kitchen... Take 1 capsule by mouth twice a day    Furosemide 40 Mg Tabs (Furosemide) .Marland Kitchen... Take one & a half tablets once daily    Warfarin Sodium 5 Mg Tabs (Warfarin sodium) ..... Use as directed by anticoagulation clinic    Buffered Aspirin 325 Mg Tabs (Aspirin buf(cacarb-mgcarb-mgo)) .Marland Kitchen... Take one by mouth daily    Multaq 400 Mg Tabs (Dronedarone hcl) .Marland Kitchen... Take one tab by mouth two times a day  Patient Instructions: 1)  Your physician recommends that you schedule a follow-up appointment in: 3 weeks 2)  Your physician recommends that you have lab work today:  bmet/cbc/tsh (427.31;414.01) 3)  Your physician has recommended you make the following change in your medication: 1) START Multaq 400mg  two times a day , 2) Increase lasix to 60 mg once daily  Prescriptions: MULTAQ 400 MG TABS (DRONEDARONE HCL)  take one tab by mouth two times a day  #42 x 0   Entered by:   Sherri Rad, RN, BSN   Authorized by:   Lenoria Farrier, MD, [facility]   Signed by:   Lenoria Farrier, MD, [facility] on 11-May-202011   Method used:   Samples Given   RxID:   [id number]

## 2010-11-17 NOTE — Progress Notes (Signed)
Summary: LAB RESULTS  Phone Note Call from Patient Call back at [facility] Phone [phone]   Caller: Patient Summary of Call: LABS RESULTS Initial call taken by: Judie Grieve,  Mar 13, 2010 10:08 AM  Follow-up for Phone Call        I spoke with Mr. Katen and gave him his lab results.  He is also aware Dr. Juanda Chance wants to repeat his lab work when he comes in on June 9.  Mr. Pizano would like to change his coumadin appt. from the 8th of June to the 9th.  I will make coumadin clinic aware. Lisabeth Devoid RN

## 2010-11-17 NOTE — Medication Information (Signed)
Summary: rov/eac  Anticoagulant Therapy  Managed by: Minerva Areola, RN Referring MD: Charlies Constable, MD PCP: Shaune Leeks MD Supervising MD: Tenny Craw MD, Gunnar Fusi Indication 1: Atrial Fibrillation Lab Used: [facility] [place] Site: [address] INR POC 1.2 INR RANGE 2-3  Dietary changes: no    Health status changes: no    Bleeding/hemorrhagic complications: no    Recent/future hospitalizations: no    Any changes in medication regimen? no    Recent/future dental: no  Any missed doses?: no        Comments: Pt doesn't think he has missed any doses of coumadin and he tries to eat greens every day.  Allergies: 1)  ! Pcn 2)  Naprosyn (Naproxen)  Anticoagulation Management History:      The patient is taking warfarin and comes in today for a routine follow up visit.  Positive risk factors for bleeding include an age of 18 years or older and presence of serious comorbidities.  The bleeding index is 'intermediate risk'.  Positive CHADS2 values include History of CHF, History of HTN, and Age > 30 years old.  His last INR was 1.0 RATIO.  Anticoagulation responsible provider: Tenny Craw MD, Gunnar Fusi.  INR POC: 1.2.  Cuvette Lot#: [id number].  Exp: 05/2011.    Anticoagulation Management Assessment/Plan:      The patient's current anticoagulation dose is Warfarin sodium 5 mg tabs: Use as directed by Anticoagulation Clinic.  The target INR is 2.0-3.0.  The next INR is due 04/02/2010.  Anticoagulation instructions were given to patient.  Results were reviewed/authorized by Minerva Areola, RN.  He was notified by Minerva Areola, RN, BSN.         Prior Anticoagulation Instructions: INR 1.8  Take and extra 1/2 tablet (1.5 total) today.  Then start NEW dosing schedule of 1/2 tablet everyday.  Return to clinic in 2 weeks.    Current Anticoagulation Instructions: The patient is to continue with the same dose of coumadin.  This dosage includes:  Take two whole tablets today  and tomorrow (Thursday & Friday June 9 & 10) then resume one tablet daily.   Return to coumadin clinic in one week to be rechecked.

## 2010-11-17 NOTE — Miscellaneous (Signed)
Summary: lasix 40mg  bid  Clinical Lists Changes  Medications: Rx of FUROSEMIDE 40 MG TABS (FUROSEMIDE) Take two tabs once daily;  #6 x 60;  Signed;  Entered by: Burnett Kanaris, CNA;  Authorized by: Lenoria Farrier, MD, [facility];  Method used: Electronically to [facility] [id number], [place], [place], [place]  [address], Ph: [phone], Fax: [phone]    Prescriptions: FUROSEMIDE 40 MG TABS (FUROSEMIDE) Take two tabs once daily  #6 x 60   Entered by:   Burnett Kanaris, CNA   Authorized by:   Lenoria Farrier, MD, [facility]   Signed by:   Burnett Kanaris, CNA on 01/19/2010   Method used:   Electronically to        [facility] [id number] (retail)       [address]       [place], [place]  [address]       Ph: [phone]       Fax: [phone]   RxID:   [id number]

## 2010-11-17 NOTE — Assessment & Plan Note (Signed)
Summary: rov   Primary Provider:  Shaune Leeks MD  CC:  pt complains of sob also fatigue. Called medications out to pt he did not bring a list.  History of Present Illness: Tony Reed is 77 years old and return for followup management of atrial fibrillation and CAD. He initially had a inferior MI treated with a bare-metal stent to the right coronary in 2003. He later had a Cypher stent placed to the posterior branch of the circumflex artery. In September of 2010 he develop symptoms of fatigue and atrial fibrillation. He had a Myoview scan which suggested inferior ischemia and had a catheterization which showed nonobstructive coronary disease and normal LV function. Somehow he missed his followup visit and I didn't see him until February at which time he was in persistent atrial fibrillation. We started him on dronedarone with plans for DC cardioversion if he didn't convert.  He has had symptoms of fatigue and decreased exercise times which have not improved. He is still in atrial fibrillation today. Unfortunately his INRs had not been therapeutic over the past few weeks.  His other problems include hypertension and hyperlipidemia.  Current Medications (verified): 1)  Cardura 8 Mg Tabs (Doxazosin Mesylate) .... Take 1/2 Tab  By Mouth Daily 2)  Iron 325 (65 Fe) Mg Tabs (Ferrous Sulfate) .... Take One By Mouth Twice A Day 3)  Vytorin 10-20 Mg Tabs (Ezetimibe-Simvastatin) .... Take One By Mouth At Night 4)  Nitrolingual 0.4 Mg/spray Soln (Nitroglycerin) .... Use As Needed As Directed 5)  Fosamax 10 Mg Tabs (Alendronate Sodium) .Marland Kitchen.. 1 Daily By Mouth 6)  Metoprolol Tartrate 50 Mg Tabs (Metoprolol Tartrate) .... 1/2  Tab By Mouth Two Times A Day 7)  Ramipril 10 Mg Caps (Ramipril) .... Take 1 Capsule By Mouth Twice A Day 8)  Furosemide 40 Mg Tabs (Furosemide) .... Take One & A Half Tablets Once Daily 9)  Vitamin D3 1000 Unit Caps (Cholecalciferol) .Marland Kitchen.. 1 Daily By Mouth 10)  Warfarin Sodium 5 Mg  Tabs (Warfarin Sodium) .... Use As Directed By Anticoagulation Clinic 11)  Buffered Aspirin 325 Mg Tabs (Aspirin Buf(Cacarb-Mgcarb-Mgo)) .... Take One By Mouth Daily 12)  Multivitamins  Tabs (Multiple Vitamin) .... Take One By Mouth Daily 13)  Multaq 400 Mg Tabs (Dronedarone Hcl) .... Take One Tab By Mouth Two Times A Day  Allergies: 1)  ! Pcn 2)  Naprosyn (Naproxen)  Past History:  Past Medical History: Reviewed history from 06/12/2009 and no changes required. Hypertension Myocardial infarction, hx of Osteopenia Osteoporosis Diverticulosis, colon GERD 1. Coronary artery disease status post diaphragmatic wall infarction     in 2003, treated with stenting to the right coronary with a bare-     metal stent with subsequent elective stenting of the circumflex     artery with a Cypher stent with nonobstructive disease at last     catheterization in 2008. 2. Hypertension, now under good control. 3. Hyperlipidemia. 4. Leg pain and decreased pulses consistent with peripheral vascular     disease. 5. Edema, left lower extremity  Review of Systems       ROS is negative except as outlined in HPI.   Vital Signs:  Patient profile:   77 year old male Height:      71 inches Weight:      206 pounds BMI:     28.84 Pulse rate:   48 / minute BP sitting:   107 / 72  (left arm)  Vitals Entered By: Kem Parkinson (December 30, 2009 9:24 AM)  Physical Exam  Additional Exam:  Gen. Well-nourished, in no distress   Neck: JVD 1 cm above the clavicle, thyroid not enlarged, no carotid bruits Lungs: No tachypnea, clear without rales, rhonchi or wheezes Cardiovascular: Rhythm irregular, PMI not displaced,  heart sounds  normal, no murmurs or gallops, 1+ peripheral edema, pulses normal in all 4 extremities. Abdomen: BS normal, abdomen soft and non-tender without masses or organomegaly, no hepatosplenomegaly. MS: No deformities, no cyanosis or clubbing   Neuro:  No focal sns   Skin:  no lesions      Impression & Recommendations:  Problem # 1:  ATRIAL FIBRILLATION (ICD-427.31) He has persistent atrial fibrillation and symptoms of fatigue and decreased exercise tolerance. We will continue the dronedarone and work toward getting his INR is therapeutic for 4 weeks with plans for cardioversion after that. We'll see him back in 4 weeks. His rate is slow so we'll stop the metoprolol after decreasing the dose to one half of a 50 mg tablet for one week. His updated medication list for this problem includes:    Metoprolol Tartrate 50 Mg Tabs (Metoprolol tartrate) .Marland Kitchen... 1/2  tab by mouth once daily x 1 week then stop    Warfarin Sodium 5 Mg Tabs (Warfarin sodium) ..... Use as directed by anticoagulation clinic    Buffered Aspirin 325 Mg Tabs (Aspirin buf(cacarb-mgcarb-mgo)) .Marland Kitchen... Take one by mouth daily    Multaq 400 Mg Tabs (Dronedarone hcl) .Marland Kitchen... Take one tab by mouth two times a day  Orders: EKG w/ Interpretation (93000) Echocardiogram (Echo) TLB-BMP (Basic Metabolic Panel-BMET) (80048-METABOL)  His updated medication list for this problem includes:    Metoprolol Tartrate 50 Mg Tabs (Metoprolol tartrate) .Marland Kitchen... 1/2  tab by mouth once daily x 1 week then stop    Warfarin Sodium 5 Mg Tabs (Warfarin sodium) ..... Use as directed by anticoagulation clinic    Buffered Aspirin 325 Mg Tabs (Aspirin buf(cacarb-mgcarb-mgo)) .Marland Kitchen... Take one by mouth daily    Multaq 400 Mg Tabs (Dronedarone hcl) .Marland Kitchen... Take one tab by mouth two times a day  Problem # 2:  DIASTOLIC HEART FAILURE, ACUTE ON CHRONIC (ICD-428.33) He has slight JVD and some peripheral edema. His LV function is been normal in the past. We will increase his Lasix from 40 mg 1-1/2 tablets daily to 2 tablets daily. We will get a BMP today. I'll repeat his echocardiogram to reevaluate his LV function. I think the mild diastolic dysfunction which seems been aggravated by the atrial fibrillation is not a contraindication to dornedarone but we will  reevaluate this after receiving his LV function by echo cardiogram. His updated medication list for this problem includes:    Nitrolingual 0.4 Mg/spray Soln (Nitroglycerin) ..... Use as needed as directed    Metoprolol Tartrate 50 Mg Tabs (Metoprolol tartrate) .Marland Kitchen... 1/2  tab by mouth once daily x 1 week then stop    Ramipril 10 Mg Caps (Ramipril) .Marland Kitchen... Take 1 capsule by mouth twice a day    Furosemide 40 Mg Tabs (Furosemide) .Marland Kitchen... Take two tabs once daily    Warfarin Sodium 5 Mg Tabs (Warfarin sodium) ..... Use as directed by anticoagulation clinic    Buffered Aspirin 325 Mg Tabs (Aspirin buf(cacarb-mgcarb-mgo)) .Marland Kitchen... Take one by mouth daily    Multaq 400 Mg Tabs (Dronedarone hcl) .Marland Kitchen... Take one tab by mouth two times a day  Problem # 3:  CAD, NATIVE VESSEL (ICD-414.01) He has had previous PCI procedures as described in the history of  present illness. This prompted stable. His updated medication list for this problem includes:    Nitrolingual 0.4 Mg/spray Soln (Nitroglycerin) ..... Use as needed as directed    Metoprolol Tartrate 50 Mg Tabs (Metoprolol tartrate) .Marland Kitchen... 1/2  tab by mouth once daily x 1 week then stop    Ramipril 10 Mg Caps (Ramipril) .Marland Kitchen... Take 1 capsule by mouth twice a day    Warfarin Sodium 5 Mg Tabs (Warfarin sodium) ..... Use as directed by anticoagulation clinic    Buffered Aspirin 325 Mg Tabs (Aspirin buf(cacarb-mgcarb-mgo)) .Marland Kitchen... Take one by mouth daily  Patient Instructions: 1)  Your physician recommends that you schedule a follow-up appointment in: 4 weeks. 2)  Your physician recommends that you have lab work today: bmet (427.31) 3)  Your physician has recommended you make the following change in your medication: 1) decrease metoprolol to 50mg  1/2 tab once daily for one week then STOP, 2) Increase furosemide to 40mg  two tabs once daily  4)  Your physician has requested that you have an echocardiogram.  Echocardiography is a painless test that uses sound waves to create  images of your heart. It provides your doctor with information about the size and shape of your heart and how well your heart's chambers and valves are working.  This procedure takes approximately one hour. There are no restrictions for this procedure. Prescriptions: MULTAQ 400 MG TABS (DRONEDARONE HCL) take one tab by mouth two times a day  #46 x 0   Entered by:   Sherri Rad, RN, BSN   Authorized by:   Lenoria Farrier, MD, [facility]   Signed by:   Lenoria Farrier, MD, [facility] on 12/30/2009   Method used:   Samples Given   RxID:   [id number]

## 2010-11-17 NOTE — Medication Information (Signed)
Summary: rov/eac  Anticoagulant Therapy  Managed by: Eda Keys, PharmD Referring MD: Charlies Constable, MD PCP: Shaune Leeks MD Supervising MD: Eden Emms MD, Theron Arista Indication 1: Atrial Fibrillation Lab Used: [facility] [place] Site: [address] INR POC 2.5 INR RANGE 2-3  Dietary changes: no    Health status changes: no    Bleeding/hemorrhagic complications: no    Recent/future hospitalizations: no    Any changes in medication regimen? no    Recent/future dental: no  Any missed doses?: no       Is patient compliant with meds? yes       Allergies: 1)  ! Pcn 2)  Naprosyn (Naproxen)  Anticoagulation Management History:      The patient is taking warfarin and comes in today for a routine follow up visit.  Positive risk factors for bleeding include an age of 50 years or older and presence of serious comorbidities.  The bleeding index is 'intermediate risk'.  Positive CHADS2 values include History of CHF, History of HTN, and Age > 75 years old.  His last INR was 1.0 RATIO.  Anticoagulation responsible provider: Eden Emms MD, Theron Arista.  INR POC: 2.5.  Cuvette Lot#: [id number].  Exp: 02/2011.    Anticoagulation Management Assessment/Plan:      The patient's current anticoagulation dose is Warfarin sodium 5 mg tabs: Use as directed by Anticoagulation Clinic.  The target INR is 2.0-3.0.  The next INR is due 01/13/2010.  Anticoagulation instructions were given to patient.  Results were reviewed/authorized by Eda Keys, PharmD.  He was notified by Eda Keys.         Prior Anticoagulation Instructions: INR 2.6  Continue taking 1 tablet every day.  Return to clinic in 1 week.     Current Anticoagulation Instructions: INR 2.5  Continue taking 1 tablet (5 mg) every day.  Return to clinic in 1 week.   Pending cardioversion.

## 2010-11-17 NOTE — Medication Information (Signed)
Summary: rov/tm  Anticoagulant Therapy  Managed by: Bethena Midget, RN, BSN Referring MD: Charlies Constable, MD PCP: Shaune Leeks MD Supervising MD: Antoine Poche MD, Fayrene Fearing Indication 1: Atrial Fibrillation Lab Used: [facility] [place] Site: [address] INR POC 1.2 INR RANGE 2-3  Dietary changes: no    Health status changes: no    Bleeding/hemorrhagic complications: yes       Details: Had one episode of nose bleed on left side that lasted short time.   Recent/future hospitalizations: no    Any changes in medication regimen? no    Recent/future dental: no  Any missed doses?: yes     Details: Pt states he doesn't remember missing a dose  Is patient compliant with meds? yes       Allergies: 1)  ! Pcn 2)  Naprosyn (Naproxen)  Anticoagulation Management History:      The patient is taking warfarin and comes in today for a routine follow up visit.  Positive risk factors for bleeding include an age of 75 years or older and presence of serious comorbidities.  The bleeding index is 'intermediate risk'.  Positive CHADS2 values include History of CHF, History of HTN, and Age > 75 years old.  His last INR was 1.0 RATIO.  Anticoagulation responsible provider: Antoine Poche MD, Fayrene Fearing.  INR POC: 1.2.  Cuvette Lot#: [id number].  Exp: 06/2011.    Anticoagulation Management Assessment/Plan:      The patient's current anticoagulation dose is Warfarin sodium 5 mg tabs: Use as directed by Anticoagulation Clinic.  The target INR is 2.0-3.0.  The next INR is due 05/04/2010.  Anticoagulation instructions were given to patient.  Results were reviewed/authorized by Bethena Midget, RN, BSN.  He was notified by Bethena Midget, RN, BSN.         Prior Anticoagulation Instructions: INR 2.1 Continue 1 pill everyday except 1 1/2 pills on Sundays and Thursdays. Recheck in 2 weeks.   Current Anticoagulation Instructions: INR 1.2 Today and Saturday take 1 1/2 pills then resume to 1 pill everyday except  1 1/2 pills on Sundays and Thursdays. Recheck in 10 days.

## 2010-11-17 NOTE — Medication Information (Signed)
Summary: rov/ewj  Anticoagulant Therapy  Managed by: Reina Fuse, PharmD Referring MD: Charlies Constable, MD PCP: Shaune Leeks MD Supervising MD: Excell Seltzer MD, Casimiro Needle Indication 1: Atrial Fibrillation Lab Used: [facility] [place] Site: [address] INR POC 1.1 INR RANGE 2-3  Dietary changes: no    Health status changes: no    Bleeding/hemorrhagic complications: no    Recent/future hospitalizations: no    Any changes in medication regimen? no    Recent/future dental: no  Any missed doses?: no       Is patient compliant with meds? yes      Comments: INR remains subtherapeutic despite increasing doses of Coumadin. Pt is to bring in all medications next week for a medication review before further dose increases.   Allergies: 1)  ! Pcn 2)  Naprosyn (Naproxen)  Anticoagulation Management History:      The patient is taking warfarin and comes in today for a routine follow up visit.  Positive risk factors for bleeding include an age of 75 years or older and presence of serious comorbidities.  The bleeding index is 'intermediate risk'.  Positive CHADS2 values include History of CHF, History of HTN, and Age > 74 years old.  His last INR was 1.0 RATIO.  Anticoagulation responsible provider: Excell Seltzer MD, Casimiro Needle.  INR POC: 1.1.  Cuvette Lot#: [id number].  Exp: 08/2011.    Anticoagulation Management Assessment/Plan:      The patient's current anticoagulation dose is Warfarin sodium 5 mg tabs: Use as directed by Anticoagulation Clinic.  The target INR is 2.0-3.0.  The next INR is due 08/21/2010.  Anticoagulation instructions were given to patient.  Results were reviewed/authorized by Reina Fuse, PharmD.  He was notified by Reina Fuse PharmD.         Prior Anticoagulation Instructions: INR 1.3  Start taking 1.5 tablets daily except 2 tablets on Wednesdays and Saturdays.  Recheck in 10 days.  Current Anticoagulation Instructions: INR 1.1  Tomorrow, Saturday, October  28th, take Coumadin 3 tabs (15 mg). Sunday, October 29th, take Coumadin 2 tabs (10 mg). Then continue regular schedule of Coumadin 1.5 tabs (7.5 mg) on all days except Coumadin 2 tabs (10 mg) on Wed and Sat. Return to clinic in 1 week. PLEASE BRING ALL MEDICATIONS WITH YOU NEXT WEEK!

## 2010-11-17 NOTE — Assessment & Plan Note (Signed)
Summary: eph   Visit Type:  Follow-up- post DCCV Primary Provider:  Shaune Leeks MD  CC:  The pt complains of occasional dizziness/lightheadedness and SOB. He is s/p DCCV.Marland Kitchen  History of Present Illness: Tpatient is 77 years old and return for management of atrial fibrillation and CAD. He had an inferior MI in 2003 treated with a bare-metal stent to the right coronary artery. Later he had a Cypher stent placed in the circumflex artery. He developed atrial fibrillation last fall and recently we have been attempting to get him back in sinus rhythm. He had symptoms of fatigue and diastolic CHF. We started him on both tach and brought him in for DC cardioversion which was successful.  He's been doing fairly well since then. He does say he still gets short of breath with minimal exertion.  His other problems include hypertension, hyperlipidemia, and peripheral vascular disease with decreased lower extremity pulses.  He has a good ejection fraction at 55-60%. He has chronic renal insufficiency with a creatinine of 1.7.  Current Medications (verified): 1)  Cardura 8 Mg Tabs (Doxazosin Mesylate) .... Take 1/2 Tab  By Mouth Daily 2)  Iron 325 (65 Fe) Mg Tabs (Ferrous Sulfate) .... Take One By Mouth Twice A Day 3)  Simvastatin 80 Mg Tabs (Simvastatin) .... One Tab By Mouth At Night 4)  Nitrolingual 0.4 Mg/spray Soln (Nitroglycerin) .... Use As Needed As Directed 5)  Fosamax 10 Mg Tabs (Alendronate Sodium) .Marland Kitchen.. 1 Daily By Mouth 6)  Ramipril 10 Mg Caps (Ramipril) .... Take 1 Capsule By Mouth Twice A Day 7)  Furosemide 40 Mg Tabs (Furosemide) .... Take Two Tabs Once Daily 8)  Vitamin D3 1000 Unit Caps (Cholecalciferol) .Marland Kitchen.. 1 Daily By Mouth 9)  Warfarin Sodium 5 Mg Tabs (Warfarin Sodium) .... Use As Directed By Anticoagulation Clinic 10)  Buffered Aspirin 325 Mg Tabs (Aspirin Buf(Cacarb-Mgcarb-Mgo)) .... Take One By Mouth Daily 11)  Multivitamins  Tabs (Multiple Vitamin) .... Take One By Mouth  Daily 12)  Multaq 400 Mg Tabs (Dronedarone Hcl) .... Take One Tab By Mouth Two Times A Day  Allergies (verified): 1)  ! Pcn 2)  Naprosyn (Naproxen)  Past History:  Past Medical History: Reviewed history from 06/12/2009 and no changes required. Hypertension Myocardial infarction, hx of Osteopenia Osteoporosis Diverticulosis, colon GERD 1. Coronary artery disease status post diaphragmatic wall infarction     in 2003, treated with stenting to the right coronary with a bare-     metal stent with subsequent elective stenting of the circumflex     artery with a Cypher stent with nonobstructive disease at last     catheterization in 2008. 2. Hypertension, now under good control. 3. Hyperlipidemia. 4. Leg pain and decreased pulses consistent with peripheral vascular     disease. 5. Edema, left lower extremity  Review of Systems       ROS is negative except as outlined in HPI.   Vital Signs:  Patient profile:   77 year old male Height:      71 inches Weight:      205 pounds Pulse rate:   49 / minute Pulse rhythm:   regular BP sitting:   134 / 62  (left arm) Cuff size:   large  Vitals Entered By: Sherri Rad, RN, BSN (Feb 25, 2010 1:16 PM)  Physical Exam  Additional Exam:  Gen. Well-nourished, in no distress   Neck: JVD 2 cm above the clavicle, thyroid not enlarged, no carotid bruits Lungs: No tachypnea,  clear without rales, rhonchi or wheezes Cardiovascular: Rhythm regular, PMI not displaced,  heart sounds  normal, no murmurs or gallops, 1-2+ bilateral peripheral edema, pulses normal in all 4 extremities. Abdomen: BS normal, abdomen soft and non-tender without masses or organomegaly, no hepatosplenomegaly. MS: No deformities, no cyanosis or clubbing   Neuro:  No focal sns   Skin:  no lesions    Impression & Recommendations:  Problem # 1:  ATRIAL FIBRILLATION (ICD-427.31)  He has atrial fibrillation and it is now status post DC cardioversion and has maintained  sinus rhythm on Multaq. We will continue his current treatment. He is also on Coumadin. We will give him prescription to make sure he can afford his new antiarrhythmic. If he cannot, we will consider alternatives.    Warfarin Sodium 5 Mg Tabs (Warfarin sodium) ..... Use as directed by anticoagulation clinic    Buffered Aspirin 325 Mg Tabs (Aspirin buf(cacarb-mgcarb-mgo)) .Marland Kitchen... Take one by mouth daily    Multaq 400 Mg Tabs (Dronedarone hcl) .Marland Kitchen... Take one tab by mouth two times a day  Orders: EKG w/ Interpretation (93000) TLB-BMP (Basic Metabolic Panel-BMET) (80048-METABOL)  Problem # 2:  DIASTOLIC HEART FAILURE, ACUTE ON CHRONIC (ICD-428.33) He is still short of breath status post DC cardioversion and has significant volume overload. I think some of his problem is related to his diastolic heart failure. We will increase his Lasix from 80 mg in the morning to 80 mg in the morning and 40 mg in the afternoon. We'll get a BMP today and in one week and will see him back in 3-4 weeks.  His updated medication list for this problem includes:    Nitrolingual 0.4 Mg/spray Soln (Nitroglycerin) ..... Use as needed as directed    Ramipril 10 Mg Caps (Ramipril) .Marland Kitchen... Take 1 capsule by mouth twice a day    Furosemide 40 Mg Tabs (Furosemide) .Marland Kitchen... Take two tablets in the morning and one tablet in the afternoon.    Warfarin Sodium 5 Mg Tabs (Warfarin sodium) ..... Use as directed by anticoagulation clinic    Buffered Aspirin 325 Mg Tabs (Aspirin buf(cacarb-mgcarb-mgo)) .Marland Kitchen... Take one by mouth daily    Multaq 400 Mg Tabs (Dronedarone hcl) .Marland Kitchen... Take one tab by mouth two times a day  Orders: EKG w/ Interpretation (93000) TLB-BMP (Basic Metabolic Panel-BMET) (80048-METABOL)  Problem # 3:  CAD, NATIVE VESSEL (ICD-414.01) He had an inferior MI in 2003 today with a bare-metal stent to the RCA. He's had no recent chest pain and this problem appears stable. His updated medication list for this problem includes:     Nitrolingual 0.4 Mg/spray Soln (Nitroglycerin) ..... Use as needed as directed    Ramipril 10 Mg Caps (Ramipril) .Marland Kitchen... Take 1 capsule by mouth twice a day    Warfarin Sodium 5 Mg Tabs (Warfarin sodium) ..... Use as directed by anticoagulation clinic    Buffered Aspirin 325 Mg Tabs (Aspirin buf(cacarb-mgcarb-mgo)) .Marland Kitchen... Take one by mouth daily  His updated medication list for this problem includes:    Nitrolingual 0.4 Mg/spray Soln (Nitroglycerin) ..... Use as needed as directed    Ramipril 10 Mg Caps (Ramipril) .Marland Kitchen... Take 1 capsule by mouth twice a day    Warfarin Sodium 5 Mg Tabs (Warfarin sodium) ..... Use as directed by anticoagulation clinic    Buffered Aspirin 325 Mg Tabs (Aspirin buf(cacarb-mgcarb-mgo)) .Marland Kitchen... Take one by mouth daily  Problem # 4:  HYPERTENSION (ICD-401.9) His blood pressure is well controlled on current medications. His updated medication list for  this problem includes:    Cardura 8 Mg Tabs (Doxazosin mesylate) .Marland Kitchen... Take 1/2 tab  by mouth daily    Ramipril 10 Mg Caps (Ramipril) .Marland Kitchen... Take 1 capsule by mouth twice a day    Furosemide 40 Mg Tabs (Furosemide) .Marland Kitchen... Take two tablets in the morning and one tablet in the afternoon.    Buffered Aspirin 325 Mg Tabs (Aspirin buf(cacarb-mgcarb-mgo)) .Marland Kitchen... Take one by mouth daily  His updated medication list for this problem includes:    Cardura 8 Mg Tabs (Doxazosin mesylate) .Marland Kitchen... Take 1/2 tab  by mouth daily    Ramipril 10 Mg Caps (Ramipril) .Marland Kitchen... Take 1 capsule by mouth twice a day    Furosemide 40 Mg Tabs (Furosemide) .Marland Kitchen... Take two tablets in the morning and one tablet in the afternoon.    Buffered Aspirin 325 Mg Tabs (Aspirin buf(cacarb-mgcarb-mgo)) .Marland Kitchen... Take one by mouth daily  Patient Instructions: 1)  Your physician recommends that you schedule a follow-up appointment in: 4 weeks. 2)  Your physician recommends that you have lab work today- bmet [phone]) and in 1 week- bmet [phone]) 3)  Your physician  has recommended you make the following change in your medication: 1) Increase lasix to 80mg  in the morning and 40mg  in the afternoon. 2) Start Potassium one tab by mouth once daily. Prescriptions: FUROSEMIDE 40 MG TABS (FUROSEMIDE) take two tablets in the morning and one tablet in the afternoon.  #90 x 6   Entered by:   Sherri Rad, RN, BSN   Authorized by:   Lenoria Farrier, MD, [facility]   Signed by:   Sherri Rad, RN, BSN on 02/25/2010   Method used:   Electronically to        [facility] [id number] (retail)       [address] [place], [place]  [address]       Ph: [phone]       Fax: [phone]   RxID:   [id number] POTASSIUM CHLORIDE CRYS CR 20 MEQ CR-TABS (POTASSIUM CHLORIDE CRYS CR) Take one tablet by mouth daily  #30 x 6   Entered by:   Sherri Rad, RN, BSN   Authorized by:   Lenoria Farrier, MD, [facility]   Signed by:   Sherri Rad, RN, BSN on 02/25/2010   Method used:   Electronically to        [facility] [id number] (retail)       [address] [place], [place]  [address]       Ph: [phone]       Fax: [phone]   RxID:   [id number]

## 2010-11-17 NOTE — Medication Information (Signed)
Summary: ccr/hm  Anticoagulant Therapy  Managed by: Cloyde Reams, RN, BSN Referring MD: Charlies Constable, MD PCP: Shaune Leeks MD Supervising MD: Jens Som MD, Arlys John Indication 1: Atrial Fibrillation Lab Used: [facility] [place] Site: [address] INR POC 1.6 INR RANGE 2-3  Dietary changes: no    Health status changes: no    Bleeding/hemorrhagic complications: no    Recent/future hospitalizations: no    Any changes in medication regimen? yes       Details: Added Multaq at today's OV.  Recent/future dental: no  Any missed doses?: no       Is patient compliant with meds? yes       Allergies (verified): 1)  ! Pcn 2)  Naprosyn (Naproxen)  Anticoagulation Management History:      The patient is taking warfarin and comes in today for a routine follow up visit.  Positive risk factors for bleeding include an age of 39 years or older.  The bleeding index is 'intermediate risk'.  Positive CHADS2 values include History of HTN and Age > 23 years old.  His last INR was 1.0 RATIO.  Anticoagulation responsible provider: Jens Som MD, Arlys John.  INR POC: 1.6.  Cuvette Lot#: [id number].  Exp: 01/2011.    Anticoagulation Management Assessment/Plan:      The patient's current anticoagulation dose is Warfarin sodium 5 mg tabs: Use as directed by Anticoagulation Clinic.  The target INR is 2.0-3.0.  The next INR is due 12/17/2009.  Anticoagulation instructions were given to patient.  Results were reviewed/authorized by Cloyde Reams, RN, BSN.  He was notified by Cloyde Reams RN.         Prior Anticoagulation Instructions: INR 2.1  Continue on same dosage 1 tablet daily except 1/2 tablet on Tuesdays, Thursdays, and Saturdays.  Recheck in 4 weeks.    Current Anticoagulation Instructions: INR 1.6  Take 1.5 tablets today, then start taking 1 tablet daily except 1/2 tablet on Tuesdays and Saturdays.  Recheck in 1 week.

## 2010-11-19 NOTE — Medication Information (Signed)
Summary: ROV  Anticoagulant Therapy  Managed by: Samantha Crimes, PharmD Referring MD: Charlies Constable, MD PCP: Shaune Leeks MD Supervising MD: Riley Kill MD, Maisie Fus Indication 1: Atrial Fibrillation Lab Used: [facility] [place] Site: [address] INR POC 2.8 INR RANGE 2-3  Dietary changes: no    Health status changes: no    Bleeding/hemorrhagic complications: no    Recent/future hospitalizations: no    Any changes in medication regimen? no    Recent/future dental: no  Any missed doses?: no       Is patient compliant with meds? yes       Allergies: 1)  ! Pcn 2)  Naprosyn (Naproxen)  Anticoagulation Management History:      Positive risk factors for bleeding include an age of 15 years or older and presence of serious comorbidities.  The bleeding index is 'intermediate risk'.  Positive CHADS2 values include History of CHF, History of HTN, and Age > 18 years old.  His last INR was 1.0 RATIO.  Anticoagulation responsible provider: Riley Kill MD, Maisie Fus.  INR POC: 2.8.  Exp: 11/2011.    Anticoagulation Management Assessment/Plan:      The patient's current anticoagulation dose is Warfarin sodium 5 mg tabs: Use as directed by Anticoagulation Clinic.  The target INR is 2.0-3.0.  The next INR is due 10/30/2010.  Anticoagulation instructions were given to patient.  Results were reviewed/authorized by Samantha Crimes, PharmD.         Prior Anticoagulation Instructions: INR:  1.6  Your INR is low today, but you have been eating green leafy vegetables for the past 4 days.  Take 3 tablets tonight and tomorrow night then return to your normal schedule of 1.5 tablets Monday & Thursday, and 2 tablets other days of the week.  Return to clinic in 2 weeks.  Current Anticoagulation Instructions: Continue on current regimen Return to clinic on Jan 13th, at 1015 am

## 2010-11-19 NOTE — Medication Information (Signed)
Summary: [facility] Reed  Anticoagulant Therapy  Managed by: Bethena Midget, RN, BSN Referring MD: Charlies Constable, MD PCP: Shaune Leeks MD Supervising MD: Antoine Poche MD, Fayrene Fearing Indication 1: Atrial Fibrillation Lab Used: [facility] [place] Site: [address] INR POC 2.6 INR RANGE 2-3  Dietary changes: no    Health status changes: no    Bleeding/hemorrhagic complications: no    Recent/future hospitalizations: no    Any changes in medication regimen? no    Recent/future dental: no  Any missed doses?: no       Is patient compliant with meds? yes       Allergies: 1)  ! Pcn 2)  Naprosyn (Naproxen)  Anticoagulation Management History:      The patient is taking warfarin and comes in today for a routine follow up visit.  Positive risk factors for bleeding include an age of 75 years or older and presence of serious comorbidities.  The bleeding index is 'intermediate risk'.  Positive CHADS2 values include History of CHF, History of HTN, and Age > 6 years old.  His last INR was 1.0 RATIO.  Anticoagulation responsible provider: Antoine Poche MD, Fayrene Fearing.  INR POC: 2.6.  Cuvette Lot#: [id number].  Exp: 11/2011.    Anticoagulation Management Assessment/Plan:      The patient's current anticoagulation dose is Warfarin sodium 5 mg tabs: Use as directed by Anticoagulation Clinic.  The target INR is 2.0-3.0.  The next INR is due 11/27/2010.  Anticoagulation instructions were given to patient.  Results were reviewed/authorized by Bethena Midget, RN, BSN.  He was notified by Bethena Midget, RN, BSN.         Prior Anticoagulation Instructions: Continue on current regimen Return to clinic on Jan 13th, at 1015 am  Current Anticoagulation Instructions: INR 2.6 Continue 10mg s everyday except 7.5mg s on Mondays and Thursdays. Recheck in 4 weeks.

## 2010-11-26 ENCOUNTER — Encounter (INDEPENDENT_AMBULATORY_CARE_PROVIDER_SITE_OTHER): Payer: MEDICARE

## 2010-11-26 ENCOUNTER — Encounter: Payer: Self-pay | Admitting: Cardiology

## 2010-11-26 ENCOUNTER — Other Ambulatory Visit: Payer: Self-pay | Admitting: Cardiology

## 2010-11-26 ENCOUNTER — Encounter: Payer: Self-pay | Admitting: Internal Medicine

## 2010-11-26 ENCOUNTER — Ambulatory Visit (INDEPENDENT_AMBULATORY_CARE_PROVIDER_SITE_OTHER): Payer: MEDICARE | Admitting: Cardiology

## 2010-11-26 DIAGNOSIS — I2510 Atherosclerotic heart disease of native coronary artery without angina pectoris: Secondary | ICD-10-CM

## 2010-11-26 DIAGNOSIS — Z7901 Long term (current) use of anticoagulants: Secondary | ICD-10-CM

## 2010-11-26 DIAGNOSIS — I5033 Acute on chronic diastolic (congestive) heart failure: Secondary | ICD-10-CM

## 2010-11-26 DIAGNOSIS — I4891 Unspecified atrial fibrillation: Secondary | ICD-10-CM

## 2010-11-26 DIAGNOSIS — I5032 Chronic diastolic (congestive) heart failure: Secondary | ICD-10-CM | POA: Insufficient documentation

## 2010-11-27 LAB — BASIC METABOLIC PANEL
BUN: 23 mg/dL (ref 6–23)
CO2: 32 mEq/L (ref 19–32)
Chloride: 99 mEq/L (ref 96–112)
Glucose, Bld: 91 mg/dL (ref 70–99)
Potassium: 3.9 mEq/L (ref 3.5–5.1)

## 2010-12-03 NOTE — Medication Information (Signed)
Summary: rov/kh  Anticoagulant Therapy  Managed by: Tammy Sours, PharmD Referring MD: Charlies Constable, MD PCP: Shaune Leeks MD Supervising MD: Johney Frame MD, Fayrene Fearing Indication 1: Atrial Fibrillation Lab Used: [facility] [place] Site: [address] INR POC 3.1 INR RANGE 2-3  Dietary changes: no    Health status changes: no    Bleeding/hemorrhagic complications: no    Recent/future hospitalizations: no    Any changes in medication regimen? no    Recent/future dental: no  Any missed doses?: no       Is patient compliant with meds? yes      Comments: Patient has already taken his coumadin for the day.   Allergies: 1)  ! Pcn 2)  Naprosyn (Naproxen)  Anticoagulation Management History:      The patient is taking warfarin and comes in today for a routine follow up visit.  Positive risk factors for bleeding include an age of 75 years or older and presence of serious comorbidities.  The bleeding index is 'intermediate risk'.  Positive CHADS2 values include History of CHF, History of HTN, and Age > 75 years old.  His last INR was 1.0 RATIO.  Anticoagulation responsible provider: Dermot Gremillion MD, Fayrene Fearing.  INR POC: 3.1.  Cuvette Lot#: [id number].  Exp: 11/2011.    Anticoagulation Management Assessment/Plan:      The patient's current anticoagulation dose is Warfarin sodium 5 mg tabs: Use as directed by Anticoagulation Clinic.  The target INR is 2.0-3.0.  The next INR is due 12/17/2010.  Anticoagulation instructions were given to patient.  Results were reviewed/authorized by Tammy Sours, PharmD.         Prior Anticoagulation Instructions: INR 2.6 Continue 10mg s everyday except 7.5mg s on Mondays and Thursdays. Recheck in 4 weeks.   Current Anticoagulation Instructions: INR 3.1  Take only 1 tablet tomorrow. Then resume taking 2 tablets (10mg ) everyday except 1 and 1/2 tablets (7.5mg ) on Mondays and Thursdays. Recheck INR in 3 weeks.

## 2010-12-03 NOTE — Assessment & Plan Note (Addendum)
Summary: EC6/PER CHECK OUT/SF/AMD appt confirm=mj   Visit Type:  follow-up/ec6 Primary Provider:  Shaune Leeks MD  CC:  shortness of breath and .  History of Present Illness: 77 yo with history of CAD, diastolic CHF, and paroxysmal atrial fibrillation returns for cardiology followup. I am seeing him for the first time today.  He has been followed by Dr. Juanda Chance in the past.  He seems to be symptomatically stable.  He can climb a flight of steps with mild dyspnea at the top.  He can walk on flat ground without shortness of breath.  He has stable L>R lower leg edema.  He is able to do yardwork at his house without much problem.  He walks a mile for exercise.  No chest pain.  He is in sinus rhythm today.   Low resting heart rate but no syncope or lightheadedness.   ECG: NSR at 49  Labs (10/11): K 3.9, creatinine 1.5, LDL 79, HDL 53  Current Medications (verified): 1)  Cardura 8 Mg Tabs (Doxazosin Mesylate) .... Take 1/2 Tab  By Mouth Daily 2)  Simvastatin 80 Mg Tabs (Simvastatin) .... One Tab By Mouth At Night 3)  Nitrostat 0.4 Mg Subl (Nitroglycerin) .... Use Sl As Directed As Needed 4)  Fosamax 10 Mg Tabs (Alendronate Sodium) .Marland Kitchen.. 1 Daily By Mouth 5)  Ramipril 10 Mg Caps (Ramipril) .... Take 1 Capsule By Mouth Twice A Day 6)  Furosemide 40 Mg Tabs (Furosemide) .... Take 2 Tablets in The Morning and 1 Tablet in The Afternoon. 7)  Vitamin D3 1000 Unit Caps (Cholecalciferol) .Marland Kitchen.. 1 Daily By Mouth 8)  Warfarin Sodium 5 Mg Tabs (Warfarin Sodium) .... Use As Directed By Anticoagulation Clinic 9)  Buffered Aspirin 325 Mg Tabs (Aspirin Buf(Cacarb-Mgcarb-Mgo)) .... Take One By Mouth Daily 10)  Multivitamins  Tabs (Multiple Vitamin) .... Take One By Mouth Daily 11)  Potassium Chloride Crys Cr 20 Meq Cr-Tabs (Potassium Chloride Crys Cr) .... Take One Tablet By Mouth Daily 12)  Alendronate Sodium 10 Mg Tabs (Alendronate Sodium) .... Once Daily  Allergies (verified): 1)  ! Pcn 2)  Naprosyn  (Naproxen)  Past History:  Past Medical History: 1. Hypertension 2. CAD: Inferior MI in 2003 with BMS to RCA.  DES to CFX in 2008.  3. Osteoporosis 4. Diverticulosis, colon 5. GERD 6. Hyperlipidemia. 7. Leg pain and decreased pulses consistent with peripheral vascular disease. 8. Paroxysmal atrial fibrillation with history of DCCV.  9. Diastolic CHF: Echo (4/11) with EF 55-60%, moderate MR, moderate to severe LAE, severe RAE, moderate to severe TR. L>R lower leg edema chronically.  10. Some memory difficulty.  11. CKD  Family History: Reviewed history from 07/30/2010 and no changes required. Father: Died unknown age Mother: Died at age 52 leukemia and HTN Siblings: One brother died at age 35 from  smoking and lung cancer. Half brother died at age 68 heart problems, smoker.  Half brother A  42 Smoker  Deaf Half Sister A 32  Social History: Marital Status: Married, lives with wife in [place].  Children: 6 Occupation: Retired Clinical research associate.   Review of Systems       All systems reviewed and negative except as per HPI.   Vital Signs:  Patient profile:   77 year old male Height:      71 inches Weight:      196 pounds BMI:     27.44 Pulse rate:   57 / minute BP sitting:   118 / 67  (left arm)  Cuff size:   large  Vitals Entered By: Caralee Ates CMA (November 26, 2010 2:00 PM)  Physical Exam  General:  Elderly man in no distress.  Neck:  Neck supple, no JVD. No masses, thyromegaly or abnormal cervical nodes. Lungs:  Clear bilaterally to auscultation and percussion. Heart:  Non-displaced PMI, chest non-tender; regular rate and rhythm, S1, S2 without rubs or gallops. 2/6 systolic murmur LLSB and apex.  Carotid upstroke normal, no bruit.  1+ edema 3/4 up on left, 1+ edema at ankle on right.  Abdomen:  Bowel sounds positive; abdomen soft and non-tender without masses, organomegaly, or hernias noted. No hepatosplenomegaly. Extremities:  No clubbing or cyanosis. Neurologic:   Alert and oriented x 3.  Some memory difficulty.  Psych:  Normal affect.   Impression & Recommendations:  Problem # 1:  ATRIAL FIBRILLATION (ICD-427.31) Patient remains in NSR today.  He is on warfarin.  He is not on rate control meds given low resting heart rate.  No history of syncope.   Problem # 2:  DIASTOLIC HEART FAILURE, CHRONIC (ICD-428.32) Stable, NYHA class II symptoms.  Neck veins not elevated.  Stable L>R lower leg edema.  Continue current Lasix dosing.  Will check BMET/BNP today given history of CKD to make sure that creatinine is stable.   Problem # 3:  CAD, NATIVE VESSEL (ICD-414.01) Stable with no ischemic symptoms.  May decrease ASA to 81 mg daily given use of warfarin.   Problem # 4:  HYPERCHOLESTEROLEMIA (236 LDL 170) (ICD-272.0) LDL close to goal (< 70) in 10/11.   Other Orders: TLB-BNP (B-Natriuretic Peptide) (83880-BNPR) TLB-BMP (Basic Metabolic Panel-BMET) (80048-METABOL)  Patient Instructions: 1)  Your physician has recommended you make the following change in your medication:  2)  Decrease Aspirin to 81mg  daily. 3)  Lab today--BMP/BNP 427.31 428.0 4)  Your physician recommends that you schedule a follow-up appointment in: 4 months with Dr Shirlee Latch.   Appended Document: EC6/PER CHECK OUT/SF/AMD appt confirm=mj labs ok

## 2010-12-16 ENCOUNTER — Encounter: Payer: Self-pay | Admitting: Cardiology

## 2010-12-16 DIAGNOSIS — I4891 Unspecified atrial fibrillation: Secondary | ICD-10-CM

## 2010-12-17 ENCOUNTER — Encounter: Payer: Self-pay | Admitting: Internal Medicine

## 2010-12-17 ENCOUNTER — Encounter (INDEPENDENT_AMBULATORY_CARE_PROVIDER_SITE_OTHER): Payer: Medicare Other

## 2010-12-17 DIAGNOSIS — Z7901 Long term (current) use of anticoagulants: Secondary | ICD-10-CM

## 2010-12-17 DIAGNOSIS — I4891 Unspecified atrial fibrillation: Secondary | ICD-10-CM

## 2010-12-24 NOTE — Medication Information (Signed)
Summary: rov/cb  Anticoagulant Therapy  Managed by: Windell Hummingbird, RN Referring MD: Charlies Constable, MD PCP: Shaune Leeks MD Supervising MD: Johney Frame MD, Fayrene Fearing Indication 1: Atrial Fibrillation Lab Used: [facility] [place] Site: [address] INR POC 4.3 INR RANGE 2-3  Dietary changes: no    Health status changes: no    Bleeding/hemorrhagic complications: no    Recent/future hospitalizations: no    Any changes in medication regimen? no    Recent/future dental: no  Any missed doses?: no       Is patient compliant with meds? yes       Allergies: 1)  ! Pcn 2)  Naprosyn (Naproxen)  Anticoagulation Management History:      The patient is taking warfarin and comes in today for a routine follow up visit.  Positive risk factors for bleeding include an age of 71 years or older and presence of serious comorbidities.  The bleeding index is 'intermediate risk'.  Positive CHADS2 values include History of CHF, History of HTN, and Age > 37 years old.  His last INR was 1.0 RATIO.  Anticoagulation responsible provider: Daishon Chui MD, Fayrene Fearing.  INR POC: 4.3.  Cuvette Lot#: [id number].  Exp: 10/2011.    Anticoagulation Management Assessment/Plan:      The patient's current anticoagulation dose is Warfarin sodium 5 mg tabs: Use as directed by Anticoagulation Clinic.  The target INR is 2.0-3.0.  The next INR is due 12/31/2010.  Anticoagulation instructions were given to patient.  Results were reviewed/authorized by Windell Hummingbird, RN.  He was notified by Windell Hummingbird, RN.         Prior Anticoagulation Instructions: INR 3.1  Take only 1 tablet tomorrow. Then resume taking 2 tablets (10mg ) everyday except 1 and 1/2 tablets (7.5mg ) on Mondays and Thursdays. Recheck INR in 3 weeks.   Current Anticoagulation Instructions: INR 4.3 Skip tomorrow's and Saturday's doses. Then continue taking 2 tablets every day, except take 1 1/2 tablets on Mondays and Thursdays. Recheck in 2 weeks.

## 2010-12-31 ENCOUNTER — Encounter (INDEPENDENT_AMBULATORY_CARE_PROVIDER_SITE_OTHER): Payer: Medicare Other

## 2010-12-31 ENCOUNTER — Encounter: Payer: Self-pay | Admitting: Internal Medicine

## 2010-12-31 DIAGNOSIS — I4891 Unspecified atrial fibrillation: Secondary | ICD-10-CM

## 2010-12-31 DIAGNOSIS — Z7901 Long term (current) use of anticoagulants: Secondary | ICD-10-CM

## 2011-01-05 LAB — BASIC METABOLIC PANEL
CO2: 28 mEq/L (ref 19–32)
Chloride: 103 mEq/L (ref 96–112)
GFR calc Af Amer: >60 mL/min (ref >60)
Potassium: 3.7 mEq/L (ref 3.5–5.1)
Sodium: 139 mEq/L (ref 135–145)

## 2011-01-05 LAB — CBC
Hemoglobin: 11.8 g/dL — ABNORMAL LOW (ref 13.0–17.0)
MCHC: 34.8 g/dL (ref 30.0–36.0)
MCV: 95.4 fL (ref 78.0–100.0)
RBC: 3.56 MIL/uL — ABNORMAL LOW (ref 4.22–5.81)

## 2011-01-05 NOTE — Medication Information (Signed)
Summary: rov/pc  Anticoagulant Therapy  Managed by: Bethena Midget, RN, BSN Referring MD: Charlies Constable, MD PCP: Shaune Leeks MD Supervising MD: Johney Frame MD, Fayrene Fearing Indication 1: Atrial Fibrillation Lab Used: [facility] [place] Site: [address] INR POC 3.1 INR RANGE 2-3  Dietary changes: no    Health status changes: no    Bleeding/hemorrhagic complications: no    Recent/future hospitalizations: no    Any changes in medication regimen? no    Recent/future dental: no  Any missed doses?: no       Is patient compliant with meds? yes       Allergies: 1)  ! Pcn 2)  Naprosyn (Naproxen)  Anticoagulation Management History:      The patient is taking warfarin and comes in today for a routine follow up visit.  Positive risk factors for bleeding include an age of 75 years or older and presence of serious comorbidities.  The bleeding index is 'intermediate risk'.  Positive CHADS2 values include History of CHF, History of HTN, and Age > 47 years old.  His last INR was 1.0 RATIO.  Anticoagulation responsible provider: Amore Ackman MD, Fayrene Fearing.  INR POC: 3.1.  Cuvette Lot#: [id number].  Exp: 12/2011.    Anticoagulation Management Assessment/Plan:      The patient's current anticoagulation dose is Warfarin sodium 5 mg tabs: Use as directed by Anticoagulation Clinic.  The target INR is 2.0-3.0.  The next INR is due 01/14/2011.  Anticoagulation instructions were given to patient.  Results were reviewed/authorized by Bethena Midget, RN, BSN.  He was notified by Windell Hummingbird.         Prior Anticoagulation Instructions: INR 4.3 Skip tomorrow's and Saturday's doses. Then continue taking 2 tablets every day, except take 1 1/2 tablets on Mondays and Thursdays. Recheck in 2 weeks.  Current Anticoagulation Instructions: INR 3.1 Tomorrow only take 1 pill then change dose to 2 pills everyday except 1.5 pill on Tuesdays, Thursdays and Saturdays. Recheck in 2 weeks.

## 2011-01-14 ENCOUNTER — Ambulatory Visit (INDEPENDENT_AMBULATORY_CARE_PROVIDER_SITE_OTHER): Payer: Medicare Other | Admitting: *Deleted

## 2011-01-14 DIAGNOSIS — I4891 Unspecified atrial fibrillation: Secondary | ICD-10-CM

## 2011-01-14 DIAGNOSIS — Z7901 Long term (current) use of anticoagulants: Secondary | ICD-10-CM

## 2011-01-15 ENCOUNTER — Other Ambulatory Visit: Payer: Self-pay | Admitting: *Deleted

## 2011-01-15 MED ORDER — POTASSIUM CHLORIDE CRYS ER 20 MEQ PO TBCR: 20.0000 meq | EXTENDED_RELEASE_TABLET | Freq: Every day | ORAL | Status: DC

## 2011-01-22 LAB — CBC
HCT: 34 % — ABNORMAL LOW (ref 39.0–52.0)
Hemoglobin: 11.8 g/dL — ABNORMAL LOW (ref 13.0–17.0)
MCHC: 34.7 g/dL (ref 30.0–36.0)
RBC: 3.58 MIL/uL — ABNORMAL LOW (ref 4.22–5.81)
RDW: 13.8 % (ref 11.5–15.5)

## 2011-01-22 LAB — BASIC METABOLIC PANEL
CO2: 28 mEq/L (ref 19–32)
GFR calc Af Amer: >60 mL/min (ref >60)
GFR calc non Af Amer: 56 mL/min — ABNORMAL LOW (ref >60)
Glucose, Bld: 93 mg/dL (ref 70–99)
Potassium: 3.4 mEq/L — ABNORMAL LOW (ref 3.5–5.1)
Sodium: 139 mEq/L (ref 135–145)

## 2011-02-01 ENCOUNTER — Other Ambulatory Visit: Payer: Self-pay | Admitting: *Deleted

## 2011-02-01 ENCOUNTER — Telehealth: Payer: Self-pay | Admitting: Cardiology

## 2011-02-01 MED ORDER — FUROSEMIDE 40 MG PO TABS: ORAL_TABLET | ORAL | Status: DC

## 2011-02-01 NOTE — Telephone Encounter (Signed)
Received request for refill from walk-in pt form.

## 2011-02-01 NOTE — Telephone Encounter (Signed)
Walk in Pt Form" Pt needs Refill on Medication" sent to Kat[facility]ung 02/01/11/KM

## 2011-02-04 ENCOUNTER — Ambulatory Visit (INDEPENDENT_AMBULATORY_CARE_PROVIDER_SITE_OTHER): Payer: Medicare Other | Admitting: *Deleted

## 2011-02-04 DIAGNOSIS — I4891 Unspecified atrial fibrillation: Secondary | ICD-10-CM

## 2011-02-04 LAB — POCT INR: INR: 2.8

## 2011-03-02 NOTE — Assessment & Plan Note (Signed)
[facility]                            CARDIOLOGY OFFICE NOTE   ROOSEVELT, EIMERS                   MRN:          [id number]  DATE:06/14/2007                            DOB:          27-Feb-1926    PRIMARY CARDIOLOGIST:  Dr. Charlies Constable   PATIENT PROFILE:  A 77 year old Caucasian male with a prior history of  CAD and MI who presents with a roughly 70-month history of progressive  dyspnea, decrease in exercise tolerance, and weekly exertional angina.   PROBLEM LIST:  1. CAD.      a.     March 29, 2002, acute inferior ST-elevation MI with       successful PCI and stenting of the mid RCA and the distal RCA,       with the placement of two 3.0 x 23-mm Zeta bare-metal stents.      b.     June 2003, PCI and stenting of the LPL branch of the left       circumflex with placement of a Cypher drug-eluting stent.      c.     September 27, 2002, cardiac catheterization showing patency       of previously placed stents and otherwise nonobstructive disease,       EF 55%.      d.     February 08, 2007, adenosine Myoview, EF 54%.  There is a fixed       defect consistent with previous infarct in the inferior wall.       There is minimal overlying ischemia in the basilar inferior wall.  2. Hypertension.  3. Hyperlipidemia.  4. BPH.  5. Osteoporosis.  6. Obesity.  7. Cataracts.   HISTORY OF PRESENT ILLNESS:  A 77 year old Caucasian male with the above  problem list.  He was last seen in clinic by Dr. Juanda Chance in April 2008 at  which time he underwent adenosine Myoview testing for preoperative  clearance for cataract surgery.  His nuclear study was negative.  Since  April he has been having progressive dyspnea on exertion, especially  with inclines or steps, and also weekly 5/10 exertional substernal chest  tightness with associated shortness of breath and lightheadedness,  lasting approximately 5 minutes and resolving with rest.  He notes that  his symptoms are  similar in character, however not as severe as his  previous MI pain.  He denies any PND, orthopnea, syncope, or early  satiety.  He does have chronic lower extremity edema that is improved in  the morning and worse as the day progresses.   ALLERGIES:  No known drug allergies.   HOME MEDICATIONS:  1. Aspirin 325 mg daily.  2. Cardura 4 mg daily.  3. Altace 10 mg daily.  4. Vytorin 10/20 mg daily.  5. Alendronate 10 mg daily.  6. Metoprolol 50 mg daily.  7. Amlodipine 5 mg daily.  8. Hydrochlorothiazide 25 mg daily.  9. Nitroglycerin 0.4 mg sublingual p.r.n. chest pain.   FAMILY HISTORY:  Mother died at age 48 of unknown cause.  Father died at  unknown age and cause.  His  oldest brother died at age 49 of lung  cancer.  He does have half brothers and sisters and he says that none of  them have had CAD, stroke or diabetes.   SOCIAL HISTORY:  He lives in [place] with his wife.  He is retired  from [employer].  He denies any tobacco or drug use.  He previously used  alcohol but has not used any in many years.  He does not routinely  exercise.   REVIEW OF SYSTEMS:  Positive for chest pain and dyspnea on exertion as  outlined in the HPI.  Also positive for lightheadedness in association  with his chest pain.  Otherwise, all systems reviewed and negative.   PHYSICAL EXAMINATION:  Blood pressure 116/58, heart rate 53,  respirations 16, his weight is 207 pounds, down 1 pound since his last  visit.  Pleasant white male in no acute distress.  Awake, alert, and  oriented x3.  NECK:  No bruits or JVD.  LUNGS:  Respirations regular and unlabored, clear to auscultation.  CARDIAC:  Regular S1, S2.  There is no S3, S4 or murmurs.  ABDOMEN:  Obese, soft, nontender, nondistended.  Bowel sounds present  x4.  EXTREMITIES:  Warm, dry and pink with 2+ bilateral lower extremity edema  at the ankles.  Dorsalis pedis and posterior tibial pulses are 1+ and  equal bilaterally.  There are no femoral  bruits on exam.   ACCESSORY CLINICAL FINDINGS:  CBC, BMET and PT/PTT are pending.  ECG  shows sinus bradycardia without any acute ST or T changes.   ASSESSMENT:  1. Unstable angina/coronary artery disease.  The patient presents with      a 75-month history of progressive dyspnea on exertion and exertional      chest discomfort concerning for angina.  I have discussed his case      with Dr. Myrtis Ser who has also seen the patient, and we have arranged      for him to undergo left heart cardiac catheterization on Friday,      August 29 with Dr. Excell Seltzer.  He remains on aspirin, beta blocker,      ACE inhibitor, statin therapy, as well as a calcium channel      blocker.  He does have p.r.n. nitrates at home.  2. Hypertension.  This is stable.  Continue current regimen.  3. Hyperlipidemia.  He remains on Vytorin therapy.  4. Benign prostatic hypertrophy.  The patient takes Cardura.  5. History of osteoporosis.  He is on Fosamax at home, which he      tolerates.  6. Disposition.  The patient will undergo catheterization this Friday      and follow up with Dr. Juanda Chance in approximately 2-3 weeks.      Nicolasa Ducking, ANP  Electronically Signed      Luis Abed, MD, [facility]  Electronically Signed   CB/MedQ  DD: 06/14/2007  DT: 06/15/2007  Job #: [id number]

## 2011-03-02 NOTE — Assessment & Plan Note (Signed)
[facility] HEALTHCARE                            CARDIOLOGY OFFICE NOTE   BRAHM, BARBEAU                   MRN:          [id number]  DATE:06/13/2008                            DOB:          07-01-1926    PRIMARY CARE PHYSICIAN:  Arta Silence, MD   CLINICAL HISTORY:  Mr. Buth returns for followup management of his  coronary heart disease.  In 2003, he had a diaphragmatic wall infarction  treated with a bare-metal stent to the right coronary artery.  He  subsequently had a Cypher stent placed in the posterolateral branch of  the circumflex artery.  He was hospitalized last year with a chest pain  and was found to have nonobstructive disease at catheterization.   He has been doing fairly well from a standpoint of his heart.  He has  had no chest pain or palpitations, but he does say he gives out of  breath easily.   He also says that when he walks his legs gave out easily and the left  leg is worse than his right and he has developed some increased edema in  his left lower extremity.   His past medical history is significant for hypertension and  hyperlipidemia.   His current medications include:  1. Amlodipine 5 mg daily.  2. Ramipril 10 mg daily.  3. Doxazosin.  4. Vytorin 10/20.  5. Metoprolol 50 mg one-half tablet daily.  6. Aspirin.  7. Iron.  8. Furosemide 20 mg daily.   On examination, the blood pressure was 171/67 and the pulse was 48 and  regular.  There was no venous distention.  The carotid pulses were full  without bruits.  Chest was clear without rales or rhonchi.  Cardiac  rhythm was regular.  No murmurs, rubs, or gallops.  Abdomen was soft  without organomegaly.  I had difficulty feeling pulses in the left foot.  There was also 2+ edema in the left lower extremity with some erythema  in the lower pretibial area.   IMPRESSION:  1. Coronary artery disease status post diaphragmatic wall infarction      in 2003,  treated with stenting to the right coronary with a bare-      metal stent with subsequent elective stenting of the circumflex      artery with a Cypher stent with nonobstructive disease at last      catheterization in 2008.  2. Hypertension, now under good control.  3. Hyperlipidemia.  4. Leg pain and decreased pulses consistent with peripheral vascular      disease.  5. Edema, left lower extremity rule out deep vein thrombophlebitis.   RECOMMENDATIONS:  We will plan to get arterial Dopplers of both lower  extremities to evaluate him for peripheral vascular disease.  We will  get venous Dopplers of the left lower extremity to rule out deep vein  thrombophlebitis.  We need to check on his medicines, but we will plan  to increase his ramipril from 10 to 20 mg a day.  We will get repeat  blood pressure check on his followup visit and get a BMP  on his followup  visit.     Bruce Elvera Lennox Juanda Chance, MD, [facility]  Electronically Signed    BRB/MedQ  DD: 06/13/2008  DT: 06/14/2008  Job #: [id number]

## 2011-03-02 NOTE — Assessment & Plan Note (Signed)
[facility] HEALTHCARE                            CARDIOLOGY OFFICE NOTE   ROHNAN, BARTLESON                   MRN:          [id number]  DATE:06/27/2007                            DOB:          08-Nov-1925    PRIMARY CARE PHYSICIAN:  Arta Silence, MD   CLINICAL HISTORY:  Nial Hawe returns for a followup visit after  his recent catheterization. He had been having symptoms of chest pain  over the last several weeks and was seen by Ward Givens and Jerral Bonito  and scheduled for a catheterization last week when I was out of town.  Dr.  Excell Seltzer did the catheterization and found only non-obstructive  disease. He has stents in the right coronary artery and a CYPHER stent  in the circumflex artery which were patent.   He has done well since that time. He really had minimal chest pain since  then and no shortness of breath or palpitations.   PAST MEDICAL HISTORY:  1. Hypertension.  2. Hyperlipidemia.   CURRENT MEDICATIONS:  1. Amlodipine.  2. Hydrochlorothiazide.  3. Ramipril.  4. Cardura.  5. Vytorin.  6. Metoprolol. The metoprolol was decreased in dosage because of a      slow heart rate.  7. Alendronate.   PHYSICAL EXAMINATION:  Blood pressure is 121/66, pulse 56 and regular.  There was no venous distention. The carotid pulses were full without  bruits.  CHEST: Was clear.  CARDIAC: Rhythm was regular. I hear no murmur or gallops.  ABDOMEN: Was soft.  The right femoral artery site was well-healed, otherwise some  superficial ecchymosis. There was trace to 1+ peripheral edema and the  pedal pulses were equal.   IMPRESSION:  1. Coronary artery disease, status post prior diaphragmatic wall      infarction in 2003 treated with stenting of the right coronary      artery with bare metal stent and subsequent elective stenting of      the circumflex artery with a CYPHER stent with non-obstructive      disease at last catheterization.  2.  Hypertension, under good control.  3. Hyperlipidemia.   RECOMMENDATIONS:  I think Mr. Schirtzinger is doing well. We left him on  the lower dose of metoprolol. We left his other medications the same. I  will plan to see him back in followup in a year.     Bruce Elvera Lennox Juanda Chance, MD, [facility]  Electronically Signed    BRB/MedQ  DD: 06/27/2007  DT: 06/27/2007  Job #: [id number]   cc:   Arta Silence, MD

## 2011-03-02 NOTE — Cardiovascular Report (Signed)
NAME:  DANIELE, YANKOWSKI            ACCOUNT NO.:  [id number]   MEDICAL RECORD NO.:  [id number]          PATIENT TYPE:  OIB   LOCATION:  1961                         FACILITY:  [facility]   PHYSICIAN:  Veverly Fells. Excell Seltzer, MD  DATE OF BIRTH:  [birth year]   DATE OF PROCEDURE:  DATE OF DISCHARGE:                            CARDIAC CATHETERIZATION   PROCEDURE:  Left heart catheterization, selective coronary angiography,  left ventricular angiography.   INDICATIONS:  Mr. Kirschenmann is a 77 year old gentleman who has been  cared for by Dr. Juanda Chance.  He presented in 2003 with an inferior MI.  He  was treated with a bare metal stent to the right coronary artery.  In a  staged fashion, he underwent drug-eluting stent placement in the left in  the posterolateral branch of the left circumflex.  He has been doing  well over the last several years but has developed exertional dyspnea  and chest discomfort over the last few months.  He was seen by Ward Givens and Jerral Bonito in the office and with his history of CAD and  progressive symptoms, he was referred for cardiac catheterization.   The risks and indications of the procedure were reviewed with the  patient and informed consent was obtained.  The right groin was prepped,  draped, and anesthetized with 1% lidocaine. Using the modified Seldinger  technique, a  4-French sheath was placed in the right femoral artery.  Multiple views of the right and left coronary arteries were performed.  Using standard 4-French Judkins catheter, a pigtail catheter was  inserted into the left ventricle were pressures were recorded, a left  ventriculogram was performed and a pullback across the aortic valve was  done.  All catheter exchanges were performed over a guidewire.  There  were no immediate complications.   FINDINGS:  Aortic pressure 141/62 with mean of 94, left ventricular  pressure 139/19.   CORONARY ANGIOGRAPHY:  The left mainstem is a short segment that is  angiographically normal.  There is mild calcification.  The left main  divides into the LAD and left circumflex.   The LAD is a medium-size vessel that courses down and just reaches the  LV apex.  There is diffuse calcification throughout the proximal and mid  portions of the vessel.  Proximally there is minor 20% stenosis followed  by 40-50% stenosis at the first septal perforator.  Beyond that in the  mid-LAD, there is diffuse 30-40% stenosis.  There are two small diagonal  branches from the mid portion of the LAD and a second third diagonal  distribution.  There is no significant angiographic disease in those  vessels.   The left circumflex is large-caliber.  It is diffusely diseased.  The  proximal vessel has serial 20-30% stenoses.  There are small first and  second OM branches present that have no significant disease.  The mid  portion of the left circumflex has a focal 50% stenosis.  There is a  widely patent stent and a large left posterolateral branch of the  circumflex which has no evidence of restenosis.  That branch vessel  divides into  two vessels supplying the posterolateral wall and they have  no significant angiographic stenosis.  There is an atrial branch  provided from the mid portion of the left circumflex.  There is no high-  grade obstructive disease in the left circumflex.   The right coronary artery is diffusely diseased. Proximally there is a  30-40% stenosis.  The mid portion of the vessel is stented and the stent  has minor restenosis, no greater than 30%.  Just beyond the stented  segment, there is a 50% stenosis and the distal portion of the vessel  has 20-30% stenoses.  The right coronary artery divides into a PDA  branch and posterolateral branch.  Both are medium sized vessels and the  branch extends to nearly the apex of the left ventricle.  There is no  angiographic stenosis in those vessels.   Left ventriculography demonstrates normal LV systolic  function with an  LVEF estimated at 60%.  There is no mitral regurgitation.   ASSESSMENT:  1. Diffuse nonobstructive disease throughout the left anterior      descending.  2. Widely patent stent in the left circumflex with diffuse      nonobstructive disease in that vessel.  3. Widely patent stent in the right coronary artery with only mild      restenosis and nonobstructive disease throughout the remainder of      the vessel.  4. Normal left ventricular function.  5. No aortic stenosis or mitral regurgitation.   DISCUSSION:  Mr. Su Hilt and has diffuse CAD.  He does not have any  focal high-grade obstructive lesions.  I would recommend continued  medical therapy.  We will review his findings with Dr. Juanda Chance.      Veverly Fells. Excell Seltzer, MD  Electronically Signed     MDC/MEDQ  D:  06/16/2007  T:  06/17/2007  Job:  [id number]   cc:   Everardo Beals. Juanda Chance, MD, [facility]  Nicolasa Ducking, ANP

## 2011-03-04 ENCOUNTER — Ambulatory Visit (INDEPENDENT_AMBULATORY_CARE_PROVIDER_SITE_OTHER): Payer: Medicare Other | Admitting: *Deleted

## 2011-03-04 DIAGNOSIS — I4891 Unspecified atrial fibrillation: Secondary | ICD-10-CM

## 2011-03-05 NOTE — Cardiovascular Report (Signed)
[place]  Patient:    Tony Reed, Tony Reed Visit Number: [id number] MRN: [id number]          Service Type: MED Location: 2000 2002 01 Attending Physician:  Lenoria Farrier Dictated by:   Everardo Beals Juanda Chance, M.D. [facility] Proc. Date: 03/29/02 Admit Date:  03/29/2002   CC:         Laurita Quint, M.D. [facility]  Cardiopulmonary Lab   Cardiac Catheterization  CLINICAL HISTORY:  The patient is 77 years old and has no prior history of known heart disease.  He had been having some intermittent chest pain for 24-76 hr, but developed the onset of severe pain at 11 a.m. today.  He called EMS and was brought to [facility].  His EKG showed inferior ST elevation and anterior ST depression.  He was seen promptly and transported to the cardiac catheterization laboratory for intervention.  He was consented for the [facility] trial.  DESCRIPTION OF PROCEDURE:  The procedure was performed by the right femoral artery with arterial sheath and 6-French preformed coronary catheters. A venous sheath was placed for access in the right femoral vein.  A front wall arterial puncture was performed and Omnipaque contrast was used.  At the completion of the diagnostic study we made a decision to proceed with intervention on the right coronary artery.  The patient had been given heparin in the emergency room, which prolonged the ACT to greater than 300 sec.  Also he was given double bolus Integrilin and infusion.  He was enrolled in the [facility] trial and was randomized to No Device.  We used a 7-French JR4 guiding catheter with side holes, and a short floppy wire.  We crossed the lesions in the mid and distal right coronary artery without difficulty.  We used a 3.5 x 20 mm Quantum Maverick and performed one inflation of 6 atm for 31 sec in the mid lesion, and one inflation of 6 atm for 30 sec in the distal lesion.  We then deployed a 3.0 x 23 mm Zeta stent  in the distal lesion, ending just before the posterior descending branch and deploying this with one inflation of 12 atm for 49 sec.  We then deployed a second Zeta stent; which was also 3.0 x 23 mm long.  We deployed this with one inflation of 14 atm for 38 sec in the mid vessel.  We the post-dilated with a 3.5 x 15 mm Quantum Maverick, performing three inflations up to 12 atm for 34-37 sec.  This expanded the entire length of the stent.  The patient tolerated the procedure well and left the laboratory in satisfactory condition.  RESULTS: 1. LEFT MAIN CORONARY ARTERY:  Free of significant disease. 2. LEFT ANTERIOR DESCENDING ARTERY:  Had moderate calcification.  It noted    to give rise to three septal perforators and two diagonal branches.    There is 50% proximal stenosis and 80% stenosis in the mid vessel. 3. CIRCUMFLEX ARTERY:  Gave rise to two marginal branches, an atrial    branch and two posterolateral branches.  There is 50% narrowing    between the two marginal branches.  There was 50% narrowing in the    proximal portion of the second marginal branch.  There was 80%    narrowing before the large posterolateral branch. 4. RIGHT CORONARY ARTERY:  A large dominant vessel, that gave rise to a    conus branch, a right ventricular branch, a small and  large posterior    branch and two posterolateral branches. There was 50% proximal    stenosis.  There was 95% stenosis in the mid vessel with some thrombus,    with TIMI-3 flow. There was 80% narrowing in the distal vessel just    before the posterior descending branch.  LEFT VENTRICULOGRAM:  Performed in the RAO projection, showed akinesis of the inferobasilar segment and hypokinesis of the inferior segment out to near the apex.  The tip of the apex of the anterolateral wall moved well.  The estimated ejection fraction was 55%.  DISPOSITION:  Following stenting of the distal lesion in the right coronary artery, the stenosis  improved from 80% to less than 10%.  Following stenting of the lesion in the mid right coronary artery the stenosis improved from 95% to less than 10%.  The flow was TIMI-3 before and after intervention.  At the end of the procedure there was good ST resolution on the monitor, and a good blush for angiography.  HEMODYNAMICS: 1. Aortic Pressure:  159/96 with mean of 125. 2. Left Ventricular Pressure:  159/42.  CONCLUSION: 1. Acute diaphragmatic wall infarction, with 50% proximal and 95% mid    and 80% distal stenosis in the right coronary artery.  There was    50% proximal stenosis in the circumflex artery, with 50% stenosis in    the second marginal branch and 80% stenosis in the distal circumflex    artery.  Also 50% stenosis in the proximal LAD and 80% in the mid LAD,    with inferior wall hypo- to akinesis. 2. Successful deployment of tandem stents in the mid and distal right    coronary artery; with improvement in the distal stenosis from 80% to    less than 10%, and improvement in the mid stenosis from 95% to less    than 10%.  FINAL DISPOSITION:  The patient sent to post-anesthesia unit for further observation.  He followed the [facility] trial.  We plan to discharge probably on day #3, with plans to readmit him at 10-14 days for a follow-up scan as part of the [facility] trial and intervention on the distal circumflex and possible mid LAD lesions. Dictated by:   Everardo Beals Juanda Chance, M.D. [facility] Attending Physician:  Lenoria Farrier DD:  03/29/02 TD:  04/01/02 Job: [id number]

## 2011-03-05 NOTE — Discharge Summary (Signed)
[place]  Patient:    [name], Tony Reed Visit Number: [id number] MRN: [id number]          Service Type: MED Location: 2000 2002 01 Attending Physician:  Lenoria Farrier Dictated by:   Joellyn Rued, P.A.-C. Admit Date:  03/29/2002 Disc. Date: 04/02/02   CC:         Tony Reed. Tony Reed, M.D. [facility]  Tony Reed, M.D. [facility]   Referring Physician Discharge [place]  DATE OF BIRTH:  [birth year]  ADMITTING PHYSICIAN:  Tony Reed. Tony Reed, M.D.  DISCHARGING PHYSICIAN:  Tony Reed, M.D.  SUMMARY OF HISTORY:  [name] is a 77 year old white male, who presented with substernal chest discomfort that occurred intermittent during the preceding night.  Around 11 a.m., on the day of admission, it developed to unremitting chest discomfort associated with shortness of breath.  He has a history of hypertension.  LABORATORY DATA:  Initial CK was 169 with MB 9.5, and a relative index of 5.6. Troponin was .35.  Sodium 140, potassium 3.4,bun 8, creatinine 1.0, glucose 97, normal LFTs.  H&H 13.9 and 40.3, normal indices, platelets 220, WBC 5.2. PT 12.8, PTT 30.  EKG on admission showed normal sinus rhythm, ST elevation consistent with acute inferior lateral myocardial infarction.  He also had ST segment depression in V1 through V2 and ST segment elevation V3 through V6. SubsequENT EKGs showed evolving inferior myocardial infarction pattern with early repolarization.  HOSPITAL COURSE:  Mr. Ruark was taken emergently to the cardiac catheterization laboratory by Dr. Juanda Reed.  According to his progress note, he had a 50% proximal LAD, 80% mid LAD, 50% proximal circumflex, 50% OM-2, 80% distal circumflex, 60% proximal RCA, 95% mid RCA, 80% distal RCA with EF 55%, inferior akinesis toward the base, inferior hypokinesis toward the apex. Angioplasty stenting was performed to the mid and distal RCA lesions, reducing them to less than 10%.  He was  placed in the [facility] trial.  Postsheath removal and bed rest, he up, ambulating in the hall without difficulty.  He had not had any further chest discomfort; his blood pressure was controlled. Lipids were ordered; however, are still pending at the time of this dictation. Cardiac rehab assisted in ambulation and education.  By April 01, 2002, it was felt that he could be discharged home. It was noted that on the afternoon of March 31, 2002, he had an episode of SVT which was asymptomatic.  DISCHARGE DIAGNOSES: 1. Acute inferolateral myocardial infarction, status post emergent stenting to    the right coronary artery x 2, as previously described during the cardiac    catheterization. 2. Supraventricular tachycardia.  DISPOSITION:  He is discharged home.  NEW PRESCRIPTIONS: 1. Plavix 75 mg 1 q.d. at least for 1 month. 2. Lopressor 50 mg 1/2 tab b.i.d. 3. Altace 2.5 mg b.i.d. 4. Pravachol 40 mg q.h.s. 5. He was asked to continue his coated aspirin 325 q.d. 6. Nitroglycerin p.r.n. 7. Cardura 4 mg q.h.s. 8. He was instructed not to take his Capoten.  DISCHARGE INSTRUCTIONS: 1. He was advised no lifting, driving, sexual activity, or heavy exertion    until seen by the physician. 2. Maintain a low salt, low fat, low cholesterol diet. 3. If he has any problems with his catheterization site, he was asked to call. 4. The office will call with arrangements for him to undergo his rest    Cardiolite this week per the [facility] trial. 5. On Friday, April 13, 2002, he will  report to the short stay center to    undergo probable intervention on residual coronary artery disease. 6. He was instructed that he may take his medications in the morning; however,    nothing else to eat or drink after midnight on the 26th. 7. In the office follow-up, he will also need at some point, in approximately    6-8 weeks, fasting lipids and LFTs to follow up on the initiation of    Pravachol. Dictated by:   Joellyn Rued, P.A.-C. Attending Physician:  Lenoria Farrier DD:  04/01/02 TD:  04/02/02 Job: [id number]

## 2011-03-05 NOTE — Assessment & Plan Note (Signed)
[facility] HEALTHCARE                            CARDIOLOGY OFFICE NOTE   MINH, ROANHORSE                   MRN:          [id number]  DATE:02/01/2007                            DOB:          Nov 27, 1925    REFERRING PHYSICIAN:  Carin Hock. Epes, M.D.   CARDIAC CONSULTATION   PRIMARY CARE PHYSICIAN:  Arta Silence, MD.   REASON FOR REFERRAL:  Preoperative evaluation prior to cataract surgery.   CLINICAL COURSE:  Mr. Anguiano is 77 years old and had a diaphragmatic  wall infarction in 2003 treated with stenting of the right coronary  artery with subsequent elective stenting of the circumflex artery. He  had a CYPHER stent placed in the posterolateral branch of the circumflex  artery. He had  stents placed in the right coronary artery. He has done  fairy well since that time although 1 month ago he had a prolonged  episode of chest pain lasting several hours. He says he has had no  recurrent pain since that time and recently he has had no palpitations  or shortness of breath.   He is in need of cataract surgery and he recently saw Dr. Hetty Ely for a  general medical evaluation. Because of his recent chest pain, he  arranged for him to be seen here for further clearance and further  evaluation if needed.   PAST MEDICAL HISTORY:  Significant for hypertension and hyperlipidemia.   CURRENT MEDICATIONS:  Aspirin, Pravachol, Toprol, Cardura, Altace,  Vytorin.   SOCIAL HISTORY:  Mr. Amsler does not smoke. He is retired. He used to  work [employer] officer. He lives with his wife.   FAMILY HISTORY:  Positive for heart disease and he had a half-brother  who died at age 32 of heart problems.   REVIEW OF SYSTEMS:  Positive for arthritic complaints.   PHYSICAL EXAMINATION:  VITAL SIGNS:  Blood pressure is 188/70, the pulse  53 and regular.  NECK:  There was no vein distention. Carotid pulses were full and there  were no bruits.  CHEST:  Clear  without rales or rhonchi.  HEART:  Rhythm was regular. I could hear no murmurs or gallops.  ABDOMEN:  Soft with normal bowel sounds. There was no  hepatosplenomegaly.  EXTREMITIES:  Peripheral pulses were full. There was 1 to 2+ peripheral  edema.  MUSCULOSKELETAL:  Showed no deformities.  SKIN:  Warm and dry.  NEUROLOGIC:  Showed no focal neurological signs.   An electrocardiogram  showed sinus bradycardia and was otherwise normal.   IMPRESSION:  1. Preoperative evaluation prior to cataract surgery.  2. Recent episode of prolonged chest pain, rule out ischemia.  3. Coronary artery disease status post prior diaphragmatic wall      infarction treated with stenting of the right coronary artery x2      with bare metal stents in June 2003 with subsequent elective      stenting of the distal circumflex artery with a CYPHER stent.  4. Hypertension treated.  5. Hyperlipidemia treated.   RECOMMENDATIONS:  Mr. Hinners had an episode of prolonged chest pain a  month ago  which could have been ischemic and he has not had a Myoview  scan in some time. We will plan a stress adenosine Myoview scan. His  blood pressure is also elevated today and we will add Norvasc 5 mg and  hydrochlorothiazide 25 mg to his current regimen. He should have a BNP  in a week. If his Myoview scan is negative, then we will clear him for  cardiac surgery and will plan to see him back in a year.     Bruce Elvera Lennox Juanda Chance, MD, [facility]  Electronically Signed    BRB/MedQ  DD: 02/01/2007  DT: 02/01/2007  Job #: [id number]

## 2011-03-05 NOTE — Discharge Summary (Signed)
NAME:  Tony Reed, Tony Reed                      ACCOUNT NO.:  [id number]   MEDICAL RECORD NO.:  [id number]                   PATIENT TYPE:  INP   LOCATION:  4729                                 FACILITY:  [facility]   PHYSICIAN:  Charlies Constable, M.D.                  DATE OF BIRTH:  1926/03/16   DATE OF ADMISSION:  11/21/2003  DATE OF DISCHARGE:                           DISCHARGE SUMMARY - REFERRING   PROCEDURE:  None.   REASON FOR ADMISSION:  Please refer to dictated admission note.   LABORATORY DATA:  Serial cardiac markers normal. Hemoglobin 11.3, hematocrit  35 (MCV 80), otherwise normal CBC. INR 1.0. Potassium 4.1, BUN 9, creatinine  0.9, elevated AST 88, ALT 46, otherwise normal liver panel, normal  amylase/lipase.   Admission chest x-ray:  Mild cardiac enlargement, essential vascular  congestion/bilateral bronchitic changes; no focal  consolidation/edema/effusion or pneumothorax.   CT of the abdomen:  Normal kidneys, mild pancreatic atrophy; moderately  large hiatal hernia. CT of pelvis:  Marked prostate gland enlargement.   HOSPITAL COURSE:  The patient was admitted for overnight observation and  rule out of myocardial infarction.   All serial cardiac markers were normal; thus, plan was to proceed with  outpatient evaluation with a stress Cardiolite. Of note, the patient is  scheduled to undergo a colonoscopy on February 27, and arrangements will  thus be made to schedule the Cardiolite prior to February 15 for  preoperative clearance.   No medication adjustments made during this brief stay.   DISCHARGE MEDICATIONS:  The patient was instructed to resume previous home  medication regimen.   INSTRUCTIONS:  1. The patient is scheduled for an adenosine Cardiolite on Tuesday, February     8, at 8 a.m.  2. The patient will follow up with Dr. Charlies Constable on Friday, February 11,     at 4:30 p.m.   DISCHARGE DIAGNOSES:  1. Chest pain.     A. Negative serial cardiac markers.     B. Scheduled for adenosine Cardiolite November 26, 2003.  2. Coronary artery disease.     A. Status post inferior myocardial infarction/stent right coronary artery        and circumflex June 2003.     B. History of normal left ventricular function.  3. Gastroesophageal reflux disease/large hiatal hernia.  4. Dyslipidemia.  5. Hypertension.  6. Normocytic anemia.  7. Mildly elevated liver enzymes.      Gene Serpe, P.A. [facility]                      Charlies Constable, M.D.    GS/MEDQ  D:  11/22/2003  T:  11/22/2003  Job:  [id number]   cc:   Laurita Quint, M.D.  [address] [place]  [place] [address]  Fax: [phone]

## 2011-03-05 NOTE — Cardiovascular Report (Signed)
NAME:  CHALMERS, IDDINGS                      ACCOUNT NO.:  [id number]   MEDICAL RECORD NO.:  [id number]                   PATIENT TYPE:  OIB   LOCATION:  2869                                 FACILITY:  [facility]   PHYSICIAN:  Charlies Constable, M.D. [facility]              DATE OF BIRTH:  March 21, 1926   DATE OF PROCEDURE:  09/27/2002  DATE OF DISCHARGE:                              CARDIAC CATHETERIZATION   PROCEDURE PERFORMED:  Cardiac catheterization.   CLINICAL HISTORY:  The patient is 77 years old and six months ago had a  diaphragmatic wall infarction treated with stents in the mid and distal  right coronary artery as part of the EMERALD trial.  About a week later, he  had elective stenting with a Cypher stent of the posterolateral branch of  the circumflex artery.  Over the last month, he has developed some  exertional chest pain and was saw him in the office and arranged for him to  come in for reevaluation.   DESCRIPTION OF PROCEDURE:  The procedure was performed via the right femoral  artery using an arterial sheath and 6 French preformed coronary catheters.  A front wall arterial puncture was performed and Omnipaque contrast was  used. A distal aortogram was performed to rule out abdominal aortic  aneurysm.  The right femoral artery was closed with Perclose.  The patient  tolerated the procedure well and left the laboratory in satisfactory  condition.   RESULTS:  The left main coronary artery:  The left main coronary artery was  free of significant disease.   Left anterior descending:  The left anterior descending artery gave rise to  three septal perforators and diagonal branch.  There was 40% narrowing in  the proximal LAD.  There were irregularities but no other major obstruction.   Circumflex artery:  The circumflex artery gave rise to a marginal branch, an  AV branch and a large posterolateral branch.  There was 50% narrowing in the  proximal vessel.  In one view this appeared  to be hypodense, but in the  other views it did not appear to be tight. It is possible that it could have  been somewhat more than 50%. There was also a 70% narrowing in the mid  circumflex artery.  There was 0% stenosis at the stent site in the  posterolateral branch of the right coronary artery.   Right coronary artery:  The right coronary is a moderate sized vessel that  gave rise to a right ventricular branch, a posterior descending branch and a  posterolateral branch.  The right coronary artery was diffusely irregular  with 50% and 40% proximal stenoses.  There was 50% stenosis within the stent  and the mid vessel.  There was 70% narrowing in the distal vessel and there  was less than 20% narrowing in the stent in the very distal portion of the  right coronary artery.  The rest of  the vessel was free of major  obstruction.   LEFT VENTRICULOGRAPHY:  The left ventriculogram performed in the RAO  projection showed good wall motion with no evidence of hypokinesis.  The  estimated ejection fraction was 55%.   DISTAL AORTOGRAM:  A distal aortogram was performed which showed patent  renal arteries and no significant aortoiliac obstruction.   CONCLUSIONS:  Coronary artery disease, status post diaphragmatic myocardial  infarction treated with two stents six months ago, and status post elective  stenting of the distal circumflex artery with 40% narrowing in the left  anterior descending, 50% proximal, 70% mid stenosis in the circumflex artery  with 0% stenosis at the stent site in the distal circumflex artery, 50%  stenosis in the proximal right coronary artery with 50% in-stent re-stenosis  in the mid right coronary artery, and 70% stenosis in the distal right  coronary artery with less than 20% stenosis at the stent in the distal right  coronary artery with normal left ventricular function.   RECOMMENDATIONS:  There does not appear to be any critical lesion that  appears flow-limiting.   We will plan continued medical therapy.  Because of  the patient's exertional chest pain, we will get a Cardiolite scan to be  sure we are not underestimating severity of any of these lesions, and I will  plan to see him back in a couple of weeks.                                                 Charlies Constable, M.D. [facility]    BB/MEDQ  D:  09/27/2002  T:  09/28/2002  Job:  [id number]   cc:   Laurita Quint, M.D.  [address] [place]  [place] [address]  Fax: [phone]   Cardiopulmonary Laboratory

## 2011-03-05 NOTE — Procedures (Signed)
[place]  Patient:    Tony Reed, Tony Reed Visit Number: [id number] MRN: [id number]          Service Type: CAT Location: [id number] Attending Physician:  Lenoria Farrier Dictated by:   Everardo Beals Juanda Chance, M.D. [facility] Proc. Date: 04/17/02 Admit Date:  04/17/2002 Discharge Date: 04/18/2002   CC:         Laurita Quint, M.D. [facility]   Procedure Report  CLINICAL HISTORY:  The patient is 77 years old and had an inferior wall infarction on June 12 treated with placement of two nonoverlapping stents in the right coronary artery.  He was enrolled in the Centracare trial and was randomized to no device.  He is brought back today for elective intervention on the circumflex artery which had an 80% lesion in the distal portion.  There was also a 70% lesion in the mid LAD which we elected not to treat.  DESCRIPTION OF PROCEDURE:  The procedure was performed via the right femoral artery using an arterial sheath and #6 [place] preformed coronary catheters.  A front wall arterial puncture was performed and Omnipaque contrast was used. We took a picture of the right coronary artery and documented that the two stents were open.  We then took a picture with a left ventriculogram which showed good LV function with no areas of hypokinesis.  We then approached the circumflex artery.  The patient was given Angiomax bolus and infusion.  We used a Voda 3.5 guiding catheter with sideholes, #7 [place], and a short Floppy wire.  We crossed the lesion in the distal circumflex artery without difficulty.  We direct stented with a 3.0 x 18-mm Cypher stent deploying this with one inflation of 10 atmospheres for 35 seconds.  We then post dilated with a 3.5 x 15-mm PowerSail, performing one inflation of 16 atmospheres for 35 seconds.  Repeat diagnostic studies were then performed through the guiding catheter.  The patient tolerated the procedure well and left the laboratory  in satisfactory condition.  RESULTS:  Initially the stenosis in the distal circumflex artery was estimated at 80%.  Following stenting, this improved to less than 10%.  CONCLUSIONS:  Successful stenting of the distal lesion in the circumflex artery with a Cypher stent with improvement in percent diameter narrowing from 80% to less than 10%.  DISPOSITION:  The patient was returned to the post angioplasty unit for further observation. Dictated by:   Everardo Beals Juanda Chance, M.D. [facility] Attending Physician:  Lenoria Farrier DD:  04/17/02 TD:  04/18/02 Job: [id number]

## 2011-03-05 NOTE — H&P (Signed)
NAME:  Tony Reed, LLANAS                      ACCOUNT NO.:  [id number]   MEDICAL RECORD NO.:  [id number]                   PATIENT TYPE:  INP   LOCATION:  1827                                 FACILITY:  [facility]   PHYSICIAN:  Joellyn Rued, P.A. [facility]              DATE OF BIRTH:  03/28/1926   DATE OF ADMISSION:  11/21/2003  DATE OF DISCHARGE:                                HISTORY & PHYSICAL   PRIMARY CARE PHYSICIAN:  Dr. __________ at [facility].   CARDIOLOGIST:  Dr. Charlies Constable.   UROLOGIST:  Dr. Vonita Moss.   GASTROINTESTINAL PHYSICIAN:  Dr. Lina Sar.   SUMMARY OF HISTORY:  [name] is a 77 year old white male who is  transferred via EMS from [facility] oncology center secondary to chest  discomfort. Mr. Cuff stated that he had a test scheduled by Dr.  Vonita Moss to evaluate what sounds like microcytic hematuria this morning.  This morning, he drank his oral contrast, and while driving to oncology  center and while waiting at the oncology center for his test, he developed  anterior chest and abdominal pulling pain. He stated that the discomfort  doubled him over and was associated with bilateral hand numbness, shortness  of breath, diaphoresis, dizziness, and a faint feeling. He did not have any  associated nausea and vomiting. He took some nitroglycerin without relief.  At that time, he told the staff at [facility] of his symptoms. He stated  that staff at [facility] did a bunch of things; he is not sure what, but  the discomfort gradually eased off within the next 30 to 45 minutes. He gave  it a scale of 10 on a scale of 0 to 10. He also seems to think it was  slightly pleuritic, and he did have some associated tenderness. He feels the  discomfort is different from his myocardial infarction in June 2003. At that  time, he had prior tightness in his chest which he described as indigestion  and did not have any associated symptoms. He states he uses nitroglycerin  infrequently; the last time he used it was approximately four to five weeks  ago.   ALLERGIES:  There is a question of a PENICILLIN allergy.   CURRENT MEDICATIONS:  1. Toprol-XL 50 mg half a tablet q.d.  2. Altace 10 mg q.d.  3. Pravachol 40 mg q.h.s.  4. Cardura 8 mg q.h.s.  5. Aspirin 81 mg q.d.  6. Nitroglycerin 0.4 spray as needed.   PAST MEDICAL HISTORY:  1. Hypertension.  2. Hyperlipidemia.  3. GERD.  4. BPH.  5. Testosterone deficiency.   He was hospitalized in June of 2003 with an acute inferior myocardial  infarction and was enrolled in __________ trial. At that time, he received a  stent to the proximal and distal RCA. His EF at that time was 50%. During  that hospitalization, he did have some SVT which was treated with  medications. His last catheterization for chest discomfort was in December  2003. At that time, he had an EF of 55%, no wall motion abnormalities, no  peripheral vascular disease or renal artery stenosis. He had a 50/40%  proximal LAD lesion, 50% mid RCA lesion at the prior stent, 70% distal RCA  lesion, and less than 20% distal RCA lesion at the prior stent. He also had  a 40% proximal LAD, 50% proximal circumflex, 70% mid circumflex, and a  distal circumflex stent did not show any restenosis. The discharge summary  at that time called for an outpatient Cardiolite; however, office records  are pending at the time of this dictation.   SOCIAL HISTORY:  He resides in [place] with his wife. He is a retired  Producer, television/film/video. He had never smoked, consumed alcohol, or used drugs or  herbal medication. He does maintain a low salt diet and walks approximately  one mile every day and occasionally uses a stationary bike without  difficulty.   FAMILY HISTORY:  His mother died at the age of 52. He is not clear of the  etiology. He states that she was in a coma. They possibly found cancer, and  there is a questionable history of hypertension. He does not  know anything  about his father's health. He has one brother deceased with lung cancer, two  half brothers, and one half sister are alive and well.   REVIEW OF SYSTEMS:  Notable for occasional headaches which are unchanged  from previously. He wears glasses. He has upper and lower dentures. He feels  that his chronic dyspnea on exertion has been slightly worse over the two to  three years and states that he cannot get up and down the stairs as he used  to. He has been recently treated with over-the-counter medications for the  flu and has a residual nonproductive cough. According to the patient, he has  hematuria that is microscopic on urinalysis per Dr. Vonita Moss. All other  systems negative.   PHYSICAL EXAMINATION:  GENERAL:  Well-developed, well-nourished, elderly  white male in no apparent distress; however, his general appearance appear  somewhat pale.  VITAL SIGNS:  Blood pressure 139/66, respirations 14 and regular, pulse 50,  100% saturation on 2 liters.  HEENT:  Essentially unremarkable. Normocephalic. PERRLA. EOMs intact.  Sclerae were clear. He does have his dentures in place.  NECK:  Supple without thyromegaly, adenopathy, JVD, masses, or carotid  bruits.  CHEST:  Symmetric excursion.  LUNGS:  Clear to auscultation bilaterally.  HEART:  Regular rate and rhythm, normal S1 and S2 without gallop or murmur.  All pulses were symmetrical and intact without bruits.  SKIN:  Did not have any integument compromise, rashes, or lesions.  ABDOMEN:  Soft, nontender. Bowel sounds are present without organomegaly.  EXTREMITIES:  Negative clubbing, cyanosis, or edema. Again, peripheral  pulses were symmetrical and intact.  MUSCULOSKELETAL:  Did not show any deformities.  NEUROLOGICAL:  Alert and oriented x3. Cranial nerves grossly intact.   STUDIES:  Chest x-ray showed cardiomegaly. No active disease. EKG showed sinus bradycardia, normal axis, ventricular rate is 56, normal intervals,   and early repolarization. Old EKG was not available for comparison.   LABORATORY DATA:  Pending at the time of this dictation.   IMPRESSION:  Atypical chest discomfort may be related to the ingestion of  his contrast material. Initial EKG does not show any acute changes.  Microscopic hematuria currently being evaluated by Dr. Vonita Moss. Apparently  after discussing with Dr. Enos Fling nurse, he was scheduled for abdominal  and pelvic CT scan with and without contrast at [facility] oncology center.  History as previously mentioned in the past medical history. After reviewing  with Dr. Juanda Chance, Dr. Juanda Chance evaluated and examined the patient. It was felt  that he should be admitted for observation. We will check an amylase and  lipase. Rule out myocardial infarction. I have called for office records for  review. If he has not had a recent Cardiolite and his enzymes are negative,  we will discharge him home in the morning and arrange an outpatient  Cardiolite next week and followup with Dr. Juanda Chance. I spoke with Dr.  Vonita Moss, and he states that if we feel it is acceptable he can proceed with  his abdominal and pelvic CT scan with and without contrast. This will also  be arranged.                                                Joellyn Rued, P.A. [facility]    EW/MEDQ  D:  11/21/2003  T:  11/21/2003  Job:  [id number]   cc:   Lina Sar, M.D. [facility]   [employer]. Vonita Moss, M.D.  [address]  [place]  [place] [address]  Fax: [phone]

## 2011-03-05 NOTE — Assessment & Plan Note (Signed)
[facility]                           [place] OFFICE NOTE   Tony Reed, Tony Reed                   MRN:          [id number]  DATE:01/12/2007                            DOB:          September 30, 1926    Here for preop physical exam for cataract removal at [facility] by  Dr. Jettie Pagan.  Patient has had a previous cataract removal of the left eye  in 2000, now to have the right eye done.  Has no real complaints today  but is very stoic and cancelled his initial appointment because of chest  pain.  Patient has ongoing angina for which he uses nitroglycerin  occasionally.  The nitroglycerin has been used fairly routinely,  sometimes once a month.  He has never needed to use it twice until the  other night when he was virtually sitting in his chair,  had chest pain  and used nitroglycerin spray.  Five minutes later, he repeated it.  That  is the first time that he has had to repeat it in some time.  He feels  well, and the pain went away at that point.  He has had no problems  since that time but relates that feeling without  nausea or diaphoresis  at the time. He was not exerting himself at all.  Typically he goes  weeks, if not months, without using nitroglycerin.  He was last seen by  cardiology by Dr. Juanda Chance in March, 2004.  He had an Adenosine Cardiolite  in February, 2005 that showed inferior thinning and prior small infarct  due to his MI in June, 2003.  Had stenting in July, 2003 in the distal  circumflex, and cath in December of that year showed the stents to be  open.  Past medical history is otherwise significant for high blood  pressure since 1973, elevated cholesterol since 1986, hiatal hernia via  upper GI in 1986, and polypectomy by Dr. Marney Setting around that time,  testosterone deficiency since February, 1999.  He has benign prostatic  hypertrophy since 1999, diverticulosis in February, 2005,  B12  deficiency, ongoing since December, 2004,   hematuria with a negative  workup by Dr. Vonita Moss in February, 2005 and osteopenia of the hip with  osteoporosis of the spine via DEXA scan in November, 2005, now showing  to have osteoporosis of the hip and osteopenia of the spine with a DEXA  scan done this month.  He had a hemorrhoidectomy in the remote past, a  hernia repair in 1952, and a cholecystectomy in 1996.   The patient has an allergy to NAPROSYN, which causes abdominal pain and  diarrhea.   His most recent DEXA in March of this year, as above.  He also had a  DEXA in September, 2005, as delineated above.  He had a most recent  colonoscopy in February, 2005 showing benign polyps and diverticula with  internal hemorrhoids.  He had a CT of the abdomen in February, 2005 that  showed a moderate hiatal hernia.  A CT of the pelvis showed an enlarged  prostate.  He was hospitalized in February, 2005 for  chest pain, from  which he did rule out, and a cystoscopy in December, 2004 by Dr.  Vonita Moss that was normal.   MEDICATIONS:  1. Metoprolol 50 mg 1/2 tablet a day.  2. Altace 10 mg a day.  3. Enteric-coated 81 mg aspirin a day.  4. Cardura 8 mg a day.  5. Iron 325 mg b.i.d.  6. B12 injections monthly.  7. Vytorin 10/20 1 daily.  8. Fosamax 10 mg a day.  9. Nitroglycerin spray that he uses, as above.   REVIEW OF SYSTEMS:  Occasional headaches that are mild.  Occasional  dizziness with rising up quickly, which has gotten better in the recent  past.  He wears glasses since 1979.  His last exam was this month.  Again, he is scheduled for cataract removal.  He has presumed early  glaucoma.  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  He has full  dentures up and down and has not been to the dentist in quite some time.  He has shortness of breath with exertion and is better, except for  getting up quickly and giving out quickly when he exerts.  Chest pain is   reasonably stable, although it took two sprays in his most recent  episode.  He sleeps with the head of bed elevated.  He had a  hemorrhoidectomy in the past that occasionally has minimal bright red  blood with hemorrhoids with known hiatal hernia.  He uses Zantac fairly  rarely at this point.  He has nocturia 2-3 times a night with urinary  frequency and decreased stream.  He has no urinary incontinence and has  had blood in his urine occasionally for many years with a negative  workup.  Otherwise, head, eyes, ears, nose, throat, teeth and gums,  respiratory, gastrointestinal, and genitourinary systems are  noncontributory.   SOCIAL HISTORY:  Does not smoke or drink.  Is completely retired at this  point.  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  He is married and lives with his wife.  Has six  children, out of the home.   FAMILY HISTORY:  Father died, unknown age, unknown cause, as he was  divorced and patient did not know him.  Mother died at the age of 67  with leukemia and high blood pressure.  One brother is deceased at 55  years of age from smoking with lung cancer.  A half brother recently  died at 81 years of age with heart problems.  Another half brother is  alive and well, as is a half sister.   His last tetanus shot was in December, 2004.  He had a pneumonia shot in  August, 2000.  He had a flu shot most recently in October, 2006.   PHYSICAL EXAMINATION:  GENERAL:  This is a well-developed and well-  nourished, mildly obese 77 year old white male in no acute distress.  VITAL SIGNS:  Temperature 98.1, pulse 64, blood pressure 140/90 with a  weight of 205 pounds with shoes at a height of 68 inches with allergy to  NAPROSYN.  HEENT:  Conjunctivae are clear.  HEENT is within normal limits.  NECK:  Without adenopathy.  Thyroid is without nodularity.  LUNGS:  Clear to auscultation. BACK: Straight and nontender with no CVA  tenderness.  HEART:  Regular rate and rhythm without murmur.  Pulses are 2+  throughout.  EXTREMITIES:  The upper extremities are 1+ and the lower extremities.  Carotids are without bruits.  CHEST:  Symmetric with good excursion.  ABDOMEN:  Soft and nontender with good bowel sounds.  No masses.  LYMPH NODES:  Not felt throughout.  GENITOURINARY:  Testicles are descended bilaterally without nodularity.  No hernias or adenopathy are noted.  RECTAL:  Normal tone.  Guaiac-negative stool.  No mass.  Prostate is 30  gm, smooth, symmetric.  Raphe is intact without nodularity.  Muscle  strength is 5/5 with range of motion, gait and mobility normal.  SKIN:  Significant for actinic keratosis and seborrheic keratosis  liberally throughout the torso.   Laboratory data was discussed.  CBC was stable with his white count  minimally decreased at 4.3 and his H&H at 12.1 and 35.1.  Basic  metabolic panel was normal.  Hepatic profile was normal except for alk  phos at 35 units per liter.  Cholesterol profile:  Triglycerides are 58,  HDL is 47, and his LDL was 67.  TSH was normal.  Ferritin and B12 levels  were normal.   ASSESSMENT:  1. Chest pain with a long history of angina, status post myocardial      infarction and a Cardiolite in 2005 that was normal.  Needs re-      evaluation and then clearance for cataract surgery.  2. Hypertension, marginal control.  Increase his metoprolol to 50 mg      at night.  Continue the Altace at 10 mg in the morning.  3. Elevated cholesterol is under good control.  4. Anemia with B12 of long history.  Is stable at this point.  5. Osteoporosis of the hip:  Continue the Fosamax.  Try to walk as      much as possible.   He is cleared for surgery from my perspective.  He will need to be  cleared by cardiology.   I have referred him to Dr. Juanda Chance to be seen on the 16th of April at 10  in the morning.  We will have him return in two months to see me with a  blood  pressure check and a check of his PSA, which was not done at this  time.  He is to return sooner if symptoms require.     Arta Silence, MD  Electronically Signed    RNS/MedQ  DD: 01/12/2007  DT: 01/12/2007  Job #: [id number]   cc:   Everardo Beals. Juanda Chance, MD, [facility]  Charles R. Epes, M.D.

## 2011-03-05 NOTE — Discharge Summary (Signed)
[facility]  Patient:    [name], Tony Reed Visit Number: [id number] MRN: [id number]          Service Type: CAT Location: [id number] Attending Physician:  Lenoria Farrier Dictated by:   Joellyn Rued, P.A.-C. Admit Date:  04/17/2002 Discharge Date: 04/18/2002                    Referring Physician Discharge [place]  DATE OF BIRTH:  [birth year]  SUMMARY OF HISTORY:  During that admission, he was enrolled in the [facility] trial.  Now he is readmitted for possible intervention on the circumflex and LAD.  The patient does report intermittent chest discomfort since his discharge similar to his prior MI but not as severe.  Occasionally relieved with rest and/or nitroglycerin.  It is usually precipitated by exertion.  LABORATORY DATA:  Admission PT was 13.9, PTT 31.   Sodium 138, potassium 3.5, BUN 14, creatinine 1.0, glucose 106.  H&H 12.5 and 36.9, normal indices, platelets 209, WBCs 4.0.  Postprocedure H&H was 11.5 and 34.0, normal indices, platelets 194, WBCs 4.9. PT 13.9, PTT 31.  Sodium 140, potassium 4.2, glucose 101, BUN 9, creatinine 1.0.  Magnesium 1.9.  CK total and MB postprocedure was 69 and 2.2.  EKGs showed normal sinus rhythm, sinus bradycardia, T wave inversion in lead 3 and aVF.  HOSPITAL COURSE:  Mr. Northup underwent cardiac catheterization by Dr. Charlies Constable.  Apparently angioplasty stenting was performed to the circumflex lesion.  Post sheath removal and bed rest, his cath site was intact, and he was ambulating without difficulty.  After review, Dr. Juanda Chance felt that he could be discharged home on the 2nd.  DISCHARGE DIAGNOSES: 1. Unstable angina. 2. Residual coronary artery disease from his previous catheterization,    associated with his acute inferior myocardial infarction on March 29, 2002.    Status post angioplasty stenting on the circumflex. 3. History as previously.  DISPOSITION:  He is discharged  home.  DISCHARGE MEDICATIONS: 1. He was asked to continue his Plavix 75 mg q.d. 2. Coated aspirin 325 mg q.d. 3. Altace 2.5 mg b.i.d. 4. Pravachol 40 mg q.h.s. 5. Cardura 4 mg q.h.s. 6. Sublingual nitroglycerin p.r.n. 7. It was noted that his Lopressor 25 mg b.i.d. was discontinued secondary to    sinus bradycardia, and he was asked to begin Toprol XL 25 mg q.d.  DISCHARGE INSTRUCTIONS: 1. He was advised no lifting, driving, sexual activity, or heavy exertion for    two days. 2. Maintain a low salt, fat, cholesterol diet. 3. If he has any problems with his catheterization site, he was asked to call    immediately. 4. Again, he was reminded not to take his Lopressor. 5. He will see Dr. Juanda Chance in the office on May 08, 2002, at 2:45 p.m. Dictated by:   Joellyn Rued, P.A.-C. Attending Physician:  Lenoria Farrier DD:  04/18/02 TD:  04/18/02 Job: [id number]

## 2011-03-09 ENCOUNTER — Other Ambulatory Visit: Payer: Self-pay | Admitting: *Deleted

## 2011-03-09 MED ORDER — SIMVASTATIN 80 MG PO TABS: 80.0000 mg | ORAL_TABLET | Freq: Every day | ORAL | Status: DC

## 2011-03-18 ENCOUNTER — Encounter: Payer: Self-pay | Admitting: Cardiology

## 2011-03-19 ENCOUNTER — Ambulatory Visit (INDEPENDENT_AMBULATORY_CARE_PROVIDER_SITE_OTHER): Payer: Medicare Other | Admitting: *Deleted

## 2011-03-19 DIAGNOSIS — I4891 Unspecified atrial fibrillation: Secondary | ICD-10-CM

## 2011-03-19 LAB — POCT INR: INR: 1.6

## 2011-03-30 ENCOUNTER — Ambulatory Visit (INDEPENDENT_AMBULATORY_CARE_PROVIDER_SITE_OTHER): Payer: Medicare Other | Admitting: *Deleted

## 2011-03-30 ENCOUNTER — Encounter: Payer: Self-pay | Admitting: Cardiology

## 2011-03-30 ENCOUNTER — Ambulatory Visit (INDEPENDENT_AMBULATORY_CARE_PROVIDER_SITE_OTHER): Payer: Medicare Other | Admitting: Cardiology

## 2011-03-30 VITALS — BP 94/55 | HR 56 | Resp 14 | Ht 71.0 in | Wt 198.0 lb

## 2011-03-30 DIAGNOSIS — I509 Heart failure, unspecified: Secondary | ICD-10-CM

## 2011-03-30 DIAGNOSIS — I4891 Unspecified atrial fibrillation: Secondary | ICD-10-CM

## 2011-03-30 DIAGNOSIS — I2510 Atherosclerotic heart disease of native coronary artery without angina pectoris: Secondary | ICD-10-CM

## 2011-03-30 DIAGNOSIS — E785 Hyperlipidemia, unspecified: Secondary | ICD-10-CM

## 2011-03-30 DIAGNOSIS — I5032 Chronic diastolic (congestive) heart failure: Secondary | ICD-10-CM

## 2011-03-30 LAB — POCT INR: INR: 2.9

## 2011-03-30 NOTE — Patient Instructions (Signed)
Schedule an appointment for fasting lab in the next week or so--lipid profile/liver profile/BMP 428.32  414.01  Your physician wants you to follow-up in: 4 months with Dr Shirlee Latch.(October 2012).You will receive a reminder letter in the mail two months in advance. If you don't receive a letter, please call our office to schedule the follow-up appointment.

## 2011-03-31 DIAGNOSIS — E785 Hyperlipidemia, unspecified: Secondary | ICD-10-CM | POA: Insufficient documentation

## 2011-03-31 NOTE — Assessment & Plan Note (Signed)
Check lipids/LFTs with goal LDL < 70.  He is on simvastatin 80 but has been on this dose for well over a year with no side effects.

## 2011-03-31 NOTE — Assessment & Plan Note (Signed)
Patient remains in NSR today.  He is on warfarin.  He is not on rate control meds given low resting heart rate.  No history of syncope.

## 2011-03-31 NOTE — Assessment & Plan Note (Signed)
Weight is stable, NYHA class II symptoms.  Continue current dose of Lasix.  Will get BMET to follow creatinine.

## 2011-03-31 NOTE — Progress Notes (Signed)
PCP: Dr. Hetty Ely  77 yo with history of CAD, diastolic CHF, and paroxysmal atrial fibrillation returns for cardiology followup.  Tony Reed seems to be symptomatically stable.  Tony Reed can climb a flight of steps with mild dyspnea at the top.  Tony Reed can walk on flat ground without shortness of breath.  Tony Reed has stable L>R lower leg edema.  Tony Reed is able to do yardwork at his house without much problem.  Tony Reed walks a mile for exercise 3-4 times a week.  No chest pain.  Tony Reed is in sinus rhythm today on exam.   Low resting heart rate but no syncope or lightheadedness.   Labs (10/11): K 3.9, creatinine 1.5, LDL 79, HDL 53 Labs (2/12): K 3.9, creatinine 1.3, BNP 267  Allergies (verified):  1)  ! Pcn 2)  Naprosyn (Naproxen)  Past Medical History: 1. Hypertension 2. CAD: Inferior MI in 2003 with BMS to RCA.  DES to CFX in 2008.  3. Osteoporosis 4. Diverticulosis, colon 5. GERD 6. Hyperlipidemia. 7. Leg pain and decreased pulses consistent with peripheral vascular disease. 8. Paroxysmal atrial fibrillation with history of DCCV.  9. Diastolic CHF: Echo (4/11) with EF 55-60%, moderate MR, moderate to severe LAE, severe RAE, moderate to severe TR. L>R lower leg edema chronically.  10. Some memory difficulty.  11. CKD  Family History: Father: Died unknown age Mother: Died at age 59 leukemia and HTN Siblings: One brother died at age 19 from  smoking and lung cancer. Half brother died at age 68 heart problems, smoker.  Half brother A  16 Smoker  Deaf Half Sister A 40  Social History: Marital Status: Married, lives with wife in [place].  Children: 6 Occupation: Retired Clinical research associate.   Current Outpatient Prescriptions  Medication Sig Dispense Refill  . alendronate (FOSAMAX) 10 MG tablet Take 10 mg by mouth daily. Take with a full glass of water on an empty stomach.       Marland Kitchen aspirin (ASPIR-81) 81 MG EC tablet Take 81 mg by mouth daily.        . Cholecalciferol (VITAMIN D3) 1000 UNITS CAPS Take by mouth daily.          Marland Kitchen doxazosin (CARDURA) 8 MG tablet Take 1/2 tab qd      . furosemide (LASIX) 40 MG tablet Take 2 tablets in the morning and 1 tablet in the evening  90 tablet  6  . Multiple Vitamins-Minerals (MULTIVITAL) tablet Take 1 tablet by mouth daily.        . nitroGLYCERIN (NITROSTAT) 0.4 MG SL tablet Place 0.4 mg under the tongue as directed. As needed        . potassium chloride SA (K-DUR,KLOR-CON) 20 MEQ tablet Take 1 tablet (20 mEq total) by mouth daily.  30 tablet  11  . ramipril (ALTACE) 10 MG capsule Take 10 mg by mouth 2 (two) times daily.        . simvastatin (ZOCOR) 80 MG tablet Take 1 tablet (80 mg total) by mouth at bedtime.  30 tablet  5  . warfarin (COUMADIN) 5 MG tablet Take by mouth as directed.          BP 94/55  Pulse 56  Resp 14  Ht 5\' 11"  (1.803 m)  Wt 198 lb (89.812 kg)  BMI 27.62 kg/m2 General:  Elderly man in no distress.  Neck:  Neck supple, no JVD. No masses, thyromegaly or abnormal cervical nodes. Lungs:  Clear bilaterally to auscultation and percussion. Heart:  Non-displaced PMI, chest non-tender; regular  rate and rhythm, S1, S2. +S4. 2/6 systolic murmur LLSB and apex.  Carotid upstroke normal, no bruit.  1+ edema 3/4 up on left, 1+ edema at ankle on right.  Abdomen:  Bowel sounds positive; abdomen soft and non-tender without masses, organomegaly, or hernias noted. No hepatosplenomegaly. Extremities:  No clubbing or cyanosis. Neurologic:  Alert and oriented x 3.  Some memory difficulty.  Psych:  Normal affect.

## 2011-03-31 NOTE — Assessment & Plan Note (Signed)
Stable with no ischemic symptoms. Continue ASA 81 and simvastatin.

## 2011-04-08 ENCOUNTER — Telehealth: Payer: Self-pay | Admitting: *Deleted

## 2011-04-08 ENCOUNTER — Other Ambulatory Visit (INDEPENDENT_AMBULATORY_CARE_PROVIDER_SITE_OTHER): Payer: Medicare Other | Admitting: *Deleted

## 2011-04-08 DIAGNOSIS — I2510 Atherosclerotic heart disease of native coronary artery without angina pectoris: Secondary | ICD-10-CM

## 2011-04-08 DIAGNOSIS — E785 Hyperlipidemia, unspecified: Secondary | ICD-10-CM

## 2011-04-08 DIAGNOSIS — I509 Heart failure, unspecified: Secondary | ICD-10-CM

## 2011-04-08 DIAGNOSIS — I5032 Chronic diastolic (congestive) heart failure: Secondary | ICD-10-CM

## 2011-04-08 LAB — HEPATIC FUNCTION PANEL
ALT: 22 U/L (ref 0–53)
AST: 28 U/L (ref 0–37)
Albumin: 4.5 g/dL (ref 3.5–5.2)
Alkaline Phosphatase: 38 U/L — ABNORMAL LOW (ref 39–117)
Total Protein: 7.2 g/dL (ref 6.0–8.3)

## 2011-04-08 LAB — LIPID PANEL
Cholesterol: 158 mg/dL (ref 0–200)
HDL: 49.8 mg/dL (ref >39.00)
Total CHOL/HDL Ratio: 3
Triglycerides: 88 mg/dL (ref 0.0–149.0)

## 2011-04-08 LAB — BASIC METABOLIC PANEL
CO2: 31 mEq/L (ref 19–32)
Calcium: 8.9 mg/dL (ref 8.4–10.5)
Creatinine, Ser: 1.3 mg/dL (ref 0.4–1.5)
Sodium: 139 mEq/L (ref 135–145)

## 2011-04-08 MED ORDER — POTASSIUM CHLORIDE CRYS ER 20 MEQ PO TBCR: 20.0000 meq | EXTENDED_RELEASE_TABLET | Freq: Two times a day (BID) | ORAL | Status: DC

## 2011-04-08 MED ORDER — ATORVASTATIN CALCIUM 80 MG PO TABS: 80.0000 mg | ORAL_TABLET | Freq: Every day | ORAL | Status: DC

## 2011-04-08 NOTE — Telephone Encounter (Signed)
Notes Recorded by Jacqlyn Krauss, RN on 04/08/2011 at 7:31 PM It talked with pt. He will increase KCL to 40 mEq daily. He will stop simvastatin and start atorvastatin 40mg  daily. He will return for fasting lipid/liver profile 06/09/11 ------  Notes Recorded by [name], MD on 04/08/2011 at 6:25 PM Increase KCl to 40. Stop simvastatin 80, start atorvastatin 80 with lipids/LFTs in 2 months.

## 2011-04-20 ENCOUNTER — Ambulatory Visit (INDEPENDENT_AMBULATORY_CARE_PROVIDER_SITE_OTHER): Payer: Medicare Other | Admitting: *Deleted

## 2011-04-20 DIAGNOSIS — I4891 Unspecified atrial fibrillation: Secondary | ICD-10-CM

## 2011-04-20 LAB — POCT INR: INR: 2.9

## 2011-05-04 ENCOUNTER — Other Ambulatory Visit: Payer: Self-pay | Admitting: *Deleted

## 2011-05-04 MED ORDER — RAMIPRIL 10 MG PO CAPS: 10.0000 mg | ORAL_CAPSULE | Freq: Two times a day (BID) | ORAL | Status: DC

## 2011-05-18 ENCOUNTER — Ambulatory Visit (INDEPENDENT_AMBULATORY_CARE_PROVIDER_SITE_OTHER): Payer: Medicare Other | Admitting: *Deleted

## 2011-05-18 DIAGNOSIS — I4891 Unspecified atrial fibrillation: Secondary | ICD-10-CM

## 2011-05-18 LAB — PROTIME-INR: INR: 10.1 ratio (ref 0.8–1.0)

## 2011-05-21 ENCOUNTER — Ambulatory Visit (INDEPENDENT_AMBULATORY_CARE_PROVIDER_SITE_OTHER): Payer: Medicare Other | Admitting: *Deleted

## 2011-05-21 DIAGNOSIS — I4891 Unspecified atrial fibrillation: Secondary | ICD-10-CM

## 2011-05-21 LAB — PROTIME-INR
INR: 10 ratio (ref 0.8–1.0)
Prothrombin Time: 87.9 s (ref 10.2–12.4)

## 2011-05-24 ENCOUNTER — Ambulatory Visit (INDEPENDENT_AMBULATORY_CARE_PROVIDER_SITE_OTHER): Payer: Medicare Other | Admitting: *Deleted

## 2011-05-24 DIAGNOSIS — I4891 Unspecified atrial fibrillation: Secondary | ICD-10-CM

## 2011-05-24 LAB — POCT INR: INR: 2.9

## 2011-05-31 ENCOUNTER — Ambulatory Visit (INDEPENDENT_AMBULATORY_CARE_PROVIDER_SITE_OTHER): Payer: Medicare Other | Admitting: *Deleted

## 2011-05-31 DIAGNOSIS — I4891 Unspecified atrial fibrillation: Secondary | ICD-10-CM

## 2011-06-09 ENCOUNTER — Other Ambulatory Visit: Payer: Medicare Other | Admitting: *Deleted

## 2011-06-22 ENCOUNTER — Ambulatory Visit (INDEPENDENT_AMBULATORY_CARE_PROVIDER_SITE_OTHER): Payer: Medicare Other | Admitting: *Deleted

## 2011-06-22 ENCOUNTER — Other Ambulatory Visit: Payer: Self-pay | Admitting: Cardiology

## 2011-06-22 DIAGNOSIS — I4891 Unspecified atrial fibrillation: Secondary | ICD-10-CM

## 2011-06-22 MED ORDER — WARFARIN SODIUM 5 MG PO TABS: ORAL_TABLET | ORAL | Status: DC

## 2011-06-22 NOTE — Telephone Encounter (Signed)
Please call RX in.  

## 2011-07-01 ENCOUNTER — Encounter: Payer: Self-pay | Admitting: Family Medicine

## 2011-07-01 ENCOUNTER — Ambulatory Visit (INDEPENDENT_AMBULATORY_CARE_PROVIDER_SITE_OTHER): Payer: Medicare Other | Admitting: Family Medicine

## 2011-07-01 DIAGNOSIS — R609 Edema, unspecified: Secondary | ICD-10-CM

## 2011-07-01 NOTE — Patient Instructions (Addendum)
Keep left leg  above the level of the heart as much as possible for the next week. Avoid salt as much as possible. Increase Lasix (Furosemide) to 40mg  AM and Noon Fri thru Sun. Have fasting Bmet drawn Mon.  RTC Wed for recheck.  Eat a banana a day until seen. If swelling continues and gets SOB, go to ER.

## 2011-07-01 NOTE — Assessment & Plan Note (Signed)
Acutely worse LLE edema that the pt has had in the past but worse. Has long h/o diuretic use. Is therapeutic on Coumadin for Afib as of earlier this month. Should have minimal risk of DVT.  Will try treating as acute edema and use increased diuretic and elevation. Impressed on him need for elevation above the level of the heart. To ER if develops SOB or pain in the leg.

## 2011-07-01 NOTE — Progress Notes (Signed)
  Subjective:    Patient ID: Tony Reed, male    DOB: 08-02-1926, 77 y.o.   MRN: [id number]  HPI The pt is here as acute appt for swelling of the left leg. It has been going on for a few weeks, maybe three or four, with discomfort from the swelling, principally in the ankle area. He is on coumadin, therapeutic when last checked in the beginning of the month. He denies breathing difficulties or fatigue. He sits around a lot but does get out and walk. He has continued to be active.  He admits to eating salt. He is on Lasix and has been for a while.     Review of SystemsNoncontributory except as above.       Objective:   Physical Exam  Constitutional: He appears well-developed and well-nourished. No distress.  HENT:  Head: Normocephalic and atraumatic.  Right Ear: External ear normal.  Left Ear: External ear normal.  Nose: Nose normal.  Mouth/Throat: Oropharynx is clear and moist.  Eyes: Conjunctivae and EOM are normal. Pupils are equal, round, and reactive to light. Right eye exhibits no discharge. Left eye exhibits no discharge.  Neck: Normal range of motion. Neck supple.  Cardiovascular: Normal rate and regular rhythm.   Pulmonary/Chest: Effort normal and breath sounds normal. He has no wheezes.  Lymphadenopathy:    He has no cervical adenopathy.  Skin: Skin is warm and dry. Rash noted. He is not diaphoretic. There is erythema.       Left leg swollen with pitting edema 3+ the knee, 2+ to midthigh and 1+ to upper thigh. Also stasis erythema and slight brawny appearance of the skin on the distal lower left leg and ankle. Homan's negative.          Assessment & Plan:

## 2011-07-05 ENCOUNTER — Other Ambulatory Visit (INDEPENDENT_AMBULATORY_CARE_PROVIDER_SITE_OTHER): Payer: Medicare Other

## 2011-07-05 DIAGNOSIS — I2510 Atherosclerotic heart disease of native coronary artery without angina pectoris: Secondary | ICD-10-CM

## 2011-07-05 DIAGNOSIS — E785 Hyperlipidemia, unspecified: Secondary | ICD-10-CM

## 2011-07-05 LAB — LIPID PANEL
LDL Cholesterol: 65 mg/dL (ref 0–99)
Total CHOL/HDL Ratio: 3

## 2011-07-05 LAB — HEPATIC FUNCTION PANEL
Alkaline Phosphatase: 59 U/L (ref 39–117)
Bilirubin, Direct: 0 mg/dL (ref 0.0–0.3)
Total Bilirubin: 0.6 mg/dL (ref 0.3–1.2)

## 2011-07-06 ENCOUNTER — Ambulatory Visit (INDEPENDENT_AMBULATORY_CARE_PROVIDER_SITE_OTHER): Payer: Medicare Other | Admitting: *Deleted

## 2011-07-06 DIAGNOSIS — I4891 Unspecified atrial fibrillation: Secondary | ICD-10-CM

## 2011-07-07 ENCOUNTER — Encounter: Payer: Self-pay | Admitting: Family Medicine

## 2011-07-07 ENCOUNTER — Ambulatory Visit (INDEPENDENT_AMBULATORY_CARE_PROVIDER_SITE_OTHER): Payer: Medicare Other | Admitting: Family Medicine

## 2011-07-07 DIAGNOSIS — I10 Essential (primary) hypertension: Secondary | ICD-10-CM

## 2011-07-07 DIAGNOSIS — R609 Edema, unspecified: Secondary | ICD-10-CM

## 2011-07-07 DIAGNOSIS — I4891 Unspecified atrial fibrillation: Secondary | ICD-10-CM

## 2011-07-07 NOTE — Patient Instructions (Signed)
Increase Lasix to 60mg  (11/2 of 40mg  tab) daily. Get Bmet in one week. 782.3 Wear support hose daily.  RTC one month for recheck.

## 2011-07-07 NOTE — Assessment & Plan Note (Addendum)
Improved but still with assymetric swelling, L>R. Still no pain and no SOB. Coumadin UTD and therapeutic. Will increase Lasix to 60mg  daily. Avoid salt. Check Bmet. See back.

## 2011-07-07 NOTE — Assessment & Plan Note (Signed)
Good control. Cont curr meds. BP Readings from Last 3 Encounters:  07/07/11 122/50  07/01/11 100/60  03/30/11 94/55

## 2011-07-07 NOTE — Assessment & Plan Note (Signed)
Well controlled with good rate.

## 2011-07-07 NOTE — Progress Notes (Signed)
  Subjective:    Patient ID: Tony Reed, male    DOB: April 23, 1926, 77 y.o.   MRN: [id number]  HPI Pty here for followup. HE was seen last week for edema worse over the last few weeks but left lweg markedly worse than the right. We discussed aggressive elevation and increase of Lasix to 40mg  bid Wed thru Sun and then back to his usual 40 AM. Bmet that was supposed to be done Mon was not done. He did have Hepatic and Lipid then however and they were ok.  He feels well today and thinks the leg is somewhat better. It is however still swollen.    Review of Systems  Constitutional: Negative for fever, chills, diaphoresis, activity change, appetite change and fatigue.  HENT: Negative for hearing loss, ear pain, congestion, sore throat, rhinorrhea, neck pain, neck stiffness, postnasal drip, sinus pressure, tinnitus and ear discharge.   Eyes: Negative for pain, discharge and visual disturbance.  Respiratory: Negative for cough, shortness of breath and wheezing.   Cardiovascular: Negative for chest pain and palpitations.       No SOB w/ exertion  Gastrointestinal:       No heartburn or swallowing problems.  Genitourinary:       No nocturia  Skin:       No itching or dryness.  Neurological:       No tingling or balance problems.  All other systems reviewed and are negative.       Objective:   Physical Exam  Constitutional: He appears well-developed and well-nourished. No distress.  HENT:  Head: Normocephalic and atraumatic.  Right Ear: External ear normal.  Left Ear: External ear normal.  Nose: Nose normal.  Mouth/Throat: Oropharynx is clear and moist.  Eyes: Conjunctivae and EOM are normal. Pupils are equal, round, and reactive to light. Right eye exhibits no discharge. Left eye exhibits no discharge.  Neck: Normal range of motion. Neck supple.  Cardiovascular: Normal rate and regular rhythm.   Pulmonary/Chest: Effort normal and breath sounds normal. He has no wheezes.    Musculoskeletal: He exhibits edema (left > right but improved. Still no leg pain.).  Lymphadenopathy:    He has no cervical adenopathy.  Skin: He is not diaphoretic.          Assessment & Plan:

## 2011-07-12 ENCOUNTER — Other Ambulatory Visit: Payer: Self-pay | Admitting: Family Medicine

## 2011-07-12 DIAGNOSIS — R609 Edema, unspecified: Secondary | ICD-10-CM

## 2011-07-14 ENCOUNTER — Other Ambulatory Visit (INDEPENDENT_AMBULATORY_CARE_PROVIDER_SITE_OTHER): Payer: Medicare Other

## 2011-07-14 DIAGNOSIS — R609 Edema, unspecified: Secondary | ICD-10-CM

## 2011-07-14 LAB — BASIC METABOLIC PANEL
BUN: 31 mg/dL — ABNORMAL HIGH (ref 6–23)
CO2: 28 mEq/L (ref 19–32)
Chloride: 101 mEq/L (ref 96–112)
Glucose, Bld: 95 mg/dL (ref 70–99)
Potassium: 4 mEq/L (ref 3.5–5.1)

## 2011-07-27 ENCOUNTER — Ambulatory Visit (INDEPENDENT_AMBULATORY_CARE_PROVIDER_SITE_OTHER): Payer: Medicare Other | Admitting: *Deleted

## 2011-07-27 DIAGNOSIS — I4891 Unspecified atrial fibrillation: Secondary | ICD-10-CM

## 2011-07-27 LAB — POCT INR: INR: 3.2

## 2011-08-03 ENCOUNTER — Ambulatory Visit: Payer: Medicare Other | Admitting: Family Medicine

## 2011-08-11 ENCOUNTER — Encounter: Payer: Self-pay | Admitting: Family Medicine

## 2011-08-11 ENCOUNTER — Ambulatory Visit (INDEPENDENT_AMBULATORY_CARE_PROVIDER_SITE_OTHER): Payer: Medicare Other | Admitting: Family Medicine

## 2011-08-11 DIAGNOSIS — I4891 Unspecified atrial fibrillation: Secondary | ICD-10-CM

## 2011-08-11 DIAGNOSIS — I5033 Acute on chronic diastolic (congestive) heart failure: Secondary | ICD-10-CM

## 2011-08-11 DIAGNOSIS — R413 Other amnesia: Secondary | ICD-10-CM

## 2011-08-11 DIAGNOSIS — R609 Edema, unspecified: Secondary | ICD-10-CM

## 2011-08-11 DIAGNOSIS — E785 Hyperlipidemia, unspecified: Secondary | ICD-10-CM

## 2011-08-11 DIAGNOSIS — I10 Essential (primary) hypertension: Secondary | ICD-10-CM

## 2011-08-11 DIAGNOSIS — Z87898 Personal history of other specified conditions: Secondary | ICD-10-CM

## 2011-08-11 NOTE — Assessment & Plan Note (Signed)
He doesn't think this anything unusual for age but his wife called about this. Sent message home to get appt if they want this formally checked. Check on price of poss meds as well. He seems to accomplish ADLs ok from questioning him.

## 2011-08-11 NOTE — Assessment & Plan Note (Signed)
Improved. Cont curr meds. 

## 2011-08-11 NOTE — Assessment & Plan Note (Signed)
Well controlled rate today. Seems compensated.

## 2011-08-11 NOTE — Assessment & Plan Note (Signed)
Urination decently controlled. Discussed fluid intake after supper.

## 2011-08-11 NOTE — Assessment & Plan Note (Signed)
Edema better today. Cont Lasix daily.

## 2011-08-11 NOTE — Patient Instructions (Addendum)
If wife wants memory checked, have pt make 1/2 hour appt for checking. Would start medication if they so desire. Suggest checking cost of Aricept and Namenda. RTC 6 mos for recheck with Dr Para March, sooner as needed.

## 2011-08-11 NOTE — Assessment & Plan Note (Signed)
Great nos on curr meds as assessed a month or so ago.

## 2011-08-11 NOTE — Assessment & Plan Note (Signed)
Good control today. Cont curr meds. BP Readings from Last 3 Encounters:  08/11/11 104/58  07/07/11 122/50  07/01/11 100/60

## 2011-08-11 NOTE — Progress Notes (Signed)
Subjective:    Patient ID: DEO MEHRINGER, male    DOB: 1926-01-21, 77 y.o.   MRN: [id number]  HPI Pt here by himself today for Comp Exam. He has been seen the last few times for swelling of the legs, L>R. Last time seen he was to increase lasix for a few days and then go back to once a day. The people around him are aware of memory problems and he agrees that he is forgetting things more and more. His vision is ok to  Read and he can read ok.     Review of Systems  Constitutional: Negative for fever, chills, diaphoresis, appetite change, fatigue and unexpected weight change.  HENT: Positive for hearing loss (mild noticed today.). Negative for ear pain, tinnitus and ear discharge.   Eyes: Negative for pain, discharge and visual disturbance.  Respiratory: Positive for shortness of breath (occas). Negative for cough and wheezing.   Cardiovascular: Negative for chest pain and palpitations.       No SOB w/ exertion  Gastrointestinal: Negative for nausea, vomiting, abdominal pain, diarrhea, constipation and blood in stool.       No heartburn or swallowing problems.  Genitourinary: Negative for dysuria, frequency and difficulty urinating.       Occas nocturia  Musculoskeletal: Negative for myalgias, back pain and arthralgias.  Skin: Negative for rash.       No itching or dryness.  Neurological: Negative for tremors and numbness.       No tingling or balance problems.  Hematological: Negative for adenopathy. Does not bruise/bleed easily.  Psychiatric/Behavioral: Negative for dysphoric mood and agitation.       Objective:   Physical Exam  Constitutional: He is oriented to person, place, and time. He appears well-developed and well-nourished. No distress.  HENT:  Head: Normocephalic and atraumatic.  Right Ear: External ear normal.  Left Ear: External ear normal.  Nose: Nose normal.  Mouth/Throat: Oropharynx is clear and moist.       Cerumen occluding bilat, partially removed via  currette.  Eyes: Conjunctivae and EOM are normal. Pupils are equal, round, and reactive to light. Right eye exhibits no discharge. Left eye exhibits no discharge. No scleral icterus.  Neck: Normal range of motion. Neck supple. No thyromegaly present.  Cardiovascular: Normal rate, regular rhythm, normal heart sounds and intact distal pulses.   No murmur heard.      Chronic edema of LLE, improved over last visit significantly.  Pulmonary/Chest: Effort normal and breath sounds normal. No respiratory distress. He has no wheezes.  Abdominal: Soft. Bowel sounds are normal. He exhibits no distension and no mass. There is no tenderness. There is no rebound and no guarding.  Genitourinary: Penis normal.       Rectal not done.  Musculoskeletal: Normal range of motion. He exhibits no edema.  Lymphadenopathy:    He has no cervical adenopathy.  Neurological: He is alert and oriented to person, place, and time. Coordination normal.  Skin: Skin is warm and dry. No rash noted. He is not diaphoretic.       Multiple SKs on trunk.  Psychiatric: He has a normal mood and affect. His behavior is normal. Judgment and thought content normal.          Assessment & Plan:  HMPE   I have personally reviewed the Medicare Annual Wellness questionnaire and have noted 1. The patient's medical and social history 2. Their use of alcohol, tobacco or illicit drugs 3. Their current medications and supplements  4. The patient's functional ability including ADL's, fall risks, home safety risks and hearing or visual             impairment. 5. Diet and physical activities 6. Evidence for depression or mood disorders  He shows no signs of cognitive decline other than possible memory deficit. He has not had surgery, been in the hospital or seen another doctor this year.He does not drink alcohol or smoke. He walks regularly. He has no trouble with ADLs, has no trouble with falls and has not fallen in the last year. He feels  safe at home and is not depressed.

## 2011-08-16 ENCOUNTER — Other Ambulatory Visit: Payer: Self-pay | Admitting: Family Medicine

## 2011-08-17 ENCOUNTER — Ambulatory Visit (INDEPENDENT_AMBULATORY_CARE_PROVIDER_SITE_OTHER): Payer: Medicare Other | Admitting: *Deleted

## 2011-08-17 DIAGNOSIS — I4891 Unspecified atrial fibrillation: Secondary | ICD-10-CM

## 2011-08-26 ENCOUNTER — Other Ambulatory Visit: Payer: Self-pay | Admitting: *Deleted

## 2011-08-26 MED ORDER — DOXAZOSIN MESYLATE 8 MG PO TABS: ORAL_TABLET | ORAL | Status: DC

## 2011-08-31 ENCOUNTER — Ambulatory Visit (INDEPENDENT_AMBULATORY_CARE_PROVIDER_SITE_OTHER): Payer: Medicare Other | Admitting: *Deleted

## 2011-08-31 DIAGNOSIS — I4891 Unspecified atrial fibrillation: Secondary | ICD-10-CM

## 2011-09-21 ENCOUNTER — Ambulatory Visit (INDEPENDENT_AMBULATORY_CARE_PROVIDER_SITE_OTHER): Payer: Medicare Other | Admitting: *Deleted

## 2011-09-21 DIAGNOSIS — I4891 Unspecified atrial fibrillation: Secondary | ICD-10-CM

## 2011-09-27 ENCOUNTER — Other Ambulatory Visit: Payer: Self-pay | Admitting: Cardiology

## 2011-10-01 ENCOUNTER — Ambulatory Visit (INDEPENDENT_AMBULATORY_CARE_PROVIDER_SITE_OTHER): Payer: Medicare Other | Admitting: *Deleted

## 2011-10-01 DIAGNOSIS — I4891 Unspecified atrial fibrillation: Secondary | ICD-10-CM

## 2011-10-01 MED ORDER — PHYTONADIONE 5 MG PO TABS: 2.5000 mg | ORAL_TABLET | Freq: Once | ORAL | Status: DC

## 2011-10-01 NOTE — Patient Instructions (Signed)
Anticoagulant therapy safety review due to INR of 11.5

## 2011-10-05 ENCOUNTER — Encounter: Payer: Medicare Other | Admitting: *Deleted

## 2011-11-05 ENCOUNTER — Encounter: Payer: Self-pay | Admitting: Family Medicine

## 2011-11-05 ENCOUNTER — Ambulatory Visit (INDEPENDENT_AMBULATORY_CARE_PROVIDER_SITE_OTHER): Payer: Medicare Other | Admitting: Family Medicine

## 2011-11-05 VITALS — BP 110/50 | HR 53 | Temp 98.2°F | Wt 196.0 lb

## 2011-11-05 DIAGNOSIS — R413 Other amnesia: Secondary | ICD-10-CM

## 2011-11-05 MED ORDER — FUROSEMIDE 40 MG PO TABS: ORAL_TABLET | ORAL | Status: DC

## 2011-11-05 NOTE — Progress Notes (Addendum)
Lives with wife (who is on hospice for mult med problems) and their son.  Pt prev was doing a lot of errands for her.  Last week- was at [facility], got lost, walked a few blocks, and then got lost.  Had to call for help.  Family had noted memory changes, progressively over last few months.  No sudden change in sx, ie no focal neuro sx.  Had d/w prev PMD about memory, but it appears that it is progressive.  Still recognized family but getting lost is new, and this was in a well known area of town.  Here today with family.   PMH and SH reviewed  Meds, vitals, and allergies reviewed.   ROS: See HPI.  Otherwise negative.    GEN: nad, alert HEENT: mucous membranes moist NECK: supple w/o LA CV: IRR but not tachy PULM: ctab, no inc wob ABD: soft, +bs EXT: no edema SKIN: no acute rash MMSE 20/30, -2 for orientation, -3 for calculation, -3 for recall, -1 for writing and -1 for copying figure

## 2011-11-05 NOTE — Patient Instructions (Signed)
Don't drive. You can get your results through our phone system.  Follow the instructions on the blue card. I'll send word about possible medicines for memory after I see the labs.  Glad to see you.  I want to see you back in about 4-6 weeks to recheck your memory.  visit.

## 2011-11-06 LAB — CBC WITH DIFFERENTIAL/PLATELET
Basophils Absolute: 0 10*3/uL (ref 0.0–0.1)
Basophils Relative: 0 % (ref 0–1)
HCT: 33.1 % — ABNORMAL LOW (ref 39.0–52.0)
MCHC: 32.9 g/dL (ref 30.0–36.0)
Monocytes Absolute: 0.5 10*3/uL (ref 0.1–1.0)
Neutro Abs: 3.2 10*3/uL (ref 1.7–7.7)
RDW: 12.8 % (ref 11.5–15.5)

## 2011-11-06 LAB — COMPREHENSIVE METABOLIC PANEL
ALT: 20 U/L (ref 0–53)
AST: 27 U/L (ref 0–37)
Albumin: 4.6 g/dL (ref 3.5–5.2)
Calcium: 10 mg/dL (ref 8.4–10.5)
Chloride: 98 mEq/L (ref 96–112)
Creat: 1.86 mg/dL — ABNORMAL HIGH (ref 0.50–1.35)
Potassium: 4.4 mEq/L (ref 3.5–5.3)
Sodium: 137 mEq/L (ref 135–145)

## 2011-11-06 LAB — VITAMIN B12: Vitamin B-12: 273 pg/mL (ref 211–911)

## 2011-11-06 LAB — FOLATE: Folate: >20 ng/mL

## 2011-11-07 ENCOUNTER — Encounter: Payer: Self-pay | Admitting: Family Medicine

## 2011-11-07 MED ORDER — MEMANTINE HCL 5 MG PO TABS: ORAL_TABLET | ORAL | Status: DC

## 2011-11-07 NOTE — Assessment & Plan Note (Addendum)
Progressive, see notes on labs.  Will start namenda and recheck MMSE at f/u.  Has family support at home and depending on his response to the meds, we may need to revisit this.  >25 min spent with face to face with patient, >50% counseling and/or coordinating care. Family/pt agree.  No driving.    MMSE 20/30, -2 for orientation, -3 for calculation, -3 for recall, -1 for writing and -1 for copying figure

## 2011-12-22 ENCOUNTER — Ambulatory Visit (INDEPENDENT_AMBULATORY_CARE_PROVIDER_SITE_OTHER): Payer: Medicare Other | Admitting: Family Medicine

## 2011-12-22 ENCOUNTER — Encounter: Payer: Self-pay | Admitting: Family Medicine

## 2011-12-22 DIAGNOSIS — R413 Other amnesia: Secondary | ICD-10-CM

## 2011-12-22 MED ORDER — MEMANTINE HCL 10 MG PO TABS: 10.0000 mg | ORAL_TABLET | Freq: Two times a day (BID) | ORAL | Status: DC

## 2011-12-22 NOTE — Patient Instructions (Signed)
Increase to 10mg  twice a day on the namenda and call me with an update in about 6 weeks.  Take care.

## 2011-12-23 ENCOUNTER — Encounter: Payer: Self-pay | Admitting: Family Medicine

## 2011-12-23 NOTE — Progress Notes (Signed)
H/o memory loss, started on namenda and tolerated well.  No ADEs except for HA initially that has resolved.  Family hasn't noted sig change.  Prev labs unremarkable.  Still living at home with sig help/supervision from son.   Meds, vitals, and allergies reviewed.   ROS: See HPI.  Otherwise, noncontributory.  nad ncat Mmm rrr ctab Abd soft Alert MMSE 19/30, -3 orientation, -3 attention, -3 recall, -1 writing and -1 copying.

## 2011-12-23 NOTE — Assessment & Plan Note (Signed)
MMSE about the same.  Will inc to 10mg  bid and family will call back with update in about 6 weeks.  D/w pt's daughter about safety in the home.  Okay for outpatient f/u.

## 2011-12-31 ENCOUNTER — Other Ambulatory Visit: Payer: Self-pay | Admitting: Cardiology

## 2011-12-31 ENCOUNTER — Other Ambulatory Visit: Payer: Self-pay

## 2011-12-31 DIAGNOSIS — E785 Hyperlipidemia, unspecified: Secondary | ICD-10-CM

## 2011-12-31 DIAGNOSIS — I2510 Atherosclerotic heart disease of native coronary artery without angina pectoris: Secondary | ICD-10-CM

## 2011-12-31 MED ORDER — POTASSIUM CHLORIDE CRYS ER 20 MEQ PO TBCR: 20.0000 meq | EXTENDED_RELEASE_TABLET | Freq: Two times a day (BID) | ORAL | Status: DC

## 2012-01-28 ENCOUNTER — Other Ambulatory Visit: Payer: Self-pay | Admitting: Cardiology

## 2012-01-28 MED ORDER — RAMIPRIL 10 MG PO CAPS: 10.0000 mg | ORAL_CAPSULE | Freq: Two times a day (BID) | ORAL | Status: DC

## 2012-02-11 ENCOUNTER — Encounter: Payer: Self-pay | Admitting: Family Medicine

## 2012-02-11 ENCOUNTER — Ambulatory Visit (INDEPENDENT_AMBULATORY_CARE_PROVIDER_SITE_OTHER): Payer: Medicare Other | Admitting: Family Medicine

## 2012-02-11 VITALS — BP 108/60 | HR 65 | Temp 97.9°F | Wt 195.0 lb

## 2012-02-11 DIAGNOSIS — I4891 Unspecified atrial fibrillation: Secondary | ICD-10-CM

## 2012-02-11 DIAGNOSIS — R413 Other amnesia: Secondary | ICD-10-CM

## 2012-02-11 DIAGNOSIS — I10 Essential (primary) hypertension: Secondary | ICD-10-CM

## 2012-02-11 DIAGNOSIS — M810 Age-related osteoporosis without current pathological fracture: Secondary | ICD-10-CM | POA: Insufficient documentation

## 2012-02-11 LAB — BASIC METABOLIC PANEL
BUN: 25 mg/dL — ABNORMAL HIGH (ref 6–23)
Calcium: 9 mg/dL (ref 8.4–10.5)
GFR: 42.82 mL/min — ABNORMAL LOW (ref >60.00)
Glucose, Bld: 98 mg/dL (ref 70–99)

## 2012-02-11 NOTE — Assessment & Plan Note (Signed)
Likely has gotten benefit of bisphosphonate, would not initiate other tx at this point.

## 2012-02-11 NOTE — Assessment & Plan Note (Signed)
Will recheck later in 2013.  Continue as is for now.

## 2012-02-11 NOTE — Assessment & Plan Note (Signed)
BP okay, continue current meds along with warfarin.

## 2012-02-11 NOTE — Progress Notes (Signed)
H/o osteoporosis and s/p fosamax for 5+ years.  We discussed stopping treatment given the duration and this is reasonable.  Will not need f/u DXA at this point.   H/o AF, not having to use NTG and feeling well w/o CP.  Lipids controlled on check in 9/12.    Hypertension:    Using medication without problems: yes Chest pain with exertion:no Edema:no Short of breath:no  He didn't increase his namenda until recently so we didn't check MMSE today but he may have some improvement since starting the meds per family.  He should not drive.  We discussed.  Not oriented to year.    Meds, vitals, and allergies reviewed.   PMH and SH reviewed  ROS: See HPI.  Otherwise negative.    GEN: nad, alert and oriented HEENT: mucous membranes moist NECK: supple w/o LA CV: rrr today, no ectopy noted PULM: ctab, no inc wob ABD: soft, +bs EXT: L>R edema on baseline SKIN: no acute rash

## 2012-02-11 NOTE — Assessment & Plan Note (Signed)
Continue as is, recheck BMET today.

## 2012-02-11 NOTE — Patient Instructions (Addendum)
Finish the remaining fosamax and then stop the medicine.  Call about seeing the cardiology clinic.   Don't drive.  We'll contact you with your lab report. Recheck memory at visit in about 2 months.

## 2012-02-14 ENCOUNTER — Encounter: Payer: Self-pay | Admitting: *Deleted

## 2012-02-23 ENCOUNTER — Telehealth: Payer: Self-pay | Admitting: *Deleted

## 2012-02-23 NOTE — Telephone Encounter (Signed)
Spoke with wife over phone, made appt for Friday for coumadin clinic INR ck, also has appt with Dr Shirlee Latch that day, needs refill for coum

## 2012-02-25 ENCOUNTER — Other Ambulatory Visit: Payer: Self-pay | Admitting: Cardiology

## 2012-02-25 ENCOUNTER — Ambulatory Visit (INDEPENDENT_AMBULATORY_CARE_PROVIDER_SITE_OTHER): Payer: Medicare Other | Admitting: Cardiology

## 2012-02-25 ENCOUNTER — Encounter: Payer: Self-pay | Admitting: Cardiology

## 2012-02-25 ENCOUNTER — Ambulatory Visit (INDEPENDENT_AMBULATORY_CARE_PROVIDER_SITE_OTHER): Payer: Medicare Other | Admitting: *Deleted

## 2012-02-25 DIAGNOSIS — R079 Chest pain, unspecified: Secondary | ICD-10-CM

## 2012-02-25 DIAGNOSIS — I4891 Unspecified atrial fibrillation: Secondary | ICD-10-CM

## 2012-02-25 DIAGNOSIS — I2510 Atherosclerotic heart disease of native coronary artery without angina pectoris: Secondary | ICD-10-CM

## 2012-02-25 DIAGNOSIS — I5032 Chronic diastolic (congestive) heart failure: Secondary | ICD-10-CM

## 2012-02-25 DIAGNOSIS — I509 Heart failure, unspecified: Secondary | ICD-10-CM

## 2012-02-25 LAB — POCT INR: INR: 1.5

## 2012-02-25 MED ORDER — WARFARIN SODIUM 5 MG PO TABS: ORAL_TABLET | ORAL | Status: DC

## 2012-02-25 NOTE — Patient Instructions (Signed)
Stop aspirin.  Your physician has requested that you have en exercise stress myoview. For further information please visit [email or web address] Please follow instruction sheet, as given.  Your physician recommends that you return for a FASTING lipid profile /liver profile/BMET -you can have this done when you return for the myoview.  Your physician wants you to follow-up in: 6 months with Dr Shirlee Latch. (November 2013). You will receive a reminder letter in the mail two months in advance. If you don't receive a letter, please call our office to schedule the follow-up appointment.

## 2012-02-27 NOTE — Assessment & Plan Note (Signed)
H/o PAF.  NSR today.  Continue warfarin.  No rate control medications given baseline low heart rate.

## 2012-02-27 NOTE — Assessment & Plan Note (Signed)
Significant chest pain requiring NTG use earlier this week when he was distressed about getting lost.  Given his known CAD, I will arrange an ETT-myoview. If myoview is normal, he can stop ASA as he is also on warfarin.  Continue statin and ACEI.

## 2012-02-27 NOTE — Assessment & Plan Note (Signed)
Stable edema, no JVD.  No significant dyspnea.  Continue current Lasix dosing but will get BMET to make sure that creatinine is not rising.

## 2012-02-27 NOTE — Progress Notes (Signed)
PCP: Dr. Para March  78 yo with history of CAD, diastolic CHF, and paroxysmal atrial fibrillation returns for cardiology followup.  Low resting heart rate but no history of lightheadedness or syncope.  Tony Reed has chronic L>R leg edema with no change.  Exercise capacity continues to be good, Tony Reed says Tony Reed can walk up to a mile without exertional dyspnea.  Tony Reed has had some memory trouble and is taking Namenda.  On Tuesday night, Tony Reed got lost while driving to [place] and developed significant central chest tightness.  This resolved with 2 NTGs.  No other recent chest pain.  Weight is down 2 lbs since prior appointment.   Labs (10/11): K 3.9, creatinine 1.5, LDL 79, HDL 53 Labs (2/12): K 3.9, creatinine 1.3, BNP 267 Labs (9/12): LDL 65, HDL 50 Labs (4/13): K 3.7, creatinine 1.6  ECG: Sinus bradycardia at 49, otherwise normal  Allergies (verified):  1)  ! Pcn 2)  Naprosyn (Naproxen)  Past Medical History: 1. Hypertension 2. CAD: Inferior MI in 2003 with BMS to RCA.  DES to CFX in 2008.  3. Osteoporosis 4. Diverticulosis, colon 5. GERD 6. Hyperlipidemia. 7. Leg pain and decreased pulses consistent with peripheral vascular disease. 8. Paroxysmal atrial fibrillation with history of DCCV.  9. Diastolic CHF: Echo (4/11) with EF 55-60%, moderate MR, moderate to severe LAE, severe RAE, moderate to severe TR. L>R lower leg edema chronically.  10. Dementia: Mild 11. CKD  Family History: Father: Died unknown age Mother: Died at age 18 leukemia and HTN Siblings: One brother died at age 76 from  smoking and lung cancer. Half brother died at age 61 heart problems, smoker.  Half brother A  57 Smoker  Deaf Half Sister A 4  Social History: Marital Status: Married, lives with wife in [place].  Children: 6 Occupation: Retired Clinical research associate.   Current Outpatient Prescriptions  Medication Sig Dispense Refill  . atorvastatin (LIPITOR) 80 MG tablet TAKE ONE TABLET BY MOUTH ONE TIME DAILY  30 tablet  5  .  doxazosin (CARDURA) 8 MG tablet Take 1/2 tab qd  30 tablet  6  . ferrous sulfate 325 (65 FE) MG tablet Take 325 mg by mouth daily with breakfast.        . furosemide (LASIX) 40 MG tablet Take 2 tablets in the morning and 1 tablet in the evening  90 tablet  6  . memantine (NAMENDA) 10 MG tablet Take 1 tablet (10 mg total) by mouth 2 (two) times daily.  60 tablet  5  . nitroGLYCERIN (NITROSTAT) 0.4 MG SL tablet Place 0.4 mg under the tongue as directed. As needed        . potassium chloride SA (K-DUR,KLOR-CON) 20 MEQ tablet Take 1 tablet (20 mEq total) by mouth 2 (two) times daily.  60 tablet  1  . ramipril (ALTACE) 10 MG capsule Take 1 capsule (10 mg total) by mouth 2 (two) times daily.  60 capsule  6  . warfarin (COUMADIN) 5 MG tablet Take as directed by Anticoagulation clinic  60 tablet  0    BP 126/68  Pulse 49  Ht 5\' 11"  (1.803 m)  Wt 196 lb (88.905 kg)  BMI 27.34 kg/m2 General:  Elderly man in no distress.  Neck:  Neck supple, no JVD. No masses, thyromegaly or abnormal cervical nodes. Lungs:  Clear bilaterally to auscultation and percussion. Heart:  Non-displaced PMI, chest non-tender; regular rate and rhythm, S1, S2. +S4. 2/6 systolic murmur LLSB and apex.  Carotid upstroke normal, no bruit.  1+ edema 3/4 up on left, 1+ edema at ankle on right.  Abdomen:  Bowel sounds positive; abdomen soft and non-tender without masses, organomegaly, or hernias noted. No hepatosplenomegaly. Extremities:  No clubbing or cyanosis. Neurologic:  Alert and oriented x 3.  Some memory difficulty.  Psych:  Normal affect.

## 2012-03-02 ENCOUNTER — Ambulatory Visit (HOSPITAL_COMMUNITY): Payer: Medicare Other | Attending: Cardiology | Admitting: Radiology

## 2012-03-02 ENCOUNTER — Other Ambulatory Visit (INDEPENDENT_AMBULATORY_CARE_PROVIDER_SITE_OTHER): Payer: Medicare Other

## 2012-03-02 ENCOUNTER — Ambulatory Visit (INDEPENDENT_AMBULATORY_CARE_PROVIDER_SITE_OTHER): Payer: Medicare Other

## 2012-03-02 DIAGNOSIS — R0789 Other chest pain: Secondary | ICD-10-CM | POA: Insufficient documentation

## 2012-03-02 DIAGNOSIS — I4891 Unspecified atrial fibrillation: Secondary | ICD-10-CM

## 2012-03-02 DIAGNOSIS — R079 Chest pain, unspecified: Secondary | ICD-10-CM

## 2012-03-02 DIAGNOSIS — E785 Hyperlipidemia, unspecified: Secondary | ICD-10-CM | POA: Insufficient documentation

## 2012-03-02 DIAGNOSIS — I10 Essential (primary) hypertension: Secondary | ICD-10-CM | POA: Insufficient documentation

## 2012-03-02 DIAGNOSIS — Z8249 Family history of ischemic heart disease and other diseases of the circulatory system: Secondary | ICD-10-CM | POA: Insufficient documentation

## 2012-03-02 DIAGNOSIS — I2510 Atherosclerotic heart disease of native coronary artery without angina pectoris: Secondary | ICD-10-CM

## 2012-03-02 DIAGNOSIS — I739 Peripheral vascular disease, unspecified: Secondary | ICD-10-CM | POA: Insufficient documentation

## 2012-03-02 LAB — BASIC METABOLIC PANEL
CO2: 26 mEq/L (ref 19–32)
Calcium: 9 mg/dL (ref 8.4–10.5)
Creatinine, Ser: 1.5 mg/dL (ref 0.4–1.5)
Glucose, Bld: 92 mg/dL (ref 70–99)
Sodium: 140 mEq/L (ref 135–145)

## 2012-03-02 LAB — HEPATIC FUNCTION PANEL
Albumin: 3.9 g/dL (ref 3.5–5.2)
Alkaline Phosphatase: 40 U/L (ref 39–117)
Total Protein: 6.5 g/dL (ref 6.0–8.3)

## 2012-03-02 LAB — LIPID PANEL: HDL: 47.2 mg/dL (ref >39.00)

## 2012-03-02 MED ORDER — TECHNETIUM TC 99M TETROFOSMIN IV KIT
10.0000 | PACK | Freq: Once | INTRAVENOUS | Status: AC | PRN
  Administered 2012-03-02: 10 via INTRAVENOUS

## 2012-03-02 MED ORDER — REGADENOSON 0.4 MG/5ML IV SOLN
0.4000 mg | Freq: Once | INTRAVENOUS | Status: AC
  Administered 2012-03-02: 0.4 mg via INTRAVENOUS

## 2012-03-02 MED ORDER — TECHNETIUM TC 99M TETROFOSMIN IV KIT
30.0000 | PACK | Freq: Once | INTRAVENOUS | Status: AC | PRN
  Administered 2012-03-02: 30 via INTRAVENOUS

## 2012-03-02 NOTE — Progress Notes (Addendum)
[facility] SITE 3 NUCLEAR MED [address] [place] [address] [phone]  Cardiology Nuclear Med Study  Tony Reed is a 78 y.o. male     MRN : [id number]     DOB: [birth year]  Procedure Date: 03/02/2012  Nuclear Med Background Indication for Stress Test:  Evaluation for Ischemia and Stent Patency History:  PAF, 2003 IWMI- Stents: RCAx2 and CFX, 05/2009 Heart Cath: N/O Dz Rx mgmt patent stents NL EF, 06/2009: MPS: Inf ischemia with mild peri infarct ischemia EF: 56% 4/11ECHO: EF: 55-60% Cardioversion, CHF: Diastolic, Cardiac Risk Factors: Family History - CAD, Hypertension, Lipids and PVD  Symptoms:  Chest Tightness   Nuclear Pre-Procedure Caffeine/Decaff Intake:  None NPO After: 8:00pm   Lungs:  clear O2 Sat: 96% on room air. IV 0.9% NS with Angio Cath:  20g  IV Site: R Antecubital  IV Started by:  Tony Reed, EMT-P  Chest Size (in):  42 Cup Size: n/a  Height: 5\' 11"  (1.803 m)  Weight:  190 lb (86.183 kg)  BMI:  Body mass index is 26.50 kg/(m^2). Tech Comments:  This patient was switched from a walker to a walking Lexiscan due to his unsteady gait.    Nuclear Med Study 1 or 2 day study: 1 day  Stress Test Type:  Treadmill/Lexiscan  Reading MD: Tony Ancona, MD  Order Authorizing Provider:  D.Jowanda Heeg MD  Resting Radionuclide: Technetium 19m Tetrofosmin  Resting Radionuclide Dose: 11.0 mCi   Stress Radionuclide:  Technetium 34m Tetrofosmin  Stress Radionuclide Dose: 33.0 mCi           Stress Protocol Rest HR: 44 Stress HR: 100  Rest BP: 130/81 Stress BP: 188/77  Exercise Time (min): n/a METS: n/a   Predicted Max HR: 137 bpm % Max HR: 72.99 bpm Rate Pressure Product: 46962   Dose of Adenosine (mg):  n/a Dose of Lexiscan: 0.4 mg  Dose of Atropine (mg): n/a Dose of Dobutamine: n/a mcg/kg/min (at max HR)  Stress Test Technologist: Tony Reed, EMT-P  Nuclear Technologist:  Tony Reed, CNMT     Rest Procedure:  Myocardial perfusion  imaging was performed at rest 45 minutes following the intravenous administration of Technetium 35m Tetrofosmin. Rest ECG: Sinus Bradycardia  Stress Procedure:  The patient received IV Lexiscan 0.4 mg over 15-seconds.  Technetium 36m Tetrofosmin injected at 30-seconds.  There were no significant changes, + sob, fatigue, abdominal, and rare pacs/pvc with Lexiscan.  Quantitative spect images were obtained after a 45 minute delay. Stress ECG: No significant change from baseline ECG  QPS Raw Data Images:  Normal; no motion artifact; normal heart/lung ratio. Stress Images:  Normal homogeneous uptake in all areas of the myocardium. Rest Images:  Normal homogeneous uptake in all areas of the myocardium. Subtraction (SDS):  There is no evidence of scar or ischemia. Transient Ischemic Dilatation (Normal <1.22):  0.94 Lung/Heart Ratio (Normal <0.45):  0.34  Quantitative Gated Spect Images QGS EDV:  105 ml QGS ESV:  41 ml  Impression Exercise Capacity:  Lexiscan with no exercise. BP Response:  Normal blood pressure response. Clinical Symptoms:  Fatigue, dyspnea, abdominal pain.  ECG Impression:  No significant ST segment change suggestive of ischemia. Comparison with Prior Nuclear Study: No images to compare  Overall Impression:  Normal stress nuclear study.  LV Ejection Fraction: 61%.  LV Wall Motion:  NL LV Function; NL Wall Motion  Tony Reed  Normal study, please inform patient.   Tony Reed 03/03/2012 12:44 PM

## 2012-03-03 ENCOUNTER — Telehealth: Payer: Self-pay | Admitting: *Deleted

## 2012-03-03 NOTE — Progress Notes (Signed)
No answer at number listed. Unable to leave msg. Mylo Red RN

## 2012-03-03 NOTE — Telephone Encounter (Signed)
Unable to leave message re: stress test results were normal.No answering at home. No voicemail. Mylo Red RN

## 2012-03-09 ENCOUNTER — Observation Stay (HOSPITAL_COMMUNITY)
Admission: EM | Admit: 2012-03-09 | Discharge: 2012-03-15 | Disposition: A | Discharge status: Home / Self Care | Payer: Medicare Other | Attending: Cardiology | Admitting: Cardiology

## 2012-03-09 ENCOUNTER — Other Ambulatory Visit: Payer: Self-pay

## 2012-03-09 ENCOUNTER — Emergency Department (HOSPITAL_COMMUNITY): Payer: Medicare Other

## 2012-03-09 ENCOUNTER — Encounter (HOSPITAL_COMMUNITY): Payer: Self-pay | Admitting: Emergency Medicine

## 2012-03-09 DIAGNOSIS — I10 Essential (primary) hypertension: Secondary | ICD-10-CM | POA: Insufficient documentation

## 2012-03-09 DIAGNOSIS — Z79899 Other long term (current) drug therapy: Secondary | ICD-10-CM | POA: Insufficient documentation

## 2012-03-09 DIAGNOSIS — I5032 Chronic diastolic (congestive) heart failure: Secondary | ICD-10-CM

## 2012-03-09 DIAGNOSIS — I129 Hypertensive chronic kidney disease with stage 1 through stage 4 chronic kidney disease, or unspecified chronic kidney disease: Secondary | ICD-10-CM | POA: Insufficient documentation

## 2012-03-09 DIAGNOSIS — N182 Chronic kidney disease, stage 2 (mild): Secondary | ICD-10-CM | POA: Insufficient documentation

## 2012-03-09 DIAGNOSIS — R609 Edema, unspecified: Secondary | ICD-10-CM

## 2012-03-09 DIAGNOSIS — I4891 Unspecified atrial fibrillation: Secondary | ICD-10-CM

## 2012-03-09 DIAGNOSIS — I079 Rheumatic tricuspid valve disease, unspecified: Secondary | ICD-10-CM | POA: Insufficient documentation

## 2012-03-09 DIAGNOSIS — F0390 Unspecified dementia without behavioral disturbance: Secondary | ICD-10-CM | POA: Insufficient documentation

## 2012-03-09 DIAGNOSIS — E876 Hypokalemia: Secondary | ICD-10-CM | POA: Insufficient documentation

## 2012-03-09 DIAGNOSIS — R413 Other amnesia: Secondary | ICD-10-CM | POA: Insufficient documentation

## 2012-03-09 DIAGNOSIS — R55 Syncope and collapse: Secondary | ICD-10-CM | POA: Diagnosis present

## 2012-03-09 DIAGNOSIS — M810 Age-related osteoporosis without current pathological fracture: Secondary | ICD-10-CM | POA: Insufficient documentation

## 2012-03-09 DIAGNOSIS — D509 Iron deficiency anemia, unspecified: Secondary | ICD-10-CM

## 2012-03-09 DIAGNOSIS — T463X5A Adverse effect of coronary vasodilators, initial encounter: Secondary | ICD-10-CM | POA: Insufficient documentation

## 2012-03-09 DIAGNOSIS — I498 Other specified cardiac arrhythmias: Secondary | ICD-10-CM | POA: Insufficient documentation

## 2012-03-09 DIAGNOSIS — E785 Hyperlipidemia, unspecified: Secondary | ICD-10-CM

## 2012-03-09 DIAGNOSIS — I252 Old myocardial infarction: Secondary | ICD-10-CM | POA: Insufficient documentation

## 2012-03-09 DIAGNOSIS — I951 Orthostatic hypotension: Principal | ICD-10-CM

## 2012-03-09 DIAGNOSIS — K449 Diaphragmatic hernia without obstruction or gangrene: Secondary | ICD-10-CM | POA: Insufficient documentation

## 2012-03-09 DIAGNOSIS — I2510 Atherosclerotic heart disease of native coronary artery without angina pectoris: Secondary | ICD-10-CM

## 2012-03-09 DIAGNOSIS — Z7901 Long term (current) use of anticoagulants: Secondary | ICD-10-CM | POA: Insufficient documentation

## 2012-03-09 LAB — CBC
HCT: 32.4 % — ABNORMAL LOW (ref 39.0–52.0)
Platelets: 166 10*3/uL (ref 150–400)
RDW: 12.9 % (ref 11.5–15.5)
WBC: 4.5 10*3/uL (ref 4.0–10.5)

## 2012-03-09 LAB — DIFFERENTIAL
Basophils Absolute: 0 10*3/uL (ref 0.0–0.1)
Lymphocytes Relative: 39 % (ref 12–46)
Monocytes Absolute: 0.4 10*3/uL (ref 0.1–1.0)
Neutro Abs: 2.2 10*3/uL (ref 1.7–7.7)

## 2012-03-09 LAB — COMPREHENSIVE METABOLIC PANEL
ALT: 15 U/L (ref 0–53)
AST: 21 U/L (ref 0–37)
CO2: 26 mEq/L (ref 19–32)
Chloride: 99 mEq/L (ref 96–112)
GFR calc non Af Amer: 34 mL/min — ABNORMAL LOW (ref >90)
Sodium: 138 mEq/L (ref 135–145)
Total Bilirubin: 0.7 mg/dL (ref 0.3–1.2)

## 2012-03-09 LAB — CARDIAC PANEL(CRET KIN+CKTOT+MB+TROPI): Total CK: 188 U/L (ref 7–232)

## 2012-03-09 MED ORDER — WARFARIN SODIUM 5 MG PO TABS
5.0000 mg | ORAL_TABLET | ORAL | Status: DC
  Filled 2012-03-09: qty 1

## 2012-03-09 MED ORDER — ACETAMINOPHEN 325 MG PO TABS: 650.0000 mg | ORAL_TABLET | Freq: Four times a day (QID) | ORAL | Status: DC | PRN

## 2012-03-09 MED ORDER — SODIUM CHLORIDE 0.9 % IJ SOLN
3.0000 mL | Freq: Two times a day (BID) | INTRAMUSCULAR | Status: DC
  Administered 2012-03-09 – 2012-03-15 (×11): 3 mL via INTRAVENOUS

## 2012-03-09 MED ORDER — ATORVASTATIN CALCIUM 80 MG PO TABS
80.0000 mg | ORAL_TABLET | Freq: Every day | ORAL | Status: DC
  Administered 2012-03-09 – 2012-03-14 (×6): 80 mg via ORAL
  Filled 2012-03-09 (×7): qty 1

## 2012-03-09 MED ORDER — DOXAZOSIN MESYLATE 4 MG PO TABS
4.0000 mg | ORAL_TABLET | Freq: Every day | ORAL | Status: DC
  Administered 2012-03-09 – 2012-03-15 (×7): 4 mg via ORAL
  Filled 2012-03-09 (×7): qty 1

## 2012-03-09 MED ORDER — WARFARIN SODIUM 5 MG PO TABS: 5.0000 mg | ORAL_TABLET | ORAL | Status: DC

## 2012-03-09 MED ORDER — MEMANTINE HCL 10 MG PO TABS
10.0000 mg | ORAL_TABLET | Freq: Two times a day (BID) | ORAL | Status: DC
  Administered 2012-03-09 – 2012-03-15 (×12): 10 mg via ORAL
  Filled 2012-03-09 (×13): qty 1

## 2012-03-09 MED ORDER — WARFARIN - PHYSICIAN DOSING INPATIENT: Freq: Every day | Status: DC

## 2012-03-09 MED ORDER — FERROUS SULFATE 325 (65 FE) MG PO TABS
325.0000 mg | ORAL_TABLET | Freq: Every day | ORAL | Status: DC
  Administered 2012-03-10 – 2012-03-15 (×6): 325 mg via ORAL
  Filled 2012-03-09 (×7): qty 1

## 2012-03-09 MED ORDER — ACETAMINOPHEN 650 MG RE SUPP: 650.0000 mg | Freq: Four times a day (QID) | RECTAL | Status: DC | PRN

## 2012-03-09 MED ORDER — POTASSIUM CHLORIDE CRYS ER 20 MEQ PO TBCR
20.0000 meq | EXTENDED_RELEASE_TABLET | Freq: Two times a day (BID) | ORAL | Status: DC
  Administered 2012-03-09 – 2012-03-15 (×12): 20 meq via ORAL
  Filled 2012-03-09 (×13): qty 1

## 2012-03-09 MED ORDER — WARFARIN SODIUM 10 MG PO TABS
10.0000 mg | ORAL_TABLET | ORAL | Status: DC
  Administered 2012-03-10: 10 mg via ORAL
  Filled 2012-03-09: qty 1

## 2012-03-09 NOTE — ED Notes (Signed)
MD at bedside. 

## 2012-03-09 NOTE — Progress Notes (Signed)
  Pharmacy Note (Brief) - Warfarin vs INR  Warfarin dosing per MD for Afib. Baseline INR therapeutic. Per P&T criteria, daily INR x3 days has been ordered. Further INR orders and warfarin dose changes shall be deferred to the MD.  Please call if any questions. [phone]. Thanks!  Darrol Angel, PharmD Pager: [phone] 03/09/2012 5:49 PM

## 2012-03-09 NOTE — ED Notes (Signed)
Pt brought in by ICU rapid response, pt had complaints of chest pain while visting wife.  Pt took 1 nitroglycerin then became unresponsive. Eventually awaken on his own. Pt reports no chest pain at the moment, awake, alert, and oriented. NAD at the moment.

## 2012-03-09 NOTE — ED Notes (Signed)
ZOX:WRUE<AV> Expected date:<BR> Expected time:<BR> Means of arrival:Wheelchair [ICU]<BR> Comments:<BR> Chest pain, diaphoresis

## 2012-03-09 NOTE — ED Notes (Signed)
Cardiology at bedside speaking with pt and family. 

## 2012-03-09 NOTE — Progress Notes (Signed)
ANTICOAGULATION CONSULT NOTE - Initial Consult  Pharmacy Consult for Warfarin Indication: atrial fibrillation  Allergies  Allergen Reactions  . Naproxen     REACTION: ABD PAIN, DIARRHEA  . Penicillins     Patient Measurements:    Vital Signs: Temp: 98.2 F (36.8 C) (05/23 1733) Temp src: Oral (05/23 1733) BP: 131/72 mmHg (05/23 1733) Pulse Rate: 47  (05/23 1733)  Labs:  [facility] 03/09/12 1412  HGB 10.9*  HCT 32.4*  PLT 166  APTT --  LABPROT 28.3*  INR 2.60*  HEPARINUNFRC --  CREATININE 1.77*  CKTOTAL --  CKMB --  TROPONINI <0.30    The CrCl is unknown because both a height and weight (above a minimum accepted value) are required for this calculation.   Medical History: Past Medical History  Diagnosis Date  . CAD (coronary artery disease)     inferior MI in 2003 w BMS to RCA. DES to CFX in 2008  . Diverticulitis, colon   . Leg pain     and decrease pulses consistent w peripheral vasc disease  . Paroxysmal atrial fibrillation     w history of DCCV  . Leg edema     L>R chronically  . Memory difficulty   . CKD (chronic kidney disease)   . Osteoporosis     s/p 5+ years of fosamax treatment as of 2013  . GERD (gastroesophageal reflux disease)   . Hyperlipidemia   . Hypertension   . CHF (congestive heart failure)     diastolic: Echo (4/11) w EF 50-60%, moderate MR, moderate to sever LAE, severe RAE, moderate to sever TR    Medications:  Prescriptions prior to admission  Medication Sig Dispense Refill  . atorvastatin (LIPITOR) 80 MG tablet TAKE ONE TABLET BY MOUTH ONE TIME DAILY  30 tablet  5  . doxazosin (CARDURA) 8 MG tablet Take 1/2 tab qd  30 tablet  6  . ferrous sulfate 325 (65 FE) MG tablet Take 325 mg by mouth daily with breakfast.        . furosemide (LASIX) 40 MG tablet Take 2 tablets in the morning and 1 tablet in the evening  90 tablet  6  . memantine (NAMENDA) 10 MG tablet Take 1 tablet (10 mg total) by mouth 2 (two) times daily.  60 tablet   5  . nitroGLYCERIN (NITROSTAT) 0.4 MG SL tablet Place 0.4 mg under the tongue every 5 (five) minutes x 3 doses as needed. For chest pain.      . potassium chloride SA (K-DUR,KLOR-CON) 20 MEQ tablet Take 1 tablet (20 mEq total) by mouth 2 (two) times daily.  60 tablet  1  . ramipril (ALTACE) 10 MG capsule Take 1 capsule (10 mg total) by mouth 2 (two) times daily.  60 capsule  6  . warfarin (COUMADIN) 5 MG tablet Take 5-10 mg by mouth See admin instructions. 1 tab daily except for 2 tabs daily on Mondays and Fridays.        Assessment:  78 yo M admit with syncope and hx of CHF, CAD, pAfib on chronic warfarin, chronic anemia, CKD stage 2-3, dementia, bradycardia, hypokalemia.  PTA warfarin dose was 5mg  daily except for 10mg  daily on Mondays and Fridays.  Last dose 03/09/12  INR is therapeutic on admission  Goal of Therapy:  INR 2-3   Plan:   No further warfarin dose today (already took home dose)  Continue warfarin per home dose as entered  Daily INR  Lynann Beaver PharmD, BCPS  Pager [phone] 03/09/2012 6:12 PM

## 2012-03-09 NOTE — H&P (Signed)
Physician History and Physical    Patient ID: Tony Reed MRN: [id number] DOB/AGE: [birth year] 78 y.o. Admit date: 03/09/2012  Primary Care Physician: Dr. Para March Primary Cardiologist [name] MD  HPI: Tony Reed is an 78 year old white male with history of coronary disease, diastolic congestive heart failure, and paroxysmal atrial fibrillation who is seen today for evaluation of a syncopal episode. The patient was visiting his wife who is in the hospital. According to the son he experienced some chest tightness and took a nitroglycerin sublingual. Afterwards he felt lightheaded. He sat down in a chair and then suddenly collapsed to the floor. According to the son his father has dementia and uses nitroglycerin often without even being aware of why he is taking it. The patient currently states that he is back to normal. He denies any dizziness or lightheadedness. He has no chest tightness, pain, or shortness of breath. He has no known history of syncope. He was recently evaluated in the office by Dr. Shirlee Latch. Prior to that visit he experienced some chest pain when he got lost while driving to [place]. This resolved with 2 sublingual nitroglycerin. He had a nuclear stress test on 03/02/2012 which was normal. Ejection fraction was also normal. His last echocardiogram in 2011 showed moderate mitral insufficiency and moderate to severe tricuspid insufficiency with severe biatrial enlargement. He had normal LV function. He denies any recent acceleration of his chest pain. He denies any shortness of breath orthopnea. He does have chronic lower extremity edema. His resting heart rate is typically slow with a pulse of 49 noted on his recent visit. He is on no rate control medication. He has a history of coronary disease with remote inferior myocardial infarction in 2003 treated with a bare-metal stent to the right coronary. He had stenting of the left circumflex coronary and 2008 with a drug-eluting  stent. He has a history of chronic anemia and chronic kidney disease stage II.  Review of systems is complete and found to be negative unless listed above.  Past Medical History  Diagnosis Date  . CAD (coronary artery disease)     inferior MI in 2003 w BMS to RCA. DES to CFX in 2008  . Diverticulitis, colon   . Leg pain     and decrease pulses consistent w peripheral vasc disease  . Paroxysmal atrial fibrillation     w history of DCCV  . Leg edema     L>R chronically  . Memory difficulty   . CKD (chronic kidney disease)   . Osteoporosis     s/p 5+ years of fosamax treatment as of 2013  . GERD (gastroesophageal reflux disease)   . Hyperlipidemia   . Hypertension   . CHF (congestive heart failure)     diastolic: Echo (4/11) w EF 50-60%, moderate MR, moderate to sever LAE, severe RAE, moderate to sever TR    Family History  Problem Relation Age of Onset  . Hypertension Mother   . Leukemia Mother   . Lung cancer Brother     smoking    History   Social History  . Marital Status: Married    Spouse Name: N/A    Number of Children: 6  . Years of Education: N/A   Occupational History  .     Social History Main Topics  . Smoking status: Never Smoker   . Smokeless tobacco: Never Used  . Alcohol Use: No  . Drug Use: No  . Sexually Active: Yes   Other Topics  Concern  . Not on file   Social History Narrative   Retired. Lives w wife in [place].     Past Surgical History  Procedure Date  . Herniorraphy     x2  . Poypectomy   . Hemmoroidectoy   . Choleycystectomy 1996  . Cardiac catheterization 1986    normal  . Flexible sigmoidoscopy 1/98  . Cath, stents 09/27/02  . Stent in distal circum 04/17/02  . Adenosine cardiolite 10/03/02    old infarct inf o/w negative EF 61% 10/03/02  . Hosp. mch r/o 01/06/04  . Cystoscopy 09/26/03    Vonita Moss  . Esophagogastroduodenoscopy 12/05/03    postiive H-Pylori, H.H., stricture   . Colonoscopy 12/13/08    mod divertics no  polyps (Dr Juanda Chance)  . Cath 06/25/09    nonobstructive disease afib (Dr. Juanda Chance)  . Echo multiple findings 01/18/10  . Cardioversion 02/03/10    successful on Mutaq (Dr. Juanda Chance)   . Ct 11/21/03    abd mod H. H., CT pelvis elevated prostate       Physical Exam: Blood pressure 113/51, pulse 42, temperature 98.2 F (36.8 C), temperature source Oral, resp. rate 14, SpO2 100.00%.   Patient is an elderly white male in no acute distress. He is somewhat slow to respond to questioning. He is pale. HEENT exam: He is normocephalic, atraumatic. Pupils are equal round and reactive. His sclera are clear. Oropharynx is clear and tongue is midline. He is edentulous. Neck is supple without JVD, adenopathy, thyromegaly, or bruits. Lungs: Clear to auscultation and percussion. Heart: PMI is normal. He has a regular rate and rhythm without gallop. S1 and S2 are normal. He is a grade 2/6 systolic murmur heard best at left lower sternal border and apex. There are no diastolic murmurs. Abdomen: Soft and nontender. No masses or organomegaly. Bowel sounds are positive. He has no bruits. Extremities: He has no cyanosis or clubbing. He has 2+ [place] any edema left greater than right. Pedal pulses are palpable. His toenails are hyperkeratotic. Neurologic : He is oriented x3. He responds to questions appropriately. His short-term memory is poor. He moves all extremities normally. Cranial nerves II through XII are intact. Skin: Warm and dry.  Labs:   Lab Results  Component Value Date   WBC 4.5 03/09/2012   HGB 10.9* 03/09/2012   HCT 32.4* 03/09/2012   MCV 93.6 03/09/2012   PLT 166 03/09/2012    Lab 03/09/12 1412  NA 138  K 3.3*  CL 99  CO2 26  BUN 25*  CREATININE 1.77*  CALCIUM 9.4  PROT 7.5  BILITOT 0.7  ALKPHOS 48  ALT 15  AST 21  GLUCOSE 117*   Lab Results  Component Value Date   TROPONINI <0.30 03/09/2012    Lab Results  Component Value Date   CHOL 122 03/02/2012   CHOL 134 07/05/2011   CHOL 158  04/08/2011   Lab Results  Component Value Date   HDL 47.20 03/02/2012   HDL 29.56 07/05/2011   HDL 21.30 04/08/2011   Lab Results  Component Value Date   LDLCALC 60 03/02/2012   LDLCALC 65 07/05/2011   LDLCALC 91 04/08/2011   Lab Results  Component Value Date   TRIG 76.0 03/02/2012   TRIG 97.0 07/05/2011   TRIG 88.0 04/08/2011   Lab Results  Component Value Date   CHOLHDL 3 03/02/2012   CHOLHDL 3 07/05/2011   CHOLHDL 3 04/08/2011   No results found for this basename: LDLDIRECT  Radiology: Cardiomegaly with a large hiatal hernia. Mild right basilar atelectasis. EKG: Sinus bradycardia with heart rate of 42 beats per minute. RSR prime in V2 suggests right ventricular conduction delay. No acute ST or T wave changes.  ASSESSMENT AND PLAN:  1. Syncope. I suspect this is related to his use of sublingual nitroglycerin with vasodilatation and hypotension. This may be exacerbated by his bradycardia. No evidence of acute coronary syndrome at this time. 2. Chronic congestive heart failure secondary to diastolic dysfunction. He appears to be at his baseline. He has chronic lower extremities edema. Lungs are clear. 3. Coronary disease with remote history of inferior myocardial infarction. Her bare-metal stent to the right coronary 2003. Drug-eluting stent to the left circumflex coronary in 2008. Recent Myoview study was normal. Unclear of the significance of his recent chest tightness. 4. Paroxysmal atrial fibrillation, on chronic Coumadin therapy. 5. Chronic anemia with iron deficiency. 6. Chronic kidney disease stage 2-3. 7. Chronic dementia. 8. Marked bradycardia on no rate slowing medication. 9. Hypokalemia.  Plan: We'll admit for observation on telemetry. Will observe for worsening bradycardia or pulses. We'll hold nitrates. Given her relatively low blood pressure we will hold his ACE inhibitor and diuretics tonight. Consider resuming tomorrow depending on his status. We will obtain an  echocardiogram. We will cycle cardiac enzymes. Unless he has evidence of myocardial infarction by cardiac enzymes don't feel that he needs further ischemic workup given his normal stress test recently. Continue Coumadin therapy per pharmacy protocol. Reassess renal function in the morning. Recheck potassium in the morning. Check magnesium and TSH.   Signed: Dusty Wagoner [place] 03/09/2012, 4:11 PM

## 2012-03-09 NOTE — ED Provider Notes (Signed)
History     CSN: [id number]  Arrival date & time 03/09/12  1400   First MD Initiated Contact with Patient 03/09/12 1401      Chief Complaint  Patient presents with  . Chest Pain    (Consider location/radiation/quality/duration/timing/severity/associated sxs/prior treatment) Patient is a 78 y.o. male presenting with chest pain. The history is provided by a caregiver (pt had chest pain in the icu and took a nitro then had a syncopal episode). No language interpreter was used.  Chest Pain The chest pain began less than 1 hour ago. Chest pain occurs frequently. The chest pain is resolved. The pain is associated with stress. At its most intense, the pain is at 5/10. The pain is currently at 0/10. The severity of the pain is moderate. The quality of the pain is described as aching. The pain does not radiate. Chest pain is worsened by stress. Primary symptoms include a fever. Pertinent negatives for primary symptoms include no fatigue, no cough and no abdominal pain.  Pertinent negatives for associated symptoms include no diaphoresis.  Pertinent negatives for past medical history include no seizures.     Past Medical History  Diagnosis Date  . CAD (coronary artery disease)     inferior MI in 2003 w BMS to RCA. DES to CFX in 2008  . Diverticulitis, colon   . Leg pain     and decrease pulses consistent w peripheral vasc disease  . Paroxysmal atrial fibrillation     w history of DCCV  . Leg edema     L>R chronically  . Memory difficulty   . CKD (chronic kidney disease)   . Osteoporosis     s/p 5+ years of fosamax treatment as of 2013  . GERD (gastroesophageal reflux disease)   . Hyperlipidemia   . Hypertension   . CHF (congestive heart failure)     diastolic: Echo (4/11) w EF 50-60%, moderate MR, moderate to sever LAE, severe RAE, moderate to sever TR    Past Surgical History  Procedure Date  . Herniorraphy     x2  . Poypectomy   . Hemmoroidectoy   . Choleycystectomy 1996  .  Cardiac catheterization 1986    normal  . Flexible sigmoidoscopy 1/98  . Cath, stents 09/27/02  . Stent in distal circum 04/17/02  . Adenosine cardiolite 10/03/02    old infarct inf o/w negative EF 61% 10/03/02  . Hosp. mch r/o 01/06/04  . Cystoscopy 09/26/03    Vonita Moss  . Esophagogastroduodenoscopy 12/05/03    postiive H-Pylori, H.H., stricture   . Colonoscopy 12/13/08    mod divertics no polyps (Dr Juanda Chance)  . Cath 06/25/09    nonobstructive disease afib (Dr. Juanda Chance)  . Echo multiple findings 01/18/10  . Cardioversion 02/03/10    successful on Mutaq (Dr. Juanda Chance)   . Ct 11/21/03    abd mod H. H., CT pelvis elevated prostate    Family History  Problem Relation Age of Onset  . Hypertension Mother   . Leukemia Mother   . Lung cancer Brother     smoking    History  Substance Use Topics  . Smoking status: Never Smoker   . Smokeless tobacco: Never Used  . Alcohol Use: No      Review of Systems  Constitutional: Positive for fever. Negative for diaphoresis and fatigue.  HENT: Negative for congestion, sinus pressure and ear discharge.   Eyes: Negative for discharge.  Respiratory: Negative for cough.   Cardiovascular: Positive for chest pain.  Gastrointestinal: Negative for abdominal pain and diarrhea.  Genitourinary: Negative for frequency and hematuria.  Musculoskeletal: Negative for back pain.  Skin: Negative for rash.  Neurological: Negative for seizures and headaches.  Hematological: Negative.   Psychiatric/Behavioral: Negative for hallucinations.    Allergies  Naproxen and Penicillins  Home Medications   Current Outpatient Rx  Name Route Sig Dispense Refill  . ATORVASTATIN CALCIUM 80 MG PO TABS  TAKE ONE TABLET BY MOUTH ONE TIME DAILY 30 tablet 5  . DOXAZOSIN MESYLATE 8 MG PO TABS  Take 1/2 tab qd 30 tablet 6  . FERROUS SULFATE 325 (65 FE) MG PO TABS Oral Take 325 mg by mouth daily with breakfast.      . FUROSEMIDE 40 MG PO TABS  Take 2 tablets in the morning and 1  tablet in the evening 90 tablet 6  . MEMANTINE HCL 10 MG PO TABS Oral Take 1 tablet (10 mg total) by mouth 2 (two) times daily. 60 tablet 5  . NITROGLYCERIN 0.4 MG SL SUBL Sublingual Place 0.4 mg under the tongue as directed. As needed      . POTASSIUM CHLORIDE CRYS ER 20 MEQ PO TBCR Oral Take 1 tablet (20 mEq total) by mouth 2 (two) times daily. 60 tablet 1  . RAMIPRIL 10 MG PO CAPS Oral Take 1 capsule (10 mg total) by mouth 2 (two) times daily. 60 capsule 6  . WARFARIN SODIUM 5 MG PO TABS  Take as directed by Anticoagulation clinic 60 tablet 0    BP 113/51  Pulse 42  Temp(Src) 98.2 F (36.8 C) (Oral)  Resp 14  SpO2 100%  Physical Exam  Constitutional: He is oriented to person, place, and time. He appears well-developed.  HENT:  Head: Normocephalic and atraumatic.  Eyes: Conjunctivae and EOM are normal. No scleral icterus.  Neck: Neck supple. No thyromegaly present.  Cardiovascular: Regular rhythm.  Exam reveals no gallop and no friction rub.   No murmur heard.      bradycardia  Pulmonary/Chest: No stridor. He has no wheezes. He has no rales. He exhibits no tenderness.  Abdominal: He exhibits no distension. There is no tenderness. There is no rebound.  Musculoskeletal: Normal range of motion. He exhibits no edema.  Lymphadenopathy:    He has no cervical adenopathy.  Neurological: He is oriented to person, place, and time. Coordination normal.  Skin: No rash noted. No erythema.  Psychiatric: He has a normal mood and affect. His behavior is normal.    ED Course  Procedures (including critical care time)  Labs Reviewed  CBC - Abnormal; Notable for the following:    RBC 3.46 (*)    Hemoglobin 10.9 (*)    HCT 32.4 (*)    All other components within normal limits  COMPREHENSIVE METABOLIC PANEL - Abnormal; Notable for the following:    Potassium 3.3 (*)    Glucose, Bld 117 (*)    BUN 25 (*)    Creatinine, Ser 1.77 (*)    GFR calc non Af Amer 34 (*)    GFR calc Af Amer 39  (*)    All other components within normal limits  PRO B NATRIURETIC PEPTIDE - Abnormal; Notable for the following:    Pro B Natriuretic peptide (BNP) 698.4 (*)    All other components within normal limits  PROTIME-INR - Abnormal; Notable for the following:    Prothrombin Time 28.3 (*)    INR 2.60 (*)    All other components within normal limits  DIFFERENTIAL  TROPONIN I   Dg Chest Portable 1 View  03/09/2012  *RADIOLOGY REPORT*  Clinical Data: Chest pain  PORTABLE CHEST - 1 VIEW  Comparison: Portable exam 1421 hours compared to 11/21/2003  Findings: Enlargement of cardiac silhouette. Calcified thoracic aorta. Large hiatal hernia. Pulmonary vascularity normal. Mild right basilar atelectasis. Question underlying emphysematous changes. No definite infiltrate, pleural effusion or pneumothorax. Bones demineralized.  IMPRESSION: Enlargement cardiac silhouette. Large hiatal hernia. Probable emphysematous changes with mild right basilar atelectasis.  Original Report Authenticated By: Lollie Marrow, M.D.     No diagnosis found.   Date: 03/09/2012  Rate: 55  Rhythm: sinus bradycardia  QRS Axis: normal  Intervals: normal  ST/T Wave abnormalities: normal  Conduction Disutrbances:none  Narrative Interpretation:   Old EKG Reviewed: none available    MDM          Benny Lennert, MD 03/09/12 1549

## 2012-03-10 DIAGNOSIS — I509 Heart failure, unspecified: Secondary | ICD-10-CM

## 2012-03-10 DIAGNOSIS — I369 Nonrheumatic tricuspid valve disorder, unspecified: Secondary | ICD-10-CM

## 2012-03-10 DIAGNOSIS — I2510 Atherosclerotic heart disease of native coronary artery without angina pectoris: Secondary | ICD-10-CM

## 2012-03-10 DIAGNOSIS — I5032 Chronic diastolic (congestive) heart failure: Secondary | ICD-10-CM

## 2012-03-10 LAB — CBC
HCT: 34 % — ABNORMAL LOW (ref 39.0–52.0)
Hemoglobin: 11.4 g/dL — ABNORMAL LOW (ref 13.0–17.0)
MCV: 94.2 fL (ref 78.0–100.0)
RBC: 3.61 MIL/uL — ABNORMAL LOW (ref 4.22–5.81)
WBC: 3.6 10*3/uL — ABNORMAL LOW (ref 4.0–10.5)

## 2012-03-10 LAB — BASIC METABOLIC PANEL
Calcium: 9.1 mg/dL (ref 8.4–10.5)
Creatinine, Ser: 1.66 mg/dL — ABNORMAL HIGH (ref 0.50–1.35)
GFR calc non Af Amer: 37 mL/min — ABNORMAL LOW (ref >90)
Glucose, Bld: 100 mg/dL — ABNORMAL HIGH (ref 70–99)
Sodium: 139 mEq/L (ref 135–145)

## 2012-03-10 LAB — CARDIAC PANEL(CRET KIN+CKTOT+MB+TROPI)
CK, MB: 3.3 ng/mL (ref 0.3–4.0)
Relative Index: 2 (ref 0.0–2.5)
Total CK: 162 U/L (ref 7–232)
Total CK: 189 U/L (ref 7–232)
Troponin I: <0.3 ng/mL (ref <0.30)

## 2012-03-10 LAB — TSH: TSH: 1.127 u[IU]/mL (ref 0.350–4.500)

## 2012-03-10 MED ORDER — RAMIPRIL 10 MG PO CAPS
10.0000 mg | ORAL_CAPSULE | Freq: Two times a day (BID) | ORAL | Status: DC
  Administered 2012-03-10 – 2012-03-15 (×11): 10 mg via ORAL
  Filled 2012-03-10 (×12): qty 1

## 2012-03-10 MED ORDER — SODIUM CHLORIDE 0.9 % IV SOLN
INTRAVENOUS | Status: AC
  Administered 2012-03-10: 08:00:00 via INTRAVENOUS

## 2012-03-10 MED ORDER — WARFARIN - PHARMACIST DOSING INPATIENT: Freq: Every day | Status: DC

## 2012-03-10 NOTE — Progress Notes (Signed)
SUBJECTIVE: No complaints this am. He has been bradycardic with short pauses but pt does not feel dizzy. No chesst pain or SOB  BP 155/83  Pulse 58  Temp(Src) 97.6 F (36.4 C) (Oral)  Resp 17  Wt 184 lb 15.5 oz (83.9 kg)  SpO2 100%  Intake/Output Summary (Last 24 hours) at 03/10/12 4098 Last data filed at 03/10/12 0231  Gross per 24 hour  Intake      0 ml  Output   2575 ml  Net  -2575 ml    PHYSICAL EXAM General: Well developed, well nourished, in no acute distress. Alert but has memory issues.   Psych:  Good affect, responds appropriately Neck: No JVD. No masses noted.  Lungs: Clear bilaterally with no wheezes or rhonci noted.  Heart: Bradycardic with no murmurs noted. Abdomen: Bowel sounds are present. Soft, non-tender.  Extremities: No lower extremity edema.   LABS: Basic Metabolic Panel:  Basename 03/10/12 0150 03/09/12 1830 03/09/12 1412  NA 139 -- 138  K 3.6 -- 3.3*  CL 99 -- 99  CO2 29 -- 26  GLUCOSE 100* -- 117*  BUN 27* -- 25*  CREATININE 1.66* -- 1.77*  CALCIUM 9.1 -- 9.4  MG -- 2.1 --  PHOS -- -- --   CBC:  Basename 03/09/12 1412  WBC 4.5  NEUTROABS 2.2  HGB 10.9*  HCT 32.4*  MCV 93.6  PLT 166   Cardiac Enzymes:  Basename 03/10/12 0150 03/09/12 1830 03/09/12 1412  CKTOTAL 162 188 --  CKMB 3.3 4.0 --  CKMBINDEX -- -- --  TROPONINI <0.30 <0.30 <0.30    Current Meds:    . atorvastatin  80 mg Oral q1800  . doxazosin  4 mg Oral Daily  . ferrous sulfate  325 mg Oral Q breakfast  . memantine  10 mg Oral BID  . potassium chloride SA  20 mEq Oral BID  . sodium chloride  3 mL Intravenous Q12H  . warfarin  10 mg Oral Custom  . warfarin  5 mg Oral Custom  . Warfarin - Physician Dosing Inpatient   Does not apply q1800  . DISCONTD: warfarin  5-10 mg Oral See admin instructions     ASSESSMENT AND PLAN: 78 yo male with history of CAD, diastolic heart failure, anemia, CKD, PAF, dementia admitted after syncopal episode following use of SL  NTG. He has been in sinus bradycardia on no rate control agents.   1. Syncope: Likely related to use of sublingual nitroglycerin with vasodilatation and hypotension with exacerbation by his bradycardia and probable volume depletion. No evidence of acute coronary syndrome. Orthostatics with 50-60 mm drop in SBP per nursing. Will hydrate gently today.   2. Chronic congestive heart failure secondary to diastolic dysfunction: Appears to be at his baseline volume status. He has chronic lower extremities edema which is mild. Lungs are clear. Plans for echo today per Dr. Elvis Coil admission assessment. Resume Ace-inh this am but will not restart his diuretic yet.   3. Coronary disease with remote history of inferior myocardial infarction: Bare-metal stent to the right coronary 2003. Drug-eluting stent to the left circumflex coronary in 2008. Recent Myoview study in May 2013 was normal. Cardiac enzymes negative. No ischemic evaluation necessary.    4. Paroxysmal atrial fibrillation: Maintaining sinus. On chronic Coumadin therapy.   5. Chronic anemia with iron deficiency: Stable  6. Chronic kidney disease stage 2-3: Stable  7. Chronic dementia: at baseline  8. Sinus bradycardia: On no rate slowing medication.  Will follow on telemetry for 24 more hours. Ambulate in hallways today with nursing and check HR response to activity.   9. Hypokalemia: Will replace this am.   No family available at bedside. His wife is in the ICU here at [facility]. I can be reached at [phone] (pager) when family available today.   Tony Reed  5/24/20136:32 AM

## 2012-03-10 NOTE — Progress Notes (Signed)
   CARE MANAGEMENT NOTE 03/10/2012  Patient:  Tony Reed, Tony Reed   Account Number:  [id number]  Date Initiated:  03/10/2012  Documentation initiated by:  Jiles Crocker  Subjective/Objective Assessment:   ADMITTED WITH SYNCOPAL EPISODE     Action/Plan:   Primary Care Physician: Dr. Para March  Primary Cardiologist [name] MD  LIVES AT HOME WITH SPOUSE/ SPOUSE IS CURRENTLY IN THE ICU AS A PATIENT.   Anticipated DC Date:  03/18/2012   Anticipated DC Plan:  HOME/SELF CARE          Status of service:  In process, will continue to follow Medicare Important Message given?  NA - LOS <3 / Initial given by admissions (If response is "NO", the following Medicare IM given date fields will be blank)  Per UR Regulation:  Reviewed for med. necessity/level of care/duration of stay  Comments:  03/10/2012- B Analisa Sledd RN, BSN, MHA

## 2012-03-10 NOTE — Progress Notes (Signed)
Pt had a 2.15 second pause, then returns to SB rate low to mid 40s.  BP 142/68, denies pain, alert and oriented.  Asymptomatic.  Dr Charm Barges notified.  EKG obtained and Dr Charm Barges made aware of results.  Will continue to monitor.

## 2012-03-10 NOTE — Progress Notes (Signed)
Pt's heart rate sustaining mid 30s.  Upon arrival to room, pt is asleep, awakens easily, denies pain or discomfort.  BP 130/70.  No s/s of acute distress.  Dr Charm Barges made aware of events; will monitor.

## 2012-03-10 NOTE — Progress Notes (Signed)
ANTICOAGULATION CONSULT NOTE - Follow Up Consult  Pharmacy Consult for Coumadin Indication: atrial fibrillation  Allergies  Allergen Reactions  . Naproxen     REACTION: ABD PAIN, DIARRHEA  . Penicillins     Patient Measurements: Height: 5\' 11"  (180.3 cm) Weight: 184 lb 15.5 oz (83.9 kg) IBW/kg (Calculated) : 75.3   Vital Signs: Temp: 97.6 F (36.4 C) (05/24 0536) Temp src: Oral (05/24 0536) BP: 155/83 mmHg (05/24 0542) Pulse Rate: 58  (05/24 0542)  Labs:  Basename 03/10/12 0150 03/09/12 1830 03/09/12 1412  HGB -- -- 10.9*  HCT -- -- 32.4*  PLT -- -- 166  APTT -- -- --  LABPROT 30.8* -- 28.3*  INR 2.90* -- 2.60*  HEPARINUNFRC -- -- --  CREATININE 1.66* -- 1.77*  CKTOTAL 162 188 --  CKMB 3.3 4.0 --  TROPONINI <0.30 <0.30 <0.30    Estimated Creatinine Clearance: 35.9 ml/min (by C-G formula based on Cr of 1.66).   Medications:  Scheduled:    . atorvastatin  80 mg Oral q1800  . doxazosin  4 mg Oral Daily  . ferrous sulfate  325 mg Oral Q breakfast  . memantine  10 mg Oral BID  . potassium chloride SA  20 mEq Oral BID  . ramipril  10 mg Oral BID  . sodium chloride  3 mL Intravenous Q12H  . warfarin  10 mg Oral Custom  . warfarin  5 mg Oral Custom  . Warfarin - Physician Dosing Inpatient   Does not apply q1800  . DISCONTD: warfarin  5-10 mg Oral See admin instructions    Assessment: 78 yo M admit with syncope and hx of CHF, CAD, pAfib on chronic warfarin, chronic anemia, CKD stage 2-3, dementia, bradycardia, hypokalemia.  PTA warfarin dose was 5mg  daily except for 10mg  daily on Mondays and Fridays. Last dose 03/09/12  INR is therapeutic  Goal of Therapy:  INR 2-3   Plan:   Continue home regimen - due for 10mg  today  Daily PT/INR  Loralee Pacas, PharmD, BCPS Pager: [phone] 03/10/2012,10:18 AM

## 2012-03-10 NOTE — Progress Notes (Signed)
  Echocardiogram 2D Echocardiogram has been performed.  Cathie Beams Deneen 03/10/2012, 1:05 PM

## 2012-03-11 LAB — BASIC METABOLIC PANEL
CO2: 25 mEq/L (ref 19–32)
Chloride: 103 mEq/L (ref 96–112)
Glucose, Bld: 92 mg/dL (ref 70–99)
Potassium: 4 mEq/L (ref 3.5–5.1)
Sodium: 138 mEq/L (ref 135–145)

## 2012-03-11 LAB — PROTIME-INR: INR: 3.33 — ABNORMAL HIGH (ref 0.00–1.49)

## 2012-03-11 NOTE — Progress Notes (Signed)
ANTICOAGULATION CONSULT NOTE - Follow Up Consult  Pharmacy Consult for Coumadin Indication: atrial fibrillation  Allergies  Allergen Reactions  . Naproxen     REACTION: ABD PAIN, DIARRHEA  . Penicillins     Patient Measurements: Height: 5\' 11"  (180.3 cm) Weight: 185 lb 10 oz (84.2 kg) IBW/kg (Calculated) : 75.3   Vital Signs: Temp: 97.4 F (36.3 C) (05/25 0608) Temp src: Oral (05/25 0608) BP: 151/75 mmHg (05/25 0608) Pulse Rate: 58  (05/25 0608)  Labs:  Basename 03/11/12 0534 03/10/12 0947 03/10/12 0150 03/09/12 1830 03/09/12 1412  HGB -- 11.4* -- -- 10.9*  HCT -- 34.0* -- -- 32.4*  PLT -- 165 -- -- 166  APTT -- -- -- -- --  LABPROT 34.3* -- 30.8* -- 28.3*  INR 3.33* -- 2.90* -- 2.60*  HEPARINUNFRC -- -- -- -- --  CREATININE 1.51* -- 1.66* -- 1.77*  CKTOTAL -- 189 162 188 --  CKMB -- 3.9 3.3 4.0 --  TROPONINI -- <0.30 <0.30 <0.30 --    Estimated Creatinine Clearance: 39.5 ml/min (by C-G formula based on Cr of 1.51).   Medications:  Scheduled:     . atorvastatin  80 mg Oral q1800  . doxazosin  4 mg Oral Daily  . ferrous sulfate  325 mg Oral Q breakfast  . memantine  10 mg Oral BID  . potassium chloride SA  20 mEq Oral BID  . ramipril  10 mg Oral BID  . sodium chloride  3 mL Intravenous Q12H  . warfarin  10 mg Oral Custom  . warfarin  5 mg Oral Custom  . Warfarin - Pharmacist Dosing Inpatient   Does not apply q1800  . DISCONTD: Warfarin - Physician Dosing Inpatient   Does not apply q1800    Assessment: 78 yo M admit with syncope and hx of CHF, CAD, pAfib on chronic warfarin, chronic anemia, CKD stage 2-3, dementia, bradycardia, hypokalemia.  PTA warfarin dose was 5mg  daily except for 10mg  daily on Mondays and Fridays.  10mg  warfarin given last night INR now supratherapeutic  Goal of Therapy:  INR 2-3   Plan:   Hold warfarin today  Daily PT/INR   Hessie Knows, PharmD, BCPS Pager [phone] 03/11/2012 8:45 AM

## 2012-03-11 NOTE — Progress Notes (Signed)
Subjective:  No c/o SOB or chest pain.  No more syncope. Having some sinus bradycardia and short pauses, but no significant pauses.  Objective:  Vital Signs in the last 24 hours: BP 151/75  Pulse 58  Temp(Src) 97.4 F (36.3 C) (Oral)  Resp 18  Ht 5\' 11"  (1.803 m)  Wt 84.2 kg (185 lb 10 oz)  BMI 25.89 kg/m2  SpO2 98%  Physical Exam: Elderly WM in NAD Lungs:  Clear  Cardiac:  Regular rhythm, normal S1 and S2, no S3 Abdomen:  Soft, nontender, no masses Extremities:  No edema present  Intake/Output from previous day: 05/24 0701 - 05/25 0700 In: 1333.3 [P.O.:960; I.V.:373.3] Out: 800 [Urine:800] Weight Filed Weights   03/09/12 2030 03/10/12 0542 03/11/12 0525  Weight: 83.9 kg (184 lb 15.5 oz) 83.9 kg (184 lb 15.5 oz) 84.2 kg (185 lb 10 oz)    Lab Results: Basic Metabolic Panel:  Basename 03/11/12 0534 03/10/12 0150  NA 138 139  K 4.0 3.6  CL 103 99  CO2 25 29  GLUCOSE 92 100*  BUN 25* 27*  CREATININE 1.51* 1.66*    CBC:  Basename 03/10/12 0947 03/09/12 1412  WBC 3.6* 4.5  NEUTROABS -- 2.2  HGB 11.4* 10.9*  HCT 34.0* 32.4*  MCV 94.2 93.6  PLT 165 166    BNP    Component Value Date/Time   PROBNP 698.4* 03/09/2012 1412    PROTIME: Lab Results  Component Value Date   INR 3.33* 03/11/2012   INR 2.90* 03/10/2012   INR 2.60* 03/09/2012    Telemetry: Sinus bradycardia with occasional pauses short none > 3 seconds  ECHO: Normal LV EF  Assessment/Plan:  1.Syncope likely due to orthostasis/NTG 2. Sinus bradycardia but not enough to warrant pacer 3. History of PAF  Rec:  Ambulate and check orthostatics.  As wife is in the hospital with GI bleed coordinate care.  May be able to go home in am.  W. Ashley Royalty  MD [facility] Cardiology  03/11/2012, 8:28 AM

## 2012-03-12 LAB — PROTIME-INR
INR: 3.86 — ABNORMAL HIGH (ref 0.00–1.49)
Prothrombin Time: 38.5 seconds — ABNORMAL HIGH (ref 11.6–15.2)

## 2012-03-12 NOTE — Progress Notes (Signed)
ANTICOAGULATION CONSULT NOTE - Follow Up Consult  Pharmacy Consult for Coumadin Indication: atrial fibrillation  Allergies  Allergen Reactions  . Naproxen     REACTION: ABD PAIN, DIARRHEA  . Penicillins     Patient Measurements: Height: 5\' 11"  (180.3 cm) Weight: 186 lb 1.1 oz (84.4 kg) IBW/kg (Calculated) : 75.3   Vital Signs: Temp: 97.6 F (36.4 C) (05/26 0549) Temp src: Oral (05/26 0549) BP: 131/69 mmHg (05/26 0557) Pulse Rate: 53  (05/26 0557)  Labs:  Alvira Philips 03/12/12 0407 03/11/12 0534 03/10/12 0947 03/10/12 0150 03/09/12 1830 03/09/12 1412  HGB -- -- 11.4* -- -- 10.9*  HCT -- -- 34.0* -- -- 32.4*  PLT -- -- 165 -- -- 166  APTT -- -- -- -- -- --  LABPROT 38.5* 34.3* -- 30.8* -- --  INR 3.86* 3.33* -- 2.90* -- --  HEPARINUNFRC -- -- -- -- -- --  CREATININE -- 1.51* -- 1.66* -- 1.77*  CKTOTAL -- -- 189 162 188 --  CKMB -- -- 3.9 3.3 4.0 --  TROPONINI -- -- <0.30 <0.30 <0.30 --    Estimated Creatinine Clearance: 39.5 ml/min (by C-G formula based on Cr of 1.51).   Medications:  Scheduled:     . atorvastatin  80 mg Oral q1800  . doxazosin  4 mg Oral Daily  . ferrous sulfate  325 mg Oral Q breakfast  . memantine  10 mg Oral BID  . potassium chloride SA  20 mEq Oral BID  . ramipril  10 mg Oral BID  . sodium chloride  3 mL Intravenous Q12H  . Warfarin - Pharmacist Dosing Inpatient   Does not apply q1800    Assessment: 78 yo M admit with syncope and hx of CHF, CAD, pAfib on chronic warfarin, chronic anemia, CKD stage 2-3, dementia, bradycardia, hypokalemia.  PTA warfarin dose was 5mg  daily except for 10mg  daily on Mondays and Fridays.  10mg  warfarin given last night INR supratherapeutic again today and rose again compared to yesterday  Goal of Therapy:  INR 2-3   Plan:   Hold warfarin again today  Daily PT/INR   Hessie Knows, PharmD, BCPS Pager [phone] 03/12/2012 11:15 AM

## 2012-03-12 NOTE — Progress Notes (Signed)
Subjective:  No c/o SOB or chest pain.  No more syncope. Having some sinus bradycardia and short pauses, but no significant pauses.  Objective:  Vital Signs in the last 24 hours: BP 131/69  Pulse 53  Temp(Src) 97.6 F (36.4 C) (Oral)  Resp 18  Ht 5\' 11"  (1.803 m)  Wt 84.4 kg (186 lb 1.1 oz)  BMI 25.95 kg/m2  SpO2 97%  Physical Exam: Elderly WM in NAD Lungs:  Clear  Cardiac:  Regular rhythm, normal S1 and S2, no S3 Abdomen:  Soft, nontender, no masses Extremities:  No edema present  Intake/Output from previous day: 05/25 0701 - 05/26 0700 In: 360 [P.O.:360] Out: -  Weight Filed Weights   03/10/12 0542 03/11/12 0525 03/12/12 0549  Weight: 83.9 kg (184 lb 15.5 oz) 84.2 kg (185 lb 10 oz) 84.4 kg (186 lb 1.1 oz)    Lab Results: Basic Metabolic Panel:  Basename 03/11/12 0534 03/10/12 0150  NA 138 139  K 4.0 3.6  CL 103 99  CO2 25 29  GLUCOSE 92 100*  BUN 25* 27*  CREATININE 1.51* 1.66*   CBC:  Basename 03/10/12 0947 03/09/12 1412  WBC 3.6* 4.5  NEUTROABS -- 2.2  HGB 11.4* 10.9*  HCT 34.0* 32.4*  MCV 94.2 93.6  PLT 165 166    BNP    Component Value Date/Time   PROBNP 698.4* 03/09/2012 1412    PROTIME: Lab Results  Component Value Date   INR 3.86* 03/12/2012   INR 3.33* 03/11/2012   INR 2.90* 03/10/2012    Telemetry: Sinus bradycardia with occasional pauses short none > 3 seconds  ECHO: Normal LV EF  Assessment/Plan:  1.Syncope likely due to orthostasis/NTG 2. Sinus bradycardia but not enough to warrant pacer 3. History of PAF  Rec:  Ambulate and check orthostatics.  As wife is in the hospital with GI bleed coordinate care.  Home when wife goes home.   Darden Palmer  MD [facility] Cardiology  03/12/2012, 9:55 AM

## 2012-03-13 DIAGNOSIS — R079 Chest pain, unspecified: Secondary | ICD-10-CM

## 2012-03-13 DIAGNOSIS — I4891 Unspecified atrial fibrillation: Secondary | ICD-10-CM

## 2012-03-13 LAB — PROTIME-INR: Prothrombin Time: 36 seconds — ABNORMAL HIGH (ref 11.6–15.2)

## 2012-03-13 NOTE — Progress Notes (Signed)
Patient ID: Tony Reed, male   DOB: 06-06-1926, 78 y.o.   MRN: [id number] Subjective:  No c/o SOB or chest pain.  No more syncope. Having some sinus bradycardia and short pauses, but no significant pauses. Dementia.  Unable to go home by himself.  Wife in next room  Objective:  Vital Signs in the last 24 hours: BP 128/69  Pulse 45  Temp(Src) 98.2 F (36.8 C) (Oral)  Resp 12  Ht 5\' 11"  (1.803 m)  Wt 83.825 kg (184 lb 12.8 oz)  BMI 25.77 kg/m2  SpO2 97%  Physical Exam: Elderly WM in NAD Lungs:  Clear  Cardiac:  Regular rhythm, normal S1 and S2, no S3 Abdomen:  Soft, nontender, no masses Extremities:  No edema present  Intake/Output from previous day: 05/26 0701 - 05/27 0700 In: 840 [P.O.:840] Out: 400 [Urine:400] Weight Filed Weights   03/11/12 0525 03/12/12 0549 03/13/12 0649  Weight: 84.2 kg (185 lb 10 oz) 84.4 kg (186 lb 1.1 oz) 83.825 kg (184 lb 12.8 oz)    Lab Results: Basic Metabolic Panel:  Basename 03/11/12 0534  NA 138  K 4.0  CL 103  CO2 25  GLUCOSE 92  BUN 25*  CREATININE 1.51*   CBC:  Basename 03/10/12 0947  WBC 3.6*  NEUTROABS --  HGB 11.4*  HCT 34.0*  MCV 94.2  PLT 165    BNP    Component Value Date/Time   PROBNP 698.4* 03/09/2012 1412    PROTIME: Lab Results  Component Value Date   INR 3.54* 03/13/2012   INR 3.86* 03/12/2012   INR 3.33* 03/11/2012    Telemetry: Sinus bradycardia with occasional pauses short none > 3 seconds  ECHO: Normal LV EF  Assessment/Plan:  1.Syncope likely due to orthostasis/NTG 2. Sinus bradycardia but not enough to warrant pacer 3. History of PAF  Rec:  Ambulate PT/OT   As wife is in the hospital with GI bleed coordinate care.  Home when wife goes home.   Darden Palmer  MD [facility] Cardiology  03/13/2012, 7:42 AM

## 2012-03-13 NOTE — Progress Notes (Signed)
CSW spoke with patient's son, Tony Reed (home#: [phone] cell#: [phone]) re: discharge planning. Patient's wife, Tony Reed is also admitted to [facility] room 1428, son states that he hopes to take both of his parents home @ discharge.   Unice Bailey, LCSWA [phone]

## 2012-03-13 NOTE — Progress Notes (Signed)
ANTICOAGULATION CONSULT NOTE - Follow Up Consult  Pharmacy Consult for Coumadin Indication: atrial fibrillation  Allergies  Allergen Reactions  . Naproxen     REACTION: ABD PAIN, DIARRHEA  . Penicillins     Patient Measurements: Height: 5\' 11"  (180.3 cm) Weight: 184 lb 12.8 oz (83.825 kg) IBW/kg (Calculated) : 75.3   Vital Signs: Temp: 98.2 F (36.8 C) (05/27 0624) Temp src: Oral (05/27 0624) BP: 130/62 mmHg (05/27 1001) Pulse Rate: 45  (05/27 0624)  Labs:  Tony Reed 03/13/12 0437 03/12/12 0407 03/11/12 0534  HGB -- -- --  HCT -- -- --  PLT -- -- --  APTT -- -- --  LABPROT 36.0* 38.5* 34.3*  INR 3.54* 3.86* 3.33*  HEPARINUNFRC -- -- --  CREATININE -- -- 1.51*  CKTOTAL -- -- --  CKMB -- -- --  TROPONINI -- -- --    Estimated Creatinine Clearance: 39.5 ml/min (by C-G formula based on Cr of 1.51).   Medications:  Scheduled:     . atorvastatin  80 mg Oral q1800  . doxazosin  4 mg Oral Daily  . ferrous sulfate  325 mg Oral Q breakfast  . memantine  10 mg Oral BID  . potassium chloride SA  20 mEq Oral BID  . ramipril  10 mg Oral BID  . sodium chloride  3 mL Intravenous Q12H  . Warfarin - Pharmacist Dosing Inpatient   Does not apply q1800    Assessment: 78 yo M admit with syncope and hx of CHF, CAD, pAfib on chronic warfarin, chronic anemia, CKD stage 2-3, dementia, bradycardia, hypokalemia.  PTA warfarin dose was 5mg  daily except for 10mg  daily on Mondays and Fridays.  10mg  warfarin given 5/24 INR remains above 3, but trending down. Po intake good.  Goal of Therapy:  INR 2-3   Plan:   Hold warfarin again today  Daily PT/INR   Otho Bellows , PharmD,  Pager [phone] 03/13/2012 11:39 AM

## 2012-03-14 DIAGNOSIS — I951 Orthostatic hypotension: Secondary | ICD-10-CM

## 2012-03-14 LAB — PROTIME-INR
INR: 2.82 — ABNORMAL HIGH (ref 0.00–1.49)
Prothrombin Time: 30.1 seconds — ABNORMAL HIGH (ref 11.6–15.2)

## 2012-03-14 MED ORDER — WARFARIN SODIUM 5 MG PO TABS
5.0000 mg | ORAL_TABLET | Freq: Once | ORAL | Status: AC
  Administered 2012-03-14: 5 mg via ORAL
  Filled 2012-03-14: qty 1

## 2012-03-14 MED ORDER — NITROGLYCERIN 0.4 MG SL SUBL: 0.4000 mg | SUBLINGUAL_TABLET | SUBLINGUAL | Status: DC | PRN

## 2012-03-14 MED ORDER — FUROSEMIDE 40 MG PO TABS: 40.0000 mg | ORAL_TABLET | Freq: Every day | ORAL | Status: DC

## 2012-03-14 NOTE — Progress Notes (Signed)
Patient ID: Tony Reed, male   DOB: [birth year], 78 y.o.   MRN: [id number] Subjective:  No c/o SOB or chest pain.  No more syncope. Having some sinus bradycardia and short pauses, but no significant pauses. Dementia.  Unable to go home by himself.  Wife in next room Ambulating  Objective:  Vital Signs in the last 24 hours: BP 133/74  Pulse 56  Temp(Src) 98.4 F (36.9 C) (Oral)  Resp 15  Ht 5\' 11"  (1.803 m)  Wt 84.142 kg (185 lb 8 oz)  BMI 25.87 kg/m2  SpO2 96%  Physical Exam: Elderly WM in NAD Lungs:  Clear  Cardiac:  Regular rhythm, normal S1 and S2, no S3 Abdomen:  Soft, nontender, no masses Extremities:  No edema present  Intake/Output from previous day: 05/27 0701 - 05/28 0700 In: 240 [P.O.:240] Out: 300 [Urine:300] Weight Filed Weights   03/12/12 0549 03/13/12 0649 03/14/12 0618  Weight: 84.4 kg (186 lb 1.1 oz) 83.825 kg (184 lb 12.8 oz) 84.142 kg (185 lb 8 oz)    BNP    Component Value Date/Time   PROBNP 698.4* 03/09/2012 1412    PROTIME: Lab Results  Component Value Date   INR 2.82* 03/14/2012   INR 3.54* 03/13/2012   INR 3.86* 03/12/2012    Telemetry: Sinus bradycardia with occasional pauses short none > 3 seconds  ECHO: Normal LV EF  Assessment/Plan:  1.Syncope likely due to orthostasis/NTG 2. Sinus bradycardia but not enough to warrant pacer 3. History of PAF  Rec:  Ambulate PT/OT   As wife is in the hospital with GI bleed coordinate care.  Home when wife goes home.   Charlton Haws 8:10 AM 03/14/2012  Cardiology  03/14/2012, 8:10 AM

## 2012-03-14 NOTE — Progress Notes (Signed)
Physical Therapy Note  Order received and chart reviewed.  Pt and spouse report pt has been independently going from his room to his spouse's room (1427 to 1428).  Pt with dementia however pt appears to be at baseline for mobility as he was observed ambulating in hallway with spouse.  No f/u PT recommendations at this time.  PT to sign off.  Zenovia Jarred, PT Pager: [phone]

## 2012-03-14 NOTE — Discharge Summary (Signed)
Patient ID: Tony Reed,  MRN: [id number], DOB/AGE: 11-22-1925 78 y.o.  Admit date: 03/09/2012 Discharge date: 03/14/2012  Primary Care Provider: Crawford Givens, MD Primary Cardiologist: Golden Circle, MD  Discharge Diagnoses Principal Problem:  *Syncope  **In setting of Nitrate usage and orthostasis  Active Problems:  Orthostatic hypotension  HYPERTENSION  CAD, NATIVE VESSEL  Atrial fibrillation  **Chronic Coumadin  DIASTOLIC HEART FAILURE, CHRONIC  **EF 60-65% by Echo 03/10/2012.  Hyperlipidemia  Memory loss  ANEMIA, IRON DEFICIENCY  Allergies Allergies  Allergen Reactions  . Naproxen     REACTION: ABD PAIN, DIARRHEA  . Penicillins     Procedures  2D Echocardiogram 03/10/2012 Study Conclusions  - Procedure narrative: Transthoracic echocardiography. Image   quality was adequate. The study was technically difficult. - Left ventricle: The cavity size was normal. Wall thickness   was normal. Systolic function was normal. The estimated   ejection fraction was in the range of 60% to 65%. - Aortic valve: Trivial regurgitation. - Tricuspid valve: Moderate regurgitation. - Pulmonary arteries: PA peak pressure: 43mm Hg (S). _____________  History of Present Illness  78 y/o male with the above problem list.  His wife has been hospitalized @ [facility] and on May 23, while visiting his wife in the hospital, pt developed chest tightness and subsequently took a sublingual NTG.  Following this, he complained of feeling lightheaded and then collapsed to the floor.  This was witnessed by his family.  He was taken down to the ED, where he was mildly hypotensive and bradycardic.  He was evaluated by cardiology and admitted for further evaluation.  Hospital Course  Pt r/o for MI.  He was hydrated and his diuretics were held.  He was inially found to be orthostatic with drops in SBP of 50-60 mmHg with standing.  With additional hydration BP's improved and he had no further symptoms  of presyncope.  An echocardiogram on 5/24, showed normal LV function.    He has been mildly bradycardic throughout admission, with rates in the 50's however has had no prolonged pauses.  Pt does require supervision @ home and his son plans to take him home today in good condition.  Discharge Vitals Blood pressure 133/74, pulse 56, temperature 98.4 F (36.9 C), temperature source Oral, resp. rate 15, height 5\' 11"  (1.803 m), weight 185 lb 8 oz (84.142 kg), SpO2 96.00%.  Filed Weights   03/12/12 0549 03/13/12 0649 03/14/12 0618  Weight: 186 lb 1.1 oz (84.4 kg) 184 lb 12.8 oz (83.825 kg) 185 lb 8 oz (84.142 kg)   Labs  Lab Results  Component Value Date   WBC 3.6* 03/10/2012   HGB 11.4* 03/10/2012   HCT 34.0* 03/10/2012   MCV 94.2 03/10/2012   PLT 165 03/10/2012   Lab Results  Component Value Date   CREATININE 1.51* 03/11/2012   BUN 25* 03/11/2012   NA 138 03/11/2012   K 4.0 03/11/2012   CL 103 03/11/2012   CO2 25 03/11/2012   Lab Results  Component Value Date   CKTOTAL 189 03/10/2012   CKMB 3.9 03/10/2012   TROPONINI <0.30 03/10/2012   Lab Results  Component Value Date   ALT 15 03/09/2012   AST 21 03/09/2012   ALKPHOS 48 03/09/2012   BILITOT 0.7 03/09/2012   Lab Results  Component Value Date   INR 2.82* 03/14/2012   INR 3.54* 03/13/2012   INR 3.86* 03/12/2012   Disposition  Pt is being discharged home today in good condition.  Follow-up Plans &  Appointments  Follow-up Information    Follow up with Crawford Givens, MD on 04/13/2012. (3:30 PM)    Contact information:   [address] [place] [address] [phone]       Follow up with [name], MD on 04/19/2012. (2:00 PM)    Contact information:   [address] [place] [address] [phone]       Follow up with [facility] on 03/28/2012. (08:00 AM)    Contact information:   [address]  [address] [place] [phone]        Discharge  Medications  Medication List  As of 03/14/2012  1:05 PM   TAKE these medications         atorvastatin 80 MG tablet   Commonly known as: LIPITOR   TAKE ONE TABLET BY MOUTH ONE TIME DAILY      doxazosin 8 MG tablet   Commonly known as: CARDURA   Take 1/2 tab qd      ferrous sulfate 325 (65 FE) MG tablet   Take 325 mg by mouth daily with breakfast.      furosemide 40 MG tablet   Commonly known as: LASIX   Take 1 tablet (40 mg total) by mouth daily.      memantine 10 MG tablet   Commonly known as: NAMENDA   Take 1 tablet (10 mg total) by mouth 2 (two) times daily.      nitroGLYCERIN 0.4 MG SL tablet   Commonly known as: NITROSTAT   Place 1 tablet (0.4 mg total) under the tongue every 5 (five) minutes x 3 doses as needed. For chest pain.      potassium chloride SA 20 MEQ tablet   Commonly known as: K-DUR,KLOR-CON   Take 1 tablet (20 mEq total) by mouth 2 (two) times daily.      ramipril 10 MG capsule   Commonly known as: ALTACE   Take 1 capsule (10 mg total) by mouth 2 (two) times daily.      warfarin 5 MG tablet   Commonly known as: COUMADIN   Take 5-10 mg by mouth See admin instructions. 1 tab daily except for 2 tabs daily on Mondays and Fridays.           Outstanding Labs/Studies  F/U INR @ Coumadin Clinic as scheduled  Duration of Discharge Encounter   Greater than 30 minutes including physician time.  Signed, Nicolasa Ducking NP 03/14/2012, 1:05 PM

## 2012-03-14 NOTE — Progress Notes (Signed)
  ANTICOAGULATION CONSULT NOTE - Follow Up Consult  Pharmacy Consult for Coumadin Indication: atrial fibrillation  Allergies  Allergen Reactions  . Naproxen     REACTION: ABD PAIN, DIARRHEA  . Penicillins     Patient Measurements: Height: 5\' 11"  (180.3 cm) Weight: 185 lb 8 oz (84.142 kg) IBW/kg (Calculated) : 75.3   Vital Signs: Temp: 98.4 F (36.9 C) (05/28 0618) Temp src: Oral (05/28 0618) BP: 133/74 mmHg (05/28 0623) Pulse Rate: 56  (05/28 0623)  Labs:  Alvira Philips 03/14/12 0420 03/13/12 0437 03/12/12 0407  HGB -- -- --  HCT -- -- --  PLT -- -- --  APTT -- -- --  LABPROT 30.1* 36.0* 38.5*  INR 2.82* 3.54* 3.86*  HEPARINUNFRC -- -- --  CREATININE -- -- --  CKTOTAL -- -- --  CKMB -- -- --  TROPONINI -- -- --    Estimated Creatinine Clearance: 39.5 ml/min (by C-G formula based on Cr of 1.51).   Medications:  Scheduled:     . atorvastatin  80 mg Oral q1800  . doxazosin  4 mg Oral Daily  . ferrous sulfate  325 mg Oral Q breakfast  . memantine  10 mg Oral BID  . potassium chloride SA  20 mEq Oral BID  . ramipril  10 mg Oral BID  . sodium chloride  3 mL Intravenous Q12H  . Warfarin - Pharmacist Dosing Inpatient   Does not apply q1800    Assessment: 78 yo M admit with syncope and hx of CHF, CAD, pAfib on chronic warfarin, chronic anemia, CKD stage 2-3, dementia, bradycardia, hypokalemia.  On chronic warfarin for hx of pAF. Home warfarin dose was 5mg  daily except for 10mg  daily on Mondays and Fridays.  INR now back in therapeutic range. Last dose was 10mg  given on 5/24.  Will resume warfarin dosing tonight using lower dose given recent supratherapeutic INR.   Goal of Therapy:  INR 2-3   Plan:   Warfarin 5mg  PO x1 at 18:00  Daily PT/INR  Darrol Angel, PharmD Pager: [phone] 03/14/2012 11:21 AM

## 2012-03-14 NOTE — Progress Notes (Signed)
LMTCB

## 2012-03-14 NOTE — Progress Notes (Signed)
Telephone call to the patient's son about discharge planning, [name] [phone]; [name] stated the he will be able to make arrangements for the patient at home for someone to be with him at discharge. Patient up in room ambulating hallway and frequently visiting is spouse who is a patient in the next room; telephone call to [name] (Biomedical scientist [phone]) about disposition/ arrangements for discharge. [name] will with talk to Dr Eden Emms and call CM back with an update; B Lobbyist, BSN, [place].

## 2012-03-14 NOTE — Discharge Summary (Signed)
Son able to care for father with dementia while wife still in hospital Tony Reed 3:02 PM 03/14/2012

## 2012-03-15 ENCOUNTER — Telehealth: Payer: Self-pay | Admitting: Cardiovascular Disease

## 2012-03-15 NOTE — Telephone Encounter (Signed)
FYI: pt went home today

## 2012-03-15 NOTE — Progress Notes (Signed)
Pt's son Randy notified

## 2012-03-15 NOTE — Telephone Encounter (Signed)
Spoke with pt's son,Randy about recent normal myoview.

## 2012-03-15 NOTE — Progress Notes (Signed)
Patient ID: Tony Reed, male   DOB: [birth year], 78 y.o.   MRN: [id number] Subjective:  No c/o SOB or chest pain.  No more syncope. Having some sinus bradycardia and short pauses, but no significant pauses. Dementia.  Unable to go home by himself.  Wife in next room Ambulating Apparantly needs someone at home during day and  Son does not get off until 5:00 pm  Wife having barium enema today  Objective:  Vital Signs in the last 24 hours: BP 124/73  Pulse 56  Temp(Src) 97.5 F (36.4 C) (Oral)  Resp 18  Ht 5\' 11"  (1.803 m)  Wt 86 kg (189 lb 9.5 oz)  BMI 26.44 kg/m2  SpO2 100%  Physical Exam: Elderly WM in NAD Lungs:  Clear  Cardiac:  Regular rhythm, normal S1 and S2, no S3 Abdomen:  Soft, nontender, no masses Extremities:  No edema present  Intake/Output from previous day: 05/28 0701 - 05/29 0700 In: 1200 [P.O.:1200] Out: -  Weight Filed Weights   03/13/12 0649 03/14/12 0618 03/15/12 0519  Weight: 83.825 kg (184 lb 12.8 oz) 84.142 kg (185 lb 8 oz) 86 kg (189 lb 9.5 oz)    BNP    Component Value Date/Time   PROBNP 698.4* 03/09/2012 1412    PROTIME: Lab Results  Component Value Date   INR 2.11* 03/15/2012   INR 2.82* 03/14/2012   INR 3.54* 03/13/2012    Telemetry: Sinus bradycardia with occasional pauses short none > 3 seconds  ECHO: Normal LV EF  Assessment/Plan:  1.Syncope likely due to orthostasis/NTG 2. Sinus bradycardia but not enough to warrant pacer 3. History of PAF  Rec:   Home when wife goes home. Will ask social services if they can arrange daytime assistance   Charlton Haws 8:32 AM 03/15/2012  Cardiology  03/15/2012, 8:32 AM

## 2012-03-15 NOTE — Progress Notes (Signed)
Occupational Therapy Note Chart reviewed. Pt is up in wife's room next door. Spoke to both and wife reports that patient's son will be taking him back to his house. He will have 24/7 assist/supervision. Will sign off for OT as they state not current OT needs. Judithann Sauger OTR/L [phone] 03/15/2012

## 2012-03-28 ENCOUNTER — Ambulatory Visit (INDEPENDENT_AMBULATORY_CARE_PROVIDER_SITE_OTHER): Payer: Medicare Other

## 2012-03-28 ENCOUNTER — Telehealth: Payer: Self-pay | Admitting: Physician Assistant

## 2012-03-28 DIAGNOSIS — I4891 Unspecified atrial fibrillation: Secondary | ICD-10-CM

## 2012-03-28 NOTE — Telephone Encounter (Signed)
Pt had point-of-care INR in the office today that was elevated at 6.0. He had already taken his Coumadin today. Blood was sent to [facility] labs for confirmation. This also came back elevated at 5.17. I contacted the patient and advised him to hold his Coumadin until the office contacted him. His wife stated she would tell him.

## 2012-04-04 ENCOUNTER — Ambulatory Visit (INDEPENDENT_AMBULATORY_CARE_PROVIDER_SITE_OTHER): Payer: Medicare Other | Admitting: *Deleted

## 2012-04-04 ENCOUNTER — Other Ambulatory Visit: Payer: Self-pay | Admitting: Cardiology

## 2012-04-04 DIAGNOSIS — I4891 Unspecified atrial fibrillation: Secondary | ICD-10-CM

## 2012-04-04 LAB — POCT INR: INR: 2.7

## 2012-04-05 ENCOUNTER — Other Ambulatory Visit: Payer: Self-pay | Admitting: Cardiology

## 2012-04-13 ENCOUNTER — Encounter: Payer: Self-pay | Admitting: Family Medicine

## 2012-04-13 ENCOUNTER — Ambulatory Visit (INDEPENDENT_AMBULATORY_CARE_PROVIDER_SITE_OTHER): Payer: Medicare Other | Admitting: Family Medicine

## 2012-04-13 VITALS — BP 128/66 | HR 59 | Temp 98.2°F | Wt 186.0 lb

## 2012-04-13 DIAGNOSIS — F0390 Unspecified dementia without behavioral disturbance: Secondary | ICD-10-CM

## 2012-04-13 DIAGNOSIS — R413 Other amnesia: Secondary | ICD-10-CM

## 2012-04-13 NOTE — Assessment & Plan Note (Signed)
At risk without supervision . Shouldn't drive.  Will need placement if family can't provide 24 hour care. Will get HH to see patient.  Will need help with meds, SW consult [id number].  Daughter agrees.  >25 min spent with face to face with patient, >50% counseling and/or coordinating care

## 2012-04-13 NOTE — Patient Instructions (Addendum)
Do not drive.  See Shirlee Limerick about your referral before you leave today. Call me with an update after you talk to social work.

## 2012-04-13 NOTE — Progress Notes (Signed)
Dementia. Not compliant with meds. Family struggling to care for patient.  He was able to get his keys back and started driving.  Got lost, home at 4:30 AM.  When he doesn't have a car, will wander from the home.  Arguing with family members.  Family is frustrated, daughter is here with patient today.   He doesn't have good insight into the recent events.   Prev hospital records noted.  No other episode of syncope.   PMH and SH reviewed  ROS: See HPI, otherwise noncontributory.  Meds, vitals, and allergies reviewed.   nad Not oriented to time other than year.  Doesn't know season, month, day, date.  0/3 on recall rrr ctab Motor exam grossly wnl and gait symmetric

## 2012-04-18 ENCOUNTER — Telehealth: Payer: Self-pay | Admitting: *Deleted

## 2012-04-18 NOTE — Telephone Encounter (Signed)
Annamarie Major, CMA - Professional Communication from [facility] << Less Detail     Professional Communication from [facility]      Annamarie Major, [place]       Sent: Tue April 18, 2012  2:33 PM    To: Jacqlyn Krauss, RN       EROL FLANAGIN    MRN: [id number] DOB: [birth year]    Pt Home: [phone]               Message     We received a fax on 04/16/12 from [facility] and apparently this fax was also sent to Dr. Shirlee Latch.  Dr. Para March says that he wants me to notify Cardiology concerning the patient's medication and safety issues.  The patient  apparently had missed doses of his medications including 3 doses of his Coumadin.  This was discovered when [facility] went in for the evaluation.  He is extremely confused.  He has been placed on Coumadin for a history of atrial fibrillation.  Dr. Para March would like Dr. Alford Highland input on whether to continue the Coumadin therapy.  This note was faxed on 04/16/12 to [phone].  This is the basic gist of the note.  If you did not receive the fax and would like for me to fax this to you so that you can look at it in it's entirety, please let me know.  Kindly advise [place] concerning the Coumadin.  Thank you.    04/18/12 --I will forward to Dr Shirlee Latch for review and recommendations. Pt has an appt in [facility] 04/19/12.

## 2012-04-18 NOTE — Telephone Encounter (Signed)
Forwarded to Dr. Para March as Lorain Childes.

## 2012-04-19 ENCOUNTER — Encounter: Payer: Self-pay | Admitting: Cardiology

## 2012-04-19 ENCOUNTER — Ambulatory Visit (INDEPENDENT_AMBULATORY_CARE_PROVIDER_SITE_OTHER): Payer: Medicare Other | Admitting: Pharmacist

## 2012-04-19 ENCOUNTER — Ambulatory Visit (INDEPENDENT_AMBULATORY_CARE_PROVIDER_SITE_OTHER): Payer: Medicare Other | Admitting: Cardiology

## 2012-04-19 VITALS — BP 120/58 | HR 50 | Ht 71.0 in | Wt 187.0 lb

## 2012-04-19 DIAGNOSIS — I2510 Atherosclerotic heart disease of native coronary artery without angina pectoris: Secondary | ICD-10-CM

## 2012-04-19 DIAGNOSIS — I4891 Unspecified atrial fibrillation: Secondary | ICD-10-CM

## 2012-04-19 DIAGNOSIS — I951 Orthostatic hypotension: Secondary | ICD-10-CM

## 2012-04-19 LAB — POCT INR: INR: 2.4

## 2012-04-19 NOTE — Patient Instructions (Addendum)
Stop Doxazosin  Fasting Lab Lipids and Bmet same day as your protime 05/10/12 at 10:45 am   Your physician recommends that you schedule a follow-up appointment in: 3 months

## 2012-04-21 ENCOUNTER — Other Ambulatory Visit: Payer: Self-pay | Admitting: Cardiology

## 2012-04-21 DIAGNOSIS — I951 Orthostatic hypotension: Secondary | ICD-10-CM | POA: Insufficient documentation

## 2012-04-21 NOTE — Assessment & Plan Note (Signed)
He continues to report some lightheadedness with standing though it is not particularly severe.  His son says that he never really has had much problem with BPH-type symptoms.  Doxazosin is a significant risk for orthostasis so I am going to have him stop this medication.

## 2012-04-21 NOTE — Assessment & Plan Note (Signed)
Recent negative myoview.   No chest pain lately.  Would continue coumadin, atorvastatin, ramipril.  Check lipids and BMET in 1 month, goal LDL < 70.

## 2012-04-21 NOTE — Progress Notes (Signed)
Patient ID: Tony Reed, male   DOB: [birth year], 78 y.o.   MRN: [id number] PCP: Dr. Para March  78 yo with history of CAD, diastolic CHF, and paroxysmal atrial fibrillation returns for cardiology followup.  Low resting heart rate has been chronic and he is off nodal blockade.  Exercise capacity continues to be good, he says he can walk up to a mile without exertional dyspnea.  He has had increasing memory problems.   He has been having occasional episodes of what seems like rather atypical chest pain.  He had a myoview in 5/13 that showed no evidence for ischemia or infarction.  Later that month, he apparently had another episode of chest pain.  He then took at least one and maybe more sublingual NTGs.  He passed out and was taken to the hospital.  At [facility], he was mildly bradycardic in the 50s.  No pauses or severe bradycardia noted on monitoring.  He was also noted to be orthostatic.  Echo showed preserved EF with moderate TR.    Since that hospitalization, he has had no further chest pain.  His family took away his NTG because they thought that he would sometimes take them for no reason.  One of his sons lives with him.  He is stable with no falls.  He can walk up to 3 miles to his other son's house with no problems.  He does report some continuing lightheadedness with standing.   Labs (10/11): K 3.9, creatinine 1.5, LDL 79, HDL 53 Labs (2/12): K 3.9, creatinine 1.3, BNP 267 Labs (9/12): LDL 65, HDL 50 Labs (4/13): K 3.7, creatinine 1.6 Labs (5/13): K 4, creatinine 1.5  ECG: Sinus bradycardia at 50, otherwise normal  Allergies (verified):  1)  ! Pcn 2)  Naprosyn (Naproxen)  Past Medical History: 1. Hypertension 2. CAD: Inferior MI in 2003 with BMS to RCA.  DES to CFX in 2008.  Lexiscan myoview in 5/13 showed no ischemia or infarction.  3. Osteoporosis 4. Diverticulosis, colon 5. GERD 6. Hyperlipidemia. 7. Leg pain and decreased pulses consistent with peripheral vascular  disease. 8. Paroxysmal atrial fibrillation with history of DCCV.  9. Diastolic CHF: Echo (4/11) with EF 55-60%, moderate MR, moderate to severe LAE, severe RAE, moderate to severe TR. Echo (5/13): EF 60-65%, moderate TR, PASP 43 mmHg.  L>R lower leg edema chronically.  10. Dementia 11. CKD  Family History: Father: Died unknown age Mother: Died at age 38 leukemia and HTN Siblings: One brother died at age 35 from  smoking and lung cancer. Half brother died at age 44 heart problems, smoker.  Half brother A  74 Smoker  Deaf Half Sister A 29  Social History: Marital Status: Married, lives with wife in [place].  Children: 6 Occupation: Retired Clinical research associate.   ROS: All systems reviewed and negative except as per HPI.   Current Outpatient Prescriptions  Medication Sig Dispense Refill  . atorvastatin (LIPITOR) 80 MG tablet TAKE ONE TABLET BY MOUTH ONE TIME DAILY  30 tablet  5  . ferrous sulfate 325 (65 FE) MG tablet Take 325 mg by mouth daily with breakfast.        . furosemide (LASIX) 40 MG tablet Take 1 tablet (40 mg total) by mouth daily.      . memantine (NAMENDA) 10 MG tablet Take 1 tablet (10 mg total) by mouth 2 (two) times daily.  60 tablet  5  . potassium chloride SA (K-DUR,KLOR-CON) 20 MEQ tablet TAKE ONE TABLET BY MOUTH  TWICE DAILY  60 tablet  3  . ramipril (ALTACE) 10 MG capsule Take 1 capsule (10 mg total) by mouth 2 (two) times daily.  60 capsule  6  . warfarin (COUMADIN) 5 MG tablet Take 5-10 mg by mouth See admin instructions. 1 tab daily except for 2 tabs daily on Mondays and Fridays.        BP 120/58  Pulse 50  Ht 5\' 11"  (1.803 m)  Wt 84.823 kg (187 lb)  BMI 26.08 kg/m2 General:  Elderly man in no distress.  Neck:  Neck supple, no JVD. No masses, thyromegaly or abnormal cervical nodes. Lungs:  Clear bilaterally to auscultation and percussion. Heart:  Non-displaced PMI, chest non-tender; regular rate and rhythm, S1, S2. +S4. 1/6 systolic murmur LLSB and apex.  Carotid  upstroke normal, no bruit.  1+ edema 3/4 up on left. Abdomen:  Bowel sounds positive; abdomen soft and non-tender without masses, organomegaly, or hernias noted. No hepatosplenomegaly. Extremities:  No clubbing or cyanosis. Neurologic:  Alert and oriented x 3.  Some memory difficulty.  Psych:  Normal affect.

## 2012-04-21 NOTE — Assessment & Plan Note (Signed)
H/o PAF.  NSR today.  He has been stable on his feet with no mechanical falls.  He had a syncopal episode that seems like it was due to taking one or more NTGs in the setting of dehydration.  His NTG has been taken away.  His family manages his medications.  I think he can continue warfarin for now but would stop it if he has further syncope or a fall.  No rate control medications given baseline low heart rate.

## 2012-04-28 DIAGNOSIS — F0390 Unspecified dementia without behavioral disturbance: Secondary | ICD-10-CM

## 2012-04-28 DIAGNOSIS — I10 Essential (primary) hypertension: Secondary | ICD-10-CM

## 2012-04-28 DIAGNOSIS — I509 Heart failure, unspecified: Secondary | ICD-10-CM

## 2012-04-28 DIAGNOSIS — I2510 Atherosclerotic heart disease of native coronary artery without angina pectoris: Secondary | ICD-10-CM

## 2012-05-10 ENCOUNTER — Ambulatory Visit (INDEPENDENT_AMBULATORY_CARE_PROVIDER_SITE_OTHER): Payer: Medicare Other | Admitting: *Deleted

## 2012-05-10 ENCOUNTER — Other Ambulatory Visit: Payer: Medicare Other

## 2012-05-10 ENCOUNTER — Other Ambulatory Visit (INDEPENDENT_AMBULATORY_CARE_PROVIDER_SITE_OTHER): Payer: Medicare Other

## 2012-05-10 DIAGNOSIS — I4891 Unspecified atrial fibrillation: Secondary | ICD-10-CM

## 2012-05-10 DIAGNOSIS — I2510 Atherosclerotic heart disease of native coronary artery without angina pectoris: Secondary | ICD-10-CM

## 2012-05-10 LAB — BASIC METABOLIC PANEL
GFR: 50.13 mL/min — ABNORMAL LOW (ref >60.00)
Potassium: 4.2 mEq/L (ref 3.5–5.1)
Sodium: 139 mEq/L (ref 135–145)

## 2012-05-10 LAB — LIPID PANEL
Cholesterol: 149 mg/dL (ref 0–200)
HDL: 54.3 mg/dL (ref >39.00)
Triglycerides: 114 mg/dL (ref 0.0–149.0)
VLDL: 22.8 mg/dL (ref 0.0–40.0)

## 2012-05-12 ENCOUNTER — Telehealth: Payer: Self-pay | Admitting: Cardiology

## 2012-05-12 NOTE — Telephone Encounter (Signed)
Spoke with Randy

## 2012-05-12 NOTE — Telephone Encounter (Signed)
New problem:  Patient wife returning call back to nurse.

## 2012-05-19 ENCOUNTER — Ambulatory Visit (INDEPENDENT_AMBULATORY_CARE_PROVIDER_SITE_OTHER): Payer: Medicare Other | Admitting: *Deleted

## 2012-05-19 DIAGNOSIS — I4891 Unspecified atrial fibrillation: Secondary | ICD-10-CM

## 2012-06-02 ENCOUNTER — Ambulatory Visit (INDEPENDENT_AMBULATORY_CARE_PROVIDER_SITE_OTHER): Payer: Medicare Other | Admitting: Pharmacist

## 2012-06-02 DIAGNOSIS — I4891 Unspecified atrial fibrillation: Secondary | ICD-10-CM

## 2012-06-16 ENCOUNTER — Ambulatory Visit (INDEPENDENT_AMBULATORY_CARE_PROVIDER_SITE_OTHER): Payer: Medicare Other | Admitting: *Deleted

## 2012-06-16 DIAGNOSIS — I4891 Unspecified atrial fibrillation: Secondary | ICD-10-CM

## 2012-06-16 LAB — PROTIME-INR: INR: 7.9 ratio (ref 0.8–1.0)

## 2012-06-16 NOTE — Patient Instructions (Signed)
Spoke with patient's wife and gave instructions to hold coumadin until pt is seen in coumadin clinic on Tuesday 9-3.

## 2012-06-20 ENCOUNTER — Ambulatory Visit (INDEPENDENT_AMBULATORY_CARE_PROVIDER_SITE_OTHER): Payer: Medicare Other | Admitting: Pharmacist

## 2012-06-20 DIAGNOSIS — I4891 Unspecified atrial fibrillation: Secondary | ICD-10-CM

## 2012-06-20 LAB — POCT INR: INR: 2.4

## 2012-06-27 ENCOUNTER — Ambulatory Visit (INDEPENDENT_AMBULATORY_CARE_PROVIDER_SITE_OTHER): Payer: Medicare Other | Admitting: *Deleted

## 2012-06-27 DIAGNOSIS — I4891 Unspecified atrial fibrillation: Secondary | ICD-10-CM

## 2012-07-02 ENCOUNTER — Other Ambulatory Visit: Payer: Self-pay | Admitting: Cardiology

## 2012-07-11 ENCOUNTER — Ambulatory Visit (INDEPENDENT_AMBULATORY_CARE_PROVIDER_SITE_OTHER): Payer: Medicare Other | Admitting: *Deleted

## 2012-07-11 DIAGNOSIS — I4891 Unspecified atrial fibrillation: Secondary | ICD-10-CM

## 2012-07-11 LAB — POCT INR: INR: 2.3

## 2012-07-24 ENCOUNTER — Ambulatory Visit (INDEPENDENT_AMBULATORY_CARE_PROVIDER_SITE_OTHER): Payer: Medicare Other | Admitting: Cardiology

## 2012-07-24 ENCOUNTER — Encounter: Payer: Self-pay | Admitting: Cardiology

## 2012-07-24 ENCOUNTER — Ambulatory Visit (INDEPENDENT_AMBULATORY_CARE_PROVIDER_SITE_OTHER): Payer: Medicare Other

## 2012-07-24 VITALS — BP 122/66 | HR 47 | Ht 71.0 in | Wt 184.0 lb

## 2012-07-24 DIAGNOSIS — I951 Orthostatic hypotension: Secondary | ICD-10-CM

## 2012-07-24 DIAGNOSIS — I4891 Unspecified atrial fibrillation: Secondary | ICD-10-CM

## 2012-07-24 DIAGNOSIS — I2510 Atherosclerotic heart disease of native coronary artery without angina pectoris: Secondary | ICD-10-CM

## 2012-07-24 LAB — POCT INR: INR: 4.1

## 2012-07-24 NOTE — Patient Instructions (Addendum)
Your physician recommends that you schedule a follow-up appointment in: 6 months with Dr Shirlee Latch.  Your physician recommends that you return for a FASTING lipid profile /liver profile in 6 months when you see Dr Shirlee Latch

## 2012-07-24 NOTE — Progress Notes (Signed)
Patient ID: Tony Reed, male   DOB: 1925-11-02, 78 y.o.   MRN: [id number] PCP: Dr. Para March  78 yo with history of CAD, diastolic CHF, and paroxysmal atrial fibrillation returns for cardiology followup.  Low resting heart rate has been chronic and he is off nodal blockade.  No lightheadedness or syncope.  No falls.  No tachypalpitations.  Exercise capacity continues to be good, he says he can walk up to a mile without exertional dyspnea.  He has had increasing memory problems.  No further episodes of chest pain.   Labs (10/11): K 3.9, creatinine 1.5, LDL 79, HDL 53 Labs (2/12): K 3.9, creatinine 1.3, BNP 267 Labs (9/12): LDL 65, HDL 50 Labs (4/13): K 3.7, creatinine 1.6 Labs (5/13): K 4, creatinine 1.5 Labs (7/13): K 4.2, creatinine 1.4, LDL 72, HDL 54  Allergies (verified):  1)  ! Pcn 2)  Naprosyn (Naproxen)  Past Medical History: 1. Hypertension 2. CAD: Inferior MI in 2003 with BMS to RCA.  DES to CFX in 2008.  Lexiscan myoview in 5/13 showed no ischemia or infarction.  3. Osteoporosis 4. Diverticulosis, colon 5. GERD 6. Hyperlipidemia. 7. Leg pain and decreased pulses consistent with peripheral vascular disease. 8. Paroxysmal atrial fibrillation with history of DCCV.  9. Diastolic CHF: Echo (4/11) with EF 55-60%, moderate MR, moderate to severe LAE, severe RAE, moderate to severe TR. Echo (5/13): EF 60-65%, moderate TR, PASP 43 mmHg.  L>R lower leg edema chronically.  10. Dementia 11. CKD  Family History: Father: Died unknown age Mother: Died at age 53 leukemia and HTN Siblings: One brother died at age 76 from  smoking and lung cancer. Half brother died at age 33 heart problems, smoker.  Half brother A  58 Smoker  Deaf Half Sister A 51  Social History: Marital Status: Married, lives with wife in [place].  Children: 6 Occupation: Retired Clinical research associate.   ROS: All systems reviewed and negative except as per HPI.   Current Outpatient Prescriptions  Medication Sig  Dispense Refill  . atorvastatin (LIPITOR) 80 MG tablet TAKE ONE TABLET BY MOUTH ONE TIME DAILY  30 tablet  0  . ferrous sulfate 325 (65 FE) MG tablet Take 325 mg by mouth daily with breakfast.        . furosemide (LASIX) 40 MG tablet Take 1 tablet (40 mg total) by mouth daily.      . memantine (NAMENDA) 10 MG tablet Take 1 tablet (10 mg total) by mouth 2 (two) times daily.  60 tablet  5  . potassium chloride SA (K-DUR,KLOR-CON) 20 MEQ tablet TAKE ONE TABLET BY MOUTH TWICE DAILY  60 tablet  3  . ramipril (ALTACE) 10 MG capsule Take 1 capsule (10 mg total) by mouth 2 (two) times daily.  60 capsule  6  . warfarin (COUMADIN) 5 MG tablet Take 5-10 mg by mouth See admin instructions. 1 tab daily except for 2 tabs daily on Mondays and Fridays.      Marland Kitchen warfarin (COUMADIN) 5 MG tablet TAKE AS DIRECTED BY ANTICOAGULATION CLINIC  60 tablet  3    BP 122/66  Pulse 47  Ht 5\' 11"  (1.803 m)  Wt 184 lb (83.462 kg)  BMI 25.66 kg/m2 General:  Elderly man in no distress.  Neck:  Neck supple, no JVD. No masses, thyromegaly or abnormal cervical nodes. Lungs:  Clear bilaterally to auscultation and percussion. Heart:  Non-displaced PMI, chest non-tender; regular rate and rhythm, S1, S2. +S4. 1/6 systolic murmur LLSB and apex.  Carotid upstroke normal, no bruit.  Trace left ankle edema. Abdomen:  Bowel sounds positive; abdomen soft and non-tender without masses, organomegaly, or hernias noted. No hepatosplenomegaly. Extremities:  No clubbing or cyanosis. Neurologic:  Alert and oriented x 3.  Some memory difficulty.  Psych:  Normal affect.  Assessment/Plan:  Atrial fibrillation H/o PAF. NSR today. He has been stable on his feet with no mechanical falls.  I think he can continue warfarin for now but would stop it if he has a fall. No rate control medications given baseline low heart rate.  CAD, NATIVE VESSEL  Recent negative myoview. No chest pain lately. Would continue coumadin, atorvastatin, ramipril.    Orthostasis  I stopped his doxazosin at last appointment.  He has not had any orthostatic symptoms recently.   [name] 07/24/2012

## 2012-08-07 ENCOUNTER — Ambulatory Visit (INDEPENDENT_AMBULATORY_CARE_PROVIDER_SITE_OTHER): Payer: Medicare Other | Admitting: *Deleted

## 2012-08-07 DIAGNOSIS — I4891 Unspecified atrial fibrillation: Secondary | ICD-10-CM

## 2012-08-10 ENCOUNTER — Encounter: Payer: Self-pay | Admitting: Family Medicine

## 2012-08-10 ENCOUNTER — Ambulatory Visit (INDEPENDENT_AMBULATORY_CARE_PROVIDER_SITE_OTHER): Payer: Medicare Other | Admitting: Family Medicine

## 2012-08-10 VITALS — BP 142/64 | HR 79 | Temp 97.9°F | Wt 186.0 lb

## 2012-08-10 DIAGNOSIS — R413 Other amnesia: Secondary | ICD-10-CM

## 2012-08-10 NOTE — Assessment & Plan Note (Signed)
Don't allow driving or unsupervised walking out of the home.  Family meeting needed- need to discuss options for 24/7 supervision at home. If not supervised, he is highly likely to have an accident or other trouble.  He needs supervision at home- oven, stove, tools, etc.  If family can't provide coverage/supervision at home, then he may need day care services during the day (ask home health about this) or full time placement in a facility.  Would continue current meds.  >25 min spent with face to face with patient, >50% counseling and/or coordinating care
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
## 2012-08-10 NOTE — Patient Instructions (Addendum)
Don't allow driving or unsupervised walking out of the home.  Family meeting- need to discuss options for 24/7 supervision at home.  If not supervised, he is highly likely to have an accident or other trouble.   He needs supervision at home- oven, stove, tools, etc.  If you can't provide coverage/supervision at home, then he may need day care services during the day (ask home health about this) or full time placement in a facility.

## 2012-08-15 ENCOUNTER — Other Ambulatory Visit: Payer: Self-pay | Admitting: *Deleted

## 2012-08-15 MED ORDER — ATORVASTATIN CALCIUM 80 MG PO TABS: 80.0000 mg | ORAL_TABLET | Freq: Every day | ORAL | Status: DC

## 2012-09-04 ENCOUNTER — Ambulatory Visit (INDEPENDENT_AMBULATORY_CARE_PROVIDER_SITE_OTHER): Payer: Medicare Other | Admitting: *Deleted

## 2012-09-04 DIAGNOSIS — I4891 Unspecified atrial fibrillation: Secondary | ICD-10-CM

## 2012-09-12 ENCOUNTER — Ambulatory Visit (INDEPENDENT_AMBULATORY_CARE_PROVIDER_SITE_OTHER): Payer: Medicare Other | Admitting: *Deleted

## 2012-09-12 DIAGNOSIS — I4891 Unspecified atrial fibrillation: Secondary | ICD-10-CM

## 2012-09-19 ENCOUNTER — Other Ambulatory Visit: Payer: Self-pay | Admitting: Family Medicine

## 2012-09-26 ENCOUNTER — Ambulatory Visit (INDEPENDENT_AMBULATORY_CARE_PROVIDER_SITE_OTHER): Payer: Medicare Other | Admitting: *Deleted

## 2012-09-26 DIAGNOSIS — I4891 Unspecified atrial fibrillation: Secondary | ICD-10-CM

## 2012-09-26 LAB — POCT INR: INR: 3.4

## 2012-10-04 ENCOUNTER — Other Ambulatory Visit: Payer: Self-pay | Admitting: Cardiology

## 2012-10-10 ENCOUNTER — Ambulatory Visit (INDEPENDENT_AMBULATORY_CARE_PROVIDER_SITE_OTHER): Payer: Medicare Other | Admitting: *Deleted

## 2012-10-10 DIAGNOSIS — I4891 Unspecified atrial fibrillation: Secondary | ICD-10-CM

## 2012-10-25 ENCOUNTER — Ambulatory Visit (INDEPENDENT_AMBULATORY_CARE_PROVIDER_SITE_OTHER): Payer: Medicare Other | Admitting: Pharmacist

## 2012-10-25 DIAGNOSIS — I4891 Unspecified atrial fibrillation: Secondary | ICD-10-CM

## 2012-11-08 ENCOUNTER — Ambulatory Visit (INDEPENDENT_AMBULATORY_CARE_PROVIDER_SITE_OTHER): Payer: Medicare Other | Admitting: *Deleted

## 2012-11-08 DIAGNOSIS — I4891 Unspecified atrial fibrillation: Secondary | ICD-10-CM

## 2012-11-08 LAB — POCT INR: INR: 3.5

## 2012-11-10 ENCOUNTER — Other Ambulatory Visit: Payer: Self-pay | Admitting: *Deleted

## 2012-11-10 MED ORDER — ATORVASTATIN CALCIUM 80 MG PO TABS: 80.0000 mg | ORAL_TABLET | Freq: Every day | ORAL | Status: DC

## 2012-11-10 MED ORDER — POTASSIUM CHLORIDE CRYS ER 20 MEQ PO TBCR: 20.0000 meq | EXTENDED_RELEASE_TABLET | Freq: Two times a day (BID) | ORAL | Status: DC

## 2012-11-10 MED ORDER — RAMIPRIL 10 MG PO CAPS: 10.0000 mg | ORAL_CAPSULE | Freq: Two times a day (BID) | ORAL | Status: DC

## 2012-11-22 ENCOUNTER — Ambulatory Visit (INDEPENDENT_AMBULATORY_CARE_PROVIDER_SITE_OTHER): Payer: Medicare Other | Admitting: *Deleted

## 2012-11-22 DIAGNOSIS — I4891 Unspecified atrial fibrillation: Secondary | ICD-10-CM

## 2012-11-22 LAB — POCT INR: INR: 2.2

## 2012-12-11 ENCOUNTER — Other Ambulatory Visit: Payer: Self-pay | Admitting: Family Medicine

## 2012-12-13 ENCOUNTER — Ambulatory Visit (INDEPENDENT_AMBULATORY_CARE_PROVIDER_SITE_OTHER): Payer: Medicare Other | Admitting: *Deleted

## 2012-12-13 DIAGNOSIS — I4891 Unspecified atrial fibrillation: Secondary | ICD-10-CM

## 2012-12-27 ENCOUNTER — Ambulatory Visit (INDEPENDENT_AMBULATORY_CARE_PROVIDER_SITE_OTHER): Payer: Medicare Other | Admitting: *Deleted

## 2012-12-27 LAB — POCT INR: INR: 2.6

## 2013-01-08 ENCOUNTER — Other Ambulatory Visit: Payer: Self-pay | Admitting: Cardiology

## 2013-01-10 ENCOUNTER — Ambulatory Visit (INDEPENDENT_AMBULATORY_CARE_PROVIDER_SITE_OTHER): Payer: Medicare Other | Admitting: *Deleted

## 2013-01-10 DIAGNOSIS — I4891 Unspecified atrial fibrillation: Secondary | ICD-10-CM

## 2013-01-18 ENCOUNTER — Encounter: Payer: Self-pay | Admitting: Family Medicine

## 2013-01-18 ENCOUNTER — Ambulatory Visit (INDEPENDENT_AMBULATORY_CARE_PROVIDER_SITE_OTHER): Payer: Medicare Other | Admitting: Family Medicine

## 2013-01-18 VITALS — BP 112/58 | HR 56 | Temp 98.7°F | Wt 191.5 lb

## 2013-01-18 DIAGNOSIS — R350 Frequency of micturition: Secondary | ICD-10-CM

## 2013-01-18 DIAGNOSIS — N390 Urinary tract infection, site not specified: Secondary | ICD-10-CM

## 2013-01-18 LAB — POCT URINALYSIS DIPSTICK
Bilirubin, UA: NEGATIVE
Ketones, UA: NEGATIVE
Protein, UA: 300
Spec Grav, UA: 1.01
pH, UA: 5

## 2013-01-18 MED ORDER — SULFAMETHOXAZOLE-TRIMETHOPRIM 800-160 MG PO TABS: 1.0000 | ORAL_TABLET | Freq: Two times a day (BID) | ORAL | Status: DC

## 2013-01-18 NOTE — Patient Instructions (Addendum)
Check your lasix dose and let us know if you are not taking it as we have it listed.  Cut the coumadin back to 2.5 mg a day while on the antibiotics.   Start the antibiotics today.   Take care. Keep the appointment with cardiology.

## 2013-01-18 NOTE — Progress Notes (Signed)
Pt denies any complaints currently.  Family reported prev burning with urination.  No other sig changes known to family- memory loss at baseline.  No fevers.  No vomiting, no diarrhea. No change in appetite.   R temple with dark SK noted.   Meds, vitals, and allergies reviewed.   ROS: See HPI.  Otherwise, noncontributory.  nad Not oriented Pleasant in conversation rrr ctab abd soft, not ttp No CVA pain

## 2013-01-19 DIAGNOSIS — N390 Urinary tract infection, site not specified: Secondary | ICD-10-CM | POA: Insufficient documentation

## 2013-01-19 NOTE — Assessment & Plan Note (Signed)
Presumed, start septra and adjust coumadin to 2.5 mg a day in the meantime cards f/u planned next week.  Family agrees. Okay for outpatient f/u.  Check ucx.

## 2013-01-20 LAB — URINE CULTURE: Colony Count: >=100000

## 2013-01-22 ENCOUNTER — Ambulatory Visit: Payer: Medicare Other | Admitting: Family Medicine

## 2013-01-22 ENCOUNTER — Encounter: Payer: Self-pay | Admitting: Cardiology

## 2013-01-22 ENCOUNTER — Ambulatory Visit (INDEPENDENT_AMBULATORY_CARE_PROVIDER_SITE_OTHER): Payer: Medicare Other | Admitting: Cardiology

## 2013-01-22 VITALS — BP 122/56 | HR 54 | Ht 71.0 in | Wt 190.0 lb

## 2013-01-22 DIAGNOSIS — I2510 Atherosclerotic heart disease of native coronary artery without angina pectoris: Secondary | ICD-10-CM

## 2013-01-22 DIAGNOSIS — I4891 Unspecified atrial fibrillation: Secondary | ICD-10-CM

## 2013-01-22 DIAGNOSIS — E785 Hyperlipidemia, unspecified: Secondary | ICD-10-CM

## 2013-01-22 LAB — LIPID PANEL
Cholesterol: 124 mg/dL (ref 0–200)
HDL: 39.4 mg/dL (ref >39.00)
LDL Cholesterol: 64 mg/dL (ref 0–99)
VLDL: 21 mg/dL (ref 0.0–40.0)

## 2013-01-22 LAB — BASIC METABOLIC PANEL
BUN: 34 mg/dL — ABNORMAL HIGH (ref 6–23)
Chloride: 101 mEq/L (ref 96–112)
Glucose, Bld: 86 mg/dL (ref 70–99)
Potassium: 4.1 mEq/L (ref 3.5–5.1)
Sodium: 137 mEq/L (ref 135–145)

## 2013-01-22 NOTE — Progress Notes (Signed)
Patient ID: Tony Reed, male   DOB: 1926/08/03, 79 y.o.   MRN: [id number] PCP: Dr. Para March  79 yo with history of CAD, diastolic CHF, and paroxysmal atrial fibrillation returns for cardiology followup.  Low resting heart rate has been chronic and he is off nodal blockade.  No lightheadedness or syncope.  No falls.  No tachypalpitations.  No chest pain.  No exertional dypsnea; he walks up to a mile at a time.  No overt GI bleeding on warfarin.  He has trouble with his memory.   Labs (10/11): K 3.9, creatinine 1.5, LDL 79, HDL 53 Labs (2/12): K 3.9, creatinine 1.3, BNP 267 Labs (9/12): LDL 65, HDL 50 Labs (4/13): K 3.7, creatinine 1.6 Labs (5/13): K 4, creatinine 1.5 Labs (7/13): K 4.2, creatinine 1.4, LDL 72, HDL 54  ECG: NSR at 54, normal  Allergies (verified):  1)  ! Pcn 2)  Naprosyn (Naproxen)  Past Medical History: 1. Hypertension 2. CAD: Inferior MI in 2003 with BMS to RCA.  DES to CFX in 2008.  Lexiscan myoview in 5/13 showed no ischemia or infarction.  3. Osteoporosis 4. Diverticulosis, colon 5. GERD 6. Hyperlipidemia. 7. Leg pain and decreased pulses consistent with peripheral vascular disease. 8. Paroxysmal atrial fibrillation with history of DCCV.  9. Diastolic CHF: Echo (4/11) with EF 55-60%, moderate MR, moderate to severe LAE, severe RAE, moderate to severe TR. Echo (5/13): EF 60-65%, moderate TR, PASP 43 mmHg.  L>R lower leg edema chronically.  10. Dementia 11. CKD  Family History: Father: Died unknown age Mother: Died at age 62 leukemia and HTN Siblings: One brother died at age 66 from  smoking and lung cancer. Half brother died at age 35 heart problems, smoker.  Half brother A  78 Smoker  Deaf Half Sister A 50  Social History: Marital Status: Married, lives with wife in [place].  Children: 6 Occupation: Retired Clinical research associate.   ROS: All systems reviewed and negative except as per HPI.   Current Outpatient Prescriptions  Medication Sig Dispense Refill   . atorvastatin (LIPITOR) 80 MG tablet Take 1 tablet (80 mg total) by mouth daily.  90 tablet  1  . ferrous sulfate 325 (65 FE) MG tablet Take 325 mg by mouth daily with breakfast.        . furosemide (LASIX) 40 MG tablet TAKE 2 TABLETS BY MOUTH IN THE MORNING AND TAKE 1 TABLET BY MOUTH IN THE EVENING  90 tablet  5  . NAMENDA 10 MG tablet TAKE ONE TABLET BY MOUTH TWICE DAILY  60 tablet  5  . potassium chloride SA (K-DUR,KLOR-CON) 20 MEQ tablet Take 1 tablet (20 mEq total) by mouth 2 (two) times daily.  180 tablet  1  . ramipril (ALTACE) 10 MG capsule Take 1 capsule (10 mg total) by mouth 2 (two) times daily.  180 capsule  1  . sulfamethoxazole-trimethoprim (BACTRIM DS,SEPTRA DS) 800-160 MG per tablet Take 1 tablet by mouth 2 (two) times daily.  10 tablet  0  . warfarin (COUMADIN) 5 MG tablet Take 5-10 mg by mouth See admin instructions. 1 tab daily except for 2 tabs daily on Mondays and Fridays.      Marland Kitchen warfarin (COUMADIN) 5 MG tablet TAKE AS DIRECTED BY ANTICOAGULATION CLINIC  60 tablet  1   No current facility-administered medications for this visit.    BP 122/56  Pulse 54  Ht 5\' 11"  (1.803 m)  Wt 190 lb (86.183 kg)  BMI 26.51 kg/m2 General:  Elderly  man in no distress.  Neck:  Neck supple, no JVD. No masses, thyromegaly or abnormal cervical nodes. Lungs:  Clear bilaterally to auscultation and percussion. Heart:  Non-displaced PMI, chest non-tender; regular rate and rhythm, S1, S2. +S4. 1/6 systolic murmur LLSB and apex.  Carotid upstroke normal, no bruit.  1+ bilateral ankle edema. Abdomen:  Bowel sounds positive; abdomen soft and non-tender without masses, organomegaly, or hernias noted. No hepatosplenomegaly. Extremities:  No clubbing or cyanosis. Neurologic:  Alert and oriented x 3.  Some memory difficulty.  Psych:  Normal affect.  Assessment/Plan:  Atrial fibrillation H/o PAF. NSR today. He has been stable on his feet with no mechanical falls. Continue warfarin. No rate control  medications given baseline low heart rate.  CAD, NATIVE VESSEL  Negative myoview in 5/13. No chest pain lately. Would continue coumadin, atorvastatin, ramipril.  Hyperlipidemia Continue atorvastatin.  Will check lipids today.   [name] 01/22/2013

## 2013-01-22 NOTE — Patient Instructions (Addendum)
Your physician recommends that you return for lab work in: today (bnp, lipid, bmet)  Your physician wants you to follow-up in: 6 months.   You will receive a reminder letter in the mail two months in advance. If you don't receive a letter, please call our office to schedule the follow-up appointment.

## 2013-01-23 ENCOUNTER — Telehealth: Payer: Self-pay

## 2013-01-23 ENCOUNTER — Telehealth: Payer: Self-pay | Admitting: *Deleted

## 2013-01-23 DIAGNOSIS — N289 Disorder of kidney and ureter, unspecified: Secondary | ICD-10-CM

## 2013-01-23 MED ORDER — RAMIPRIL 10 MG PO CAPS: 10.0000 mg | ORAL_CAPSULE | Freq: Every day | ORAL | Status: DC

## 2013-01-23 MED ORDER — FUROSEMIDE 40 MG PO TABS: 80.0000 mg | ORAL_TABLET | Freq: Every day | ORAL | Status: DC

## 2013-01-23 NOTE — Telephone Encounter (Signed)
Pt has 6 days left on ABX, medication reduction with verbal verification repeated by wife and 1 week bmet app set. MAR was adjusted. Dr Lianne Bushy office was contacted and Artelia Laroche will get msg to DR to see if bactrim can be switched, they will contact pt with any med change.

## 2013-01-23 NOTE — Telephone Encounter (Signed)
Message copied by Antony Odea on Tue Jan 23, 2013  9:36 AM ------      Message from: Laurey Morale      Created: Tue Jan 23, 2013 12:33 AM       Creatinine is elevated, ? Related to Bactrim use.  Would see how much longer he is supposed to take Bactrim => if more than a day or so longer, would have him talk to his PCP for a different antibiotic.  For now, would decrease ramipril to once a day and stop the pm Lasix dose.  Repeat BMET in 1 week. ------

## 2013-01-23 NOTE — Telephone Encounter (Signed)
I wouldn't have expected this from the abx.  I would stop the antibiotics totally at this point as his symptoms were resolved.  If symptoms return, then have them notify us.  His family said he was taking in less fluid, so this may be from relative dec in fluid intake.  I saw the f/u BMET ordered and I appreciate cards input.  I adjusted the med list.  Thanks.

## 2013-01-23 NOTE — Telephone Encounter (Signed)
Wife advised. 

## 2013-01-23 NOTE — Telephone Encounter (Signed)
Jodette with Dr Alford Highland office called; BUN and creatinine elevated with no med change other than pt taking Bactrim. Dr Shirlee Latch wanted to know how much longer pt will be on Bactrim and request antibiotic changed from Bactrim to different antibiotic. Dr Shirlee Latch will adjust some of pt's meds and recheck labs in one week (see 01/23/13 phone note in cardiology). Pt expecting phone call from Dr Lianne Bushy office.Please advise.

## 2013-01-31 ENCOUNTER — Ambulatory Visit (INDEPENDENT_AMBULATORY_CARE_PROVIDER_SITE_OTHER): Payer: Medicare Other | Admitting: Pharmacist

## 2013-01-31 ENCOUNTER — Other Ambulatory Visit (INDEPENDENT_AMBULATORY_CARE_PROVIDER_SITE_OTHER): Payer: Medicare Other

## 2013-01-31 DIAGNOSIS — I4891 Unspecified atrial fibrillation: Secondary | ICD-10-CM

## 2013-01-31 DIAGNOSIS — I2510 Atherosclerotic heart disease of native coronary artery without angina pectoris: Secondary | ICD-10-CM

## 2013-01-31 DIAGNOSIS — N289 Disorder of kidney and ureter, unspecified: Secondary | ICD-10-CM

## 2013-01-31 LAB — BASIC METABOLIC PANEL
BUN: 20 mg/dL (ref 6–23)
Creatinine, Ser: 1.4 mg/dL (ref 0.4–1.5)
GFR: 52.57 mL/min — ABNORMAL LOW (ref >60.00)

## 2013-01-31 LAB — POCT INR: INR: 1.6

## 2013-01-31 LAB — HEPATIC FUNCTION PANEL
ALT: 17 U/L (ref 0–53)
Alkaline Phosphatase: 66 U/L (ref 39–117)
Bilirubin, Direct: 0.1 mg/dL (ref 0.0–0.3)
Total Protein: 7.3 g/dL (ref 6.0–8.3)

## 2013-01-31 LAB — LIPID PANEL: Cholesterol: 118 mg/dL (ref 0–200)

## 2013-02-14 ENCOUNTER — Ambulatory Visit (INDEPENDENT_AMBULATORY_CARE_PROVIDER_SITE_OTHER): Payer: Medicare Other | Admitting: *Deleted

## 2013-02-14 DIAGNOSIS — I4891 Unspecified atrial fibrillation: Secondary | ICD-10-CM

## 2013-02-14 LAB — POCT INR: INR: 1.8

## 2013-02-28 ENCOUNTER — Ambulatory Visit (INDEPENDENT_AMBULATORY_CARE_PROVIDER_SITE_OTHER): Payer: Medicare Other | Admitting: *Deleted

## 2013-02-28 DIAGNOSIS — I4891 Unspecified atrial fibrillation: Secondary | ICD-10-CM

## 2013-03-21 ENCOUNTER — Ambulatory Visit (INDEPENDENT_AMBULATORY_CARE_PROVIDER_SITE_OTHER): Payer: Medicare Other | Admitting: *Deleted

## 2013-03-21 DIAGNOSIS — I4891 Unspecified atrial fibrillation: Secondary | ICD-10-CM

## 2013-04-10 ENCOUNTER — Encounter: Payer: Self-pay | Admitting: *Deleted

## 2013-04-10 ENCOUNTER — Other Ambulatory Visit: Payer: Self-pay | Admitting: Family Medicine

## 2013-04-10 ENCOUNTER — Other Ambulatory Visit: Payer: Self-pay | Admitting: Cardiology

## 2013-04-18 ENCOUNTER — Ambulatory Visit (INDEPENDENT_AMBULATORY_CARE_PROVIDER_SITE_OTHER): Payer: Medicare Other | Admitting: *Deleted

## 2013-04-18 DIAGNOSIS — I4891 Unspecified atrial fibrillation: Secondary | ICD-10-CM

## 2013-05-03 ENCOUNTER — Ambulatory Visit (INDEPENDENT_AMBULATORY_CARE_PROVIDER_SITE_OTHER): Payer: Medicare Other | Admitting: *Deleted

## 2013-05-03 DIAGNOSIS — I4891 Unspecified atrial fibrillation: Secondary | ICD-10-CM

## 2013-05-21 ENCOUNTER — Ambulatory Visit (INDEPENDENT_AMBULATORY_CARE_PROVIDER_SITE_OTHER): Payer: Medicare Other | Admitting: *Deleted

## 2013-05-21 DIAGNOSIS — I4891 Unspecified atrial fibrillation: Secondary | ICD-10-CM

## 2013-05-21 LAB — POCT INR: INR: 4

## 2013-05-29 ENCOUNTER — Other Ambulatory Visit: Payer: Self-pay | Admitting: *Deleted

## 2013-05-29 ENCOUNTER — Other Ambulatory Visit: Payer: Self-pay | Admitting: Cardiology

## 2013-06-04 ENCOUNTER — Ambulatory Visit (INDEPENDENT_AMBULATORY_CARE_PROVIDER_SITE_OTHER): Payer: Medicare Other | Admitting: Pharmacist

## 2013-06-04 DIAGNOSIS — I4891 Unspecified atrial fibrillation: Secondary | ICD-10-CM

## 2013-06-04 LAB — POCT INR: INR: 2.4

## 2013-06-25 ENCOUNTER — Ambulatory Visit (INDEPENDENT_AMBULATORY_CARE_PROVIDER_SITE_OTHER): Payer: Medicare Other | Admitting: *Deleted

## 2013-06-25 DIAGNOSIS — I4891 Unspecified atrial fibrillation: Secondary | ICD-10-CM

## 2013-07-25 ENCOUNTER — Encounter: Payer: Self-pay | Admitting: Cardiology

## 2013-07-25 ENCOUNTER — Ambulatory Visit (INDEPENDENT_AMBULATORY_CARE_PROVIDER_SITE_OTHER): Payer: Medicare Other | Admitting: Cardiology

## 2013-07-25 ENCOUNTER — Ambulatory Visit (INDEPENDENT_AMBULATORY_CARE_PROVIDER_SITE_OTHER): Payer: Medicare Other | Admitting: *Deleted

## 2013-07-25 ENCOUNTER — Encounter (INDEPENDENT_AMBULATORY_CARE_PROVIDER_SITE_OTHER): Payer: Self-pay

## 2013-07-25 VITALS — BP 160/78 | HR 59 | Ht 71.0 in | Wt 202.8 lb

## 2013-07-25 DIAGNOSIS — I4891 Unspecified atrial fibrillation: Secondary | ICD-10-CM

## 2013-07-25 DIAGNOSIS — I5032 Chronic diastolic (congestive) heart failure: Secondary | ICD-10-CM

## 2013-07-25 DIAGNOSIS — E785 Hyperlipidemia, unspecified: Secondary | ICD-10-CM

## 2013-07-25 DIAGNOSIS — I2510 Atherosclerotic heart disease of native coronary artery without angina pectoris: Secondary | ICD-10-CM

## 2013-07-25 LAB — BASIC METABOLIC PANEL
CO2: 30 mEq/L (ref 19–32)
Chloride: 102 mEq/L (ref 96–112)
Glucose, Bld: 98 mg/dL (ref 70–99)
Sodium: 141 mEq/L (ref 135–145)

## 2013-07-25 LAB — POCT INR: INR: 3.1

## 2013-07-25 MED ORDER — AMLODIPINE BESYLATE 2.5 MG PO TABS: 2.5000 mg | ORAL_TABLET | Freq: Every day | ORAL | Status: DC

## 2013-07-25 MED ORDER — WARFARIN SODIUM 5 MG PO TABS: ORAL_TABLET | ORAL | Status: DC

## 2013-07-25 NOTE — Patient Instructions (Signed)
Start amlodipine 2.5mg  daily.   Your physician recommends that you have  lab work today--BMET/BNP  Your physician has requested that you regularly monitor and record your blood pressure readings at home. Please use the same machine at the same time of day to check your readings and record them. I will call you in 2 weeks to get the readings. Luana Shu  Your physician wants you to follow-up in: 6 months with Dr Shirlee Latch. (April 2015).  You will receive a reminder letter in the mail two months in advance. If you don't receive a letter, please call our office to schedule the follow-up appointment.   Your physician recommends that you return for a FASTING lipid profile: in 6 months when you see Dr Shirlee Latch.

## 2013-07-26 NOTE — Progress Notes (Signed)
Patient ID: Tony Reed, male   DOB: [birth year], 79 y.o.   MRN: [id number] PCP: Dr. Para March  79 yo with history of CAD, diastolic CHF, and paroxysmal atrial fibrillation returns for cardiology followup.  Low resting heart rate has been chronic and he is off nodal blockade.  No lightheadedness or syncope.  No falls.  No tachypalpitations.  No chest pain.  No exertional dypsnea; he walks up to a mile at a time.  No overt GI bleeding on warfarin.  He has trouble with his memory.  Stable lower extremity edema. BP running high.   Labs (10/11): K 3.9, creatinine 1.5, LDL 79, HDL 53 Labs (2/12): K 3.9, creatinine 1.3, BNP 267 Labs (9/12): LDL 65, HDL 50 Labs (4/13): K 3.7, creatinine 1.6 Labs (5/13): K 4, creatinine 1.5 Labs (7/13): K 4.2, creatinine 1.4, LDL 72, HDL 54 Labs (4/14): K 4.3, creatinine 1.4, LDL 48, HDL 38  ECG: NSR with PACs  Allergies (verified):  1)  ! Pcn 2)  Naprosyn (Naproxen)  Past Medical History: 1. Hypertension 2. CAD: Inferior MI in 2003 with BMS to RCA.  DES to CFX in 2008.  Lexiscan myoview in 5/13 showed no ischemia or infarction.  3. Osteoporosis 4. Diverticulosis, colon 5. GERD 6. Hyperlipidemia. 7. Leg pain and decreased pulses consistent with peripheral vascular disease. 8. Paroxysmal atrial fibrillation with history of DCCV.  9. Diastolic CHF: Echo (4/11) with EF 55-60%, moderate MR, moderate to severe LAE, severe RAE, moderate to severe TR. Echo (5/13): EF 60-65%, moderate TR, PASP 43 mmHg.  L>R lower leg edema chronically.  10. Dementia 11. CKD  Family History: Father: Died unknown age Mother: Died at age 59 leukemia and HTN Siblings: One brother died at age 35 from  smoking and lung cancer. Half brother died at age 3 heart problems, smoker.  Half brother A  31 Smoker  Deaf Half Sister A 37  Social History: Marital Status: Married, lives with wife in [place].  Children: 6 Occupation: Retired Clinical research associate.   ROS: All systems reviewed and  negative except as per HPI.   Current Outpatient Prescriptions  Medication Sig Dispense Refill  . atorvastatin (LIPITOR) 80 MG tablet TAKE 1 TABLET BY MOUTH EVERY DAY  90 tablet  0  . ferrous sulfate 325 (65 FE) MG tablet Take 325 mg by mouth daily with breakfast.        . furosemide (LASIX) 40 MG tablet Take 2 tablets (80 mg total) by mouth daily.  60 tablet  5  . NAMENDA 10 MG tablet TAKE 1 TABLET BY MOUTH TWICE DAILY  60 tablet  3  . potassium chloride SA (K-DUR,KLOR-CON) 20 MEQ tablet TAKE 1 TABLET BY MOUTH TWICE DAILY  60 tablet  3  . ramipril (ALTACE) 10 MG capsule Take 1 capsule (10 mg total) by mouth daily.  90 capsule  1  . warfarin (COUMADIN) 5 MG tablet Take as directed by coumadin clinic  60 tablet  1  . amLODipine (NORVASC) 2.5 MG tablet Take 1 tablet (2.5 mg total) by mouth daily.  30 tablet  6   No current facility-administered medications for this visit.    BP 160/78  Pulse 59  Ht 5\' 11"  (1.803 m)  Wt 202 lb 12.8 oz (91.989 kg)  BMI 28.3 kg/m2 General:  Elderly man in no distress.  Neck:  Neck supple, no JVD. No masses, thyromegaly or abnormal cervical nodes. Lungs:  Clear bilaterally to auscultation and percussion. Heart:  Non-displaced PMI, chest non-tender; regular  rate and rhythm, S1, S2. +S4. 1/6 systolic murmur LLSB and apex.  Carotid upstroke normal, no bruit.  1+ edema 1/3 to knee bilaterally. Abdomen:  Bowel sounds positive; abdomen soft and non-tender without masses, organomegaly, or hernias noted. No hepatosplenomegaly. Extremities:  No clubbing or cyanosis. Neurologic:  Alert and oriented x 3.  Some memory difficulty.  Psych:  Normal affect.  Assessment/Plan:  Atrial fibrillation H/o PAF. NSR today. He has been stable on his feet with no mechanical falls. Continue warfarin. No rate control medications given baseline low heart rate.  CAD, NATIVE VESSEL  Negative myoview in 5/13. No chest pain. Would continue coumadin, atorvastatin, ramipril.   Hyperlipidemia Continue atorvastatin.   Hypertension BP running high, add amlodipine 2.5 mg daily.  BP check in 2 wks, he will monitor his BP with home cuff daily.  Chronic diastolic CHF Patient is on Lasix 80 mg daily.  No JVD, NYHA class II.  Check BMET/BNP today.  Suspect venous insufficiency plays a role in the lower extremity edema.   [name] 07/26/2013

## 2013-08-01 ENCOUNTER — Other Ambulatory Visit: Payer: Self-pay | Admitting: Cardiology

## 2013-08-08 ENCOUNTER — Telehealth: Payer: Self-pay | Admitting: *Deleted

## 2013-08-08 MED ORDER — AMLODIPINE BESYLATE 5 MG PO TABS: 5.0000 mg | ORAL_TABLET | Freq: Every day | ORAL | Status: DC

## 2013-08-08 NOTE — Telephone Encounter (Signed)
Hypertension  BP running high, add amlodipine 2.5 mg daily. BP check in 2 wks, he will monitor his BP with home cuff daily   08/08/13 Pt's wife  states pt's BP has been 150/70 range since starting amlodipine 2.5mg . I will forward to Dr Shirlee Latch for review.

## 2013-08-08 NOTE — Telephone Encounter (Signed)
Pt's wife notified.

## 2013-08-08 NOTE — Telephone Encounter (Signed)
Increase amlodipine to 5 mg daily. BP check in 2 wks.

## 2013-08-15 ENCOUNTER — Ambulatory Visit (INDEPENDENT_AMBULATORY_CARE_PROVIDER_SITE_OTHER): Payer: Medicare Other | Admitting: *Deleted

## 2013-08-15 DIAGNOSIS — I4891 Unspecified atrial fibrillation: Secondary | ICD-10-CM

## 2013-08-15 LAB — POCT INR: INR: 2.2

## 2013-08-16 ENCOUNTER — Other Ambulatory Visit: Payer: Self-pay | Admitting: Cardiology

## 2013-08-16 ENCOUNTER — Other Ambulatory Visit: Payer: Self-pay | Admitting: Family Medicine

## 2013-08-27 ENCOUNTER — Telehealth: Payer: Self-pay | Admitting: *Deleted

## 2013-08-27 NOTE — Telephone Encounter (Signed)
Pt's wife states BP better --in the 140/70 range. Pt tolerating amlodipine without problem.

## 2013-08-27 NOTE — Telephone Encounter (Signed)
That is ok 

## 2013-08-28 ENCOUNTER — Other Ambulatory Visit: Payer: Self-pay | Admitting: Cardiology

## 2013-08-29 NOTE — Telephone Encounter (Signed)
Pt's wife notified.

## 2013-08-30 ENCOUNTER — Other Ambulatory Visit: Payer: Self-pay | Admitting: Cardiology

## 2013-09-05 ENCOUNTER — Other Ambulatory Visit: Payer: Self-pay | Admitting: Cardiology

## 2013-09-12 ENCOUNTER — Ambulatory Visit (INDEPENDENT_AMBULATORY_CARE_PROVIDER_SITE_OTHER): Payer: Medicare Other | Admitting: Pharmacist

## 2013-09-12 DIAGNOSIS — I4891 Unspecified atrial fibrillation: Secondary | ICD-10-CM

## 2013-09-12 DIAGNOSIS — Z23 Encounter for immunization: Secondary | ICD-10-CM

## 2013-09-12 LAB — POCT INR: INR: 2.2

## 2013-10-24 ENCOUNTER — Ambulatory Visit (INDEPENDENT_AMBULATORY_CARE_PROVIDER_SITE_OTHER): Payer: Medicare Other | Admitting: *Deleted

## 2013-10-24 DIAGNOSIS — I4891 Unspecified atrial fibrillation: Secondary | ICD-10-CM

## 2013-10-24 LAB — POCT INR: INR: 2.8

## 2013-11-08 ENCOUNTER — Other Ambulatory Visit: Payer: Self-pay | Admitting: Cardiology

## 2013-11-08 ENCOUNTER — Other Ambulatory Visit: Payer: Self-pay | Admitting: Family Medicine

## 2013-11-08 NOTE — Telephone Encounter (Signed)
Electronic refill request.  Please advise. 

## 2013-12-05 ENCOUNTER — Other Ambulatory Visit: Payer: Self-pay | Admitting: Cardiology

## 2013-12-05 ENCOUNTER — Ambulatory Visit (INDEPENDENT_AMBULATORY_CARE_PROVIDER_SITE_OTHER): Payer: Medicare Other | Admitting: *Deleted

## 2013-12-05 DIAGNOSIS — I4891 Unspecified atrial fibrillation: Secondary | ICD-10-CM

## 2013-12-05 DIAGNOSIS — Z5181 Encounter for therapeutic drug level monitoring: Secondary | ICD-10-CM

## 2013-12-05 LAB — POCT INR: INR: 3.2

## 2013-12-05 MED ORDER — WARFARIN SODIUM 5 MG PO TABS: ORAL_TABLET | ORAL | Status: DC

## 2013-12-06 ENCOUNTER — Other Ambulatory Visit: Payer: Self-pay | Admitting: Cardiology

## 2013-12-13 ENCOUNTER — Other Ambulatory Visit: Payer: Self-pay | Admitting: Cardiology

## 2013-12-18 ENCOUNTER — Other Ambulatory Visit: Payer: Self-pay | Admitting: Cardiology

## 2013-12-26 ENCOUNTER — Ambulatory Visit (INDEPENDENT_AMBULATORY_CARE_PROVIDER_SITE_OTHER): Payer: Medicare Other | Admitting: Pharmacist

## 2013-12-26 DIAGNOSIS — I4891 Unspecified atrial fibrillation: Secondary | ICD-10-CM

## 2013-12-26 DIAGNOSIS — Z5181 Encounter for therapeutic drug level monitoring: Secondary | ICD-10-CM

## 2013-12-26 LAB — POCT INR: INR: 1.5

## 2014-01-03 ENCOUNTER — Other Ambulatory Visit: Payer: Self-pay | Admitting: Cardiology

## 2014-01-10 ENCOUNTER — Ambulatory Visit (INDEPENDENT_AMBULATORY_CARE_PROVIDER_SITE_OTHER): Payer: Medicare Other | Admitting: *Deleted

## 2014-01-10 DIAGNOSIS — Z5181 Encounter for therapeutic drug level monitoring: Secondary | ICD-10-CM

## 2014-01-10 DIAGNOSIS — I4891 Unspecified atrial fibrillation: Secondary | ICD-10-CM

## 2014-01-10 LAB — POCT INR: INR: 3

## 2014-01-16 ENCOUNTER — Other Ambulatory Visit: Payer: Self-pay | Admitting: *Deleted

## 2014-01-16 MED ORDER — POTASSIUM CHLORIDE CRYS ER 20 MEQ PO TBCR: EXTENDED_RELEASE_TABLET | ORAL | Status: DC

## 2014-01-27 ENCOUNTER — Other Ambulatory Visit: Payer: Self-pay | Admitting: Cardiology

## 2014-01-31 ENCOUNTER — Ambulatory Visit (INDEPENDENT_AMBULATORY_CARE_PROVIDER_SITE_OTHER): Payer: Medicare Other | Admitting: Pharmacist

## 2014-01-31 DIAGNOSIS — I4891 Unspecified atrial fibrillation: Secondary | ICD-10-CM

## 2014-01-31 DIAGNOSIS — Z5181 Encounter for therapeutic drug level monitoring: Secondary | ICD-10-CM

## 2014-01-31 LAB — POCT INR: INR: 3.8

## 2014-02-07 ENCOUNTER — Other Ambulatory Visit: Payer: Self-pay | Admitting: Cardiology

## 2014-02-13 ENCOUNTER — Other Ambulatory Visit: Payer: Self-pay | Admitting: Cardiology

## 2014-02-14 ENCOUNTER — Ambulatory Visit (INDEPENDENT_AMBULATORY_CARE_PROVIDER_SITE_OTHER): Payer: Medicare Other

## 2014-02-14 DIAGNOSIS — Z5181 Encounter for therapeutic drug level monitoring: Secondary | ICD-10-CM

## 2014-02-14 DIAGNOSIS — I4891 Unspecified atrial fibrillation: Secondary | ICD-10-CM

## 2014-02-14 LAB — POCT INR: INR: 2.3

## 2014-02-14 MED ORDER — POTASSIUM CHLORIDE CRYS ER 20 MEQ PO TBCR: EXTENDED_RELEASE_TABLET | ORAL | Status: DC

## 2014-02-20 ENCOUNTER — Other Ambulatory Visit: Payer: Self-pay | Admitting: Cardiology

## 2014-02-24 ENCOUNTER — Other Ambulatory Visit: Payer: Self-pay | Admitting: Cardiology

## 2014-03-03 ENCOUNTER — Other Ambulatory Visit: Payer: Self-pay | Admitting: Family Medicine

## 2014-03-07 ENCOUNTER — Ambulatory Visit (INDEPENDENT_AMBULATORY_CARE_PROVIDER_SITE_OTHER): Payer: Medicare Other | Admitting: Pharmacist Clinician (PhC)/ Clinical Pharmacy Specialist

## 2014-03-07 DIAGNOSIS — Z5181 Encounter for therapeutic drug level monitoring: Secondary | ICD-10-CM

## 2014-03-07 DIAGNOSIS — I4891 Unspecified atrial fibrillation: Secondary | ICD-10-CM

## 2014-03-07 LAB — POCT INR: INR: 2.5

## 2014-03-10 ENCOUNTER — Other Ambulatory Visit: Payer: Self-pay | Admitting: Cardiology

## 2014-03-27 ENCOUNTER — Encounter: Payer: Self-pay | Admitting: Cardiology

## 2014-03-27 ENCOUNTER — Ambulatory Visit (INDEPENDENT_AMBULATORY_CARE_PROVIDER_SITE_OTHER): Payer: Medicare Other | Admitting: Cardiology

## 2014-03-27 VITALS — BP 104/64 | HR 70 | Ht 68.0 in | Wt 187.0 lb

## 2014-03-27 DIAGNOSIS — R0989 Other specified symptoms and signs involving the circulatory and respiratory systems: Secondary | ICD-10-CM

## 2014-03-27 DIAGNOSIS — I5032 Chronic diastolic (congestive) heart failure: Secondary | ICD-10-CM

## 2014-03-27 DIAGNOSIS — R0609 Other forms of dyspnea: Secondary | ICD-10-CM

## 2014-03-27 DIAGNOSIS — I10 Essential (primary) hypertension: Secondary | ICD-10-CM

## 2014-03-27 DIAGNOSIS — I4891 Unspecified atrial fibrillation: Secondary | ICD-10-CM

## 2014-03-27 DIAGNOSIS — I2510 Atherosclerotic heart disease of native coronary artery without angina pectoris: Secondary | ICD-10-CM

## 2014-03-27 DIAGNOSIS — E785 Hyperlipidemia, unspecified: Secondary | ICD-10-CM

## 2014-03-27 NOTE — Patient Instructions (Signed)
Your physician recommends that you have lab today--Lipid profile/BMET/BNP/CBCd.  Your physician wants you to follow-up in: 6 months with Dr Shirlee Latch. (December 2015). You will receive a reminder letter in the mail two months in advance. If you don't receive a letter, please call our office to schedule the follow-up appointment.

## 2014-03-28 LAB — BRAIN NATRIURETIC PEPTIDE: PRO B NATRI PEPTIDE: 277 pg/mL — AB (ref 0.0–100.0)

## 2014-03-28 LAB — CBC WITH DIFFERENTIAL/PLATELET
Basophils Absolute: 0 10*3/uL (ref 0.0–0.1)
Basophils Relative: 0.6 % (ref 0.0–3.0)
Eosinophils Absolute: 0.1 10*3/uL (ref 0.0–0.7)
Eosinophils Relative: 1.5 % (ref 0.0–5.0)
HCT: 35.4 % — ABNORMAL LOW (ref 39.0–52.0)
HEMOGLOBIN: 11.9 g/dL — AB (ref 13.0–17.0)
LYMPHS PCT: 28.9 % (ref 12.0–46.0)
Lymphs Abs: 1.5 10*3/uL (ref 0.7–4.0)
MCHC: 33.7 g/dL (ref 30.0–36.0)
MCV: 92.5 fl (ref 78.0–100.0)
MONOS PCT: 1.5 % — AB (ref 3.0–12.0)
Monocytes Absolute: 0.1 10*3/uL (ref 0.1–1.0)
Neutro Abs: 3.5 10*3/uL (ref 1.4–7.7)
Neutrophils Relative %: 67.5 % (ref 43.0–77.0)
PLATELETS: 200 10*3/uL (ref 150.0–400.0)
RBC: 3.83 Mil/uL — ABNORMAL LOW (ref 4.22–5.81)
RDW: 14.6 % (ref 11.5–15.5)
WBC: 5.2 10*3/uL (ref 4.0–10.5)

## 2014-03-28 LAB — LIPID PANEL
Cholesterol: 119 mg/dL (ref 0–200)
HDL: 45.2 mg/dL (ref >39.00)
LDL Cholesterol: 53 mg/dL (ref 0–99)
NonHDL: 73.8
TRIGLYCERIDES: 104 mg/dL (ref 0.0–149.0)
Total CHOL/HDL Ratio: 3
VLDL: 20.8 mg/dL (ref 0.0–40.0)

## 2014-03-28 LAB — BASIC METABOLIC PANEL
BUN: 26 mg/dL — AB (ref 6–23)
CALCIUM: 9.4 mg/dL (ref 8.4–10.5)
CO2: 27 mEq/L (ref 19–32)
Chloride: 104 mEq/L (ref 96–112)
Creatinine, Ser: 1.7 mg/dL — ABNORMAL HIGH (ref 0.4–1.5)
GFR: 42.01 mL/min — AB (ref >60.00)
GLUCOSE: 98 mg/dL (ref 70–99)
Potassium: 4.7 mEq/L (ref 3.5–5.1)
Sodium: 139 mEq/L (ref 135–145)

## 2014-03-29 NOTE — Addendum Note (Signed)
Addended by: Jacqlyn KraussLANKFORD, Birch Farino M on: 03/29/2014 07:19 AM   Modules accepted: Orders

## 2014-03-29 NOTE — Progress Notes (Signed)
Patient ID: Tony Reed L Blount, male   DOB: [birth year], 80 y.o.   MRN: [id number] PCP: Dr. Para Marchuncan  80 yo with history of CAD, diastolic CHF, and paroxysmal atrial fibrillation returns for cardiology followup.  He has had a low resting heart rate in the past and has been off nodal blockade.  Today, he is actually in atrial fibrillation with HR in the 70s.  He has not noted palpitations or increased dyspnea and is not sure how long he has been in atrial fibrillation.  He can walk around the house without dyspnea and is able walk his dog a few blocks without problems.  No lightheadedness or syncope.  No falls though his daughter says he is unsteady at times.  BP is on the low side today.  No chest pain.  No overt GI bleeding on warfarin.  He has trouble with his memory.  He does not have much of an appetite and weight is down 15 lbs.    Labs (10/11): K 3.9, creatinine 1.5, LDL 79, HDL 53 Labs (2/12): K 3.9, creatinine 1.3, BNP 267 Labs (9/12): LDL 65, HDL 50 Labs (4/13): K 3.7, creatinine 1.6 Labs (5/13): K 4, creatinine 1.5 Labs (7/13): K 4.2, creatinine 1.4, LDL 72, HDL 54 Labs (4/14): K 4.3, creatinine 1.4, LDL 48, HDL 38 Labs (10/14): K 4, creatinine 1.6, BNP 276  ECG: atrial fibrillation at 70, otherwise normal  Allergies (verified):  1)  ! Pcn 2)  Naprosyn (Naproxen)  Past Medical History: 1. Hypertension 2. CAD: Inferior MI in 2003 with BMS to RCA.  DES to CFX in 2008.  Lexiscan myoview in 5/13 showed no ischemia or infarction.  3. Osteoporosis 4. Diverticulosis, colon 5. GERD 6. Hyperlipidemia. 7. Leg pain and decreased pulses consistent with peripheral vascular disease. 8. Paroxysmal atrial fibrillation with history of DCCV.  9. Diastolic CHF: Echo (4/11) with EF 55-60%, moderate MR, moderate to severe LAE, severe RAE, moderate to severe TR. Echo (5/13): EF 60-65%, moderate TR, PASP 43 mmHg.  L>R lower leg edema chronically.  10. Dementia 11. CKD  Family History: Father: Died  unknown age Mother: Died at age 78 leukemia and HTN Siblings: One brother died at age 78 from  smoking and lung cancer. Half brother died at age 78 heart problems, smoker.  Half brother A  [id number] Smoker  Deaf Half Sister A [id number]  Social History: Marital Status: Married, lives [place] in [place].  Children: 6 Occupation: Retired Clinical research associateonsmoker.   ROS: All systems reviewed and negative except as per HPI.   Current Outpatient Prescriptions  Medication Sig Dispense Refill  . amLODipine (NORVASC) 5 MG tablet TAKE 1 TABLET BY MOUTH EVERY DAY  30 tablet  1  . atorvastatin (LIPITOR) 80 MG tablet TAKE 1 TABLET BY MOUTH EVERY DAY  90 tablet  0  . ferrous sulfate 325 (65 FE) MG tablet Take 325 mg by mouth daily with breakfast.        . furosemide (LASIX) 40 MG tablet TAKE 2 TABLETS BY MOUTH EVERY DAY  60 tablet  2  . NAMENDA 10 MG tablet TAKE 1 TABLET BY MOUTH TWICE DAILY  60 tablet  0  . potassium chloride SA (K-DUR,KLOR-CON) 20 MEQ tablet TAKE 1 TABLET BY MOUTH TWICE DAILY  60 tablet  1  . ramipril (ALTACE) 10 MG capsule TAKE ONE CAPSULE BY MOUTH ONCE DAILY  90 capsule  0  . warfarin (COUMADIN) 5 MG tablet Take as directed by coumadin clinic  90 tablet  1   No current facility-administered medications for this visit.    BP 104/64  Pulse 70  Ht 5\' 8"  (1.727 m)  Wt 84.823 kg (187 lb)  BMI 28.44 kg/m2 General:  Elderly man in no distress.  Neck:  Neck supple, no JVD. No masses, thyromegaly or abnormal cervical nodes. Lungs:  Clear bilaterally to auscultation and percussion. Heart:  Non-displaced PMI, chest non-tender; irregular rate and rhythm, S1, S2. 1/6 systolic murmur LLSB and apex.  Carotid upstroke normal, no bruit.  1+ edema 1/3 to knee bilaterally (stable). Abdomen:  Bowel sounds positive; abdomen soft and non-tender without masses, organomegaly, or hernias noted. No hepatosplenomegaly. Extremities:  No clubbing or cyanosis. Neurologic:  Alert and oriented x 3.  Some memory difficulty.   Psych:  Normal affect.  Assessment/Plan:  Atrial fibrillation He is in atrial fibrillation today but has not noticed. He has been stable on his feet with no mechanical falls. Continue warfarin. He has a history of bradycardia when in NSR and HR is ok currently, so will leave off nodal blockers for now.  Given lack of symptoms, would follow for now without cardioversion attempt.  Check CBC today given coumadin use.  CAD, NATIVE VESSEL  Negative myoview in 5/13. No chest pain. Would continue coumadin, atorvastatin, ramipril.  Hyperlipidemia Continue atorvastatin, check lipids today.  Hypertension BP is on the low side.  Decrease amlodipine to 2.5 mg daily.  Chronic diastolic CHF Patient is on Lasix 80 mg daily.  No JVD, NYHA class II.  Check BMET/BNP today.    Marca AnconaDalton McLean 03/29/2014

## 2014-04-04 ENCOUNTER — Ambulatory Visit (INDEPENDENT_AMBULATORY_CARE_PROVIDER_SITE_OTHER): Payer: Medicare Other | Admitting: *Deleted

## 2014-04-04 DIAGNOSIS — I4891 Unspecified atrial fibrillation: Secondary | ICD-10-CM

## 2014-04-04 DIAGNOSIS — Z5181 Encounter for therapeutic drug level monitoring: Secondary | ICD-10-CM

## 2014-04-04 LAB — POCT INR: INR: 2.4

## 2014-04-09 ENCOUNTER — Other Ambulatory Visit: Payer: Self-pay | Admitting: Family Medicine

## 2014-04-09 NOTE — Telephone Encounter (Signed)
Electronic refill request, please advise  

## 2014-04-10 NOTE — Telephone Encounter (Signed)
Due for f/u visit.  30 min.  Thanks. Sent.

## 2014-04-10 NOTE — Telephone Encounter (Signed)
Spoke to patient's wife and was advised that his daughter brings him for his appointments and she will call her and get her to call the office and schedule this.

## 2014-04-11 ENCOUNTER — Ambulatory Visit (INDEPENDENT_AMBULATORY_CARE_PROVIDER_SITE_OTHER): Payer: Medicare Other | Admitting: Family Medicine

## 2014-04-11 ENCOUNTER — Encounter: Payer: Self-pay | Admitting: Family Medicine

## 2014-04-11 VITALS — BP 104/66 | HR 79 | Temp 98.0°F | Wt 189.5 lb

## 2014-04-11 DIAGNOSIS — R413 Other amnesia: Secondary | ICD-10-CM

## 2014-04-11 MED ORDER — MEMANTINE HCL 10 MG PO TABS: ORAL_TABLET | ORAL | Status: DC

## 2014-04-11 NOTE — Progress Notes (Signed)
Pre visit review using our clinic review tool, if applicable. No additional management support is needed unless otherwise documented below in the visit note. Patient is currently out of Namenda and wife is asking if he could be tried on the other medication for memory that would possibly be more effective.  Patient is still walking some, out of the house.  D/w family about supervision at home.  No episodes of getting lost recently, d/w family.  Not driving.  He isn't using the stove.  He doesn't have access to his meds, family helps with that.  Can still feed himself.  Can take a bath w/o help.  No falls.  Sleeping fairly well.  Usually napping during the day.  No B/B incontinence, but did have some diarrhea recently.  No fevers.  Possibly with recent URI sx, some cough.  No dysphagia.   D/w family re: namenda.  If no sig change coming off namenda, then wouldn't need to restart it.    Discussed strain on family.  They have about 5 family members in total helping out.    Meds, vitals, and allergies reviewed.   ROS: See HPI.  Otherwise, noncontributory.  nad ncat Mmm rrr ctab abd soft Ext w/o edema Speech fluent, but he reintroduces himself mult times during the interview Not oriented to year, but is to self.

## 2014-04-11 NOTE — Patient Instructions (Addendum)
Check to make sure the amlodipine is cut back to 2.5mg  a day.  It may be at the point that the namenda isn't helping much. If he stays of the medicine and you don't see a change, then you can just stop it.  It is unlikely that other medicines would work better at this point.  I wouldn't leave Tony Reed alone. If you notice other behavioral changes, then notify me.  Take care.

## 2014-04-12 NOTE — Assessment & Plan Note (Signed)
Slowly declining.  Patient is still walking some, out of the house.  D/w family about supervision at home.  No episodes of getting lost recently, d/w family.  Not driving.  He isn't using the stove.  He doesn't have access to his meds, family helps with that.  Can still feed himself.  Can take a bath w/o help.  No falls.  Sleeping fairly well.  Usually napping during the day.  No B/B incontinence, but did have some diarrhea recently.  No fevers. No dysphagia.  D/w family re: namenda.  If no sig change coming off namenda, then wouldn't need to restart it.  Prev labs reviewed, per cards, unremarkable.  >25 minutes spent in face to face time with patient, >50% spent in counselling or coordination of care.

## 2014-04-16 ENCOUNTER — Other Ambulatory Visit: Payer: Self-pay | Admitting: Cardiology

## 2014-05-16 ENCOUNTER — Ambulatory Visit (INDEPENDENT_AMBULATORY_CARE_PROVIDER_SITE_OTHER): Payer: Medicare Other | Admitting: *Deleted

## 2014-05-16 DIAGNOSIS — I4891 Unspecified atrial fibrillation: Secondary | ICD-10-CM

## 2014-05-16 DIAGNOSIS — Z5181 Encounter for therapeutic drug level monitoring: Secondary | ICD-10-CM

## 2014-05-16 LAB — POCT INR: INR: 3.4

## 2014-05-21 ENCOUNTER — Other Ambulatory Visit: Payer: Self-pay | Admitting: Cardiology

## 2014-05-30 ENCOUNTER — Other Ambulatory Visit: Payer: Self-pay | Admitting: Cardiology

## 2014-06-09 ENCOUNTER — Other Ambulatory Visit: Payer: Self-pay | Admitting: Cardiology

## 2014-06-09 ENCOUNTER — Other Ambulatory Visit: Payer: Self-pay | Admitting: Cardiovascular Disease

## 2014-06-13 ENCOUNTER — Ambulatory Visit (INDEPENDENT_AMBULATORY_CARE_PROVIDER_SITE_OTHER): Payer: Medicare Other

## 2014-06-13 DIAGNOSIS — I4891 Unspecified atrial fibrillation: Secondary | ICD-10-CM

## 2014-06-13 DIAGNOSIS — Z5181 Encounter for therapeutic drug level monitoring: Secondary | ICD-10-CM

## 2014-06-13 LAB — POCT INR: INR: 2.9

## 2014-07-11 ENCOUNTER — Ambulatory Visit (INDEPENDENT_AMBULATORY_CARE_PROVIDER_SITE_OTHER): Payer: Medicare Other | Admitting: Pharmacist

## 2014-07-11 DIAGNOSIS — Z5181 Encounter for therapeutic drug level monitoring: Secondary | ICD-10-CM

## 2014-07-11 DIAGNOSIS — I4891 Unspecified atrial fibrillation: Secondary | ICD-10-CM

## 2014-07-11 LAB — POCT INR: INR: 2.4

## 2014-08-05 ENCOUNTER — Other Ambulatory Visit: Payer: Self-pay | Admitting: Cardiology

## 2014-08-07 ENCOUNTER — Other Ambulatory Visit: Payer: Self-pay | Admitting: Family Medicine

## 2014-08-08 ENCOUNTER — Ambulatory Visit (INDEPENDENT_AMBULATORY_CARE_PROVIDER_SITE_OTHER): Payer: Medicare Other | Admitting: *Deleted

## 2014-08-08 DIAGNOSIS — I4891 Unspecified atrial fibrillation: Secondary | ICD-10-CM

## 2014-08-08 DIAGNOSIS — Z5181 Encounter for therapeutic drug level monitoring: Secondary | ICD-10-CM

## 2014-08-08 LAB — POCT INR: INR: 2.6

## 2014-08-14 ENCOUNTER — Other Ambulatory Visit: Payer: Self-pay | Admitting: Cardiology

## 2014-08-16 ENCOUNTER — Other Ambulatory Visit: Payer: Self-pay

## 2014-08-29 ENCOUNTER — Other Ambulatory Visit: Payer: Self-pay | Admitting: Cardiology

## 2014-09-06 ENCOUNTER — Other Ambulatory Visit: Payer: Self-pay | Admitting: Cardiovascular Disease

## 2014-09-06 ENCOUNTER — Other Ambulatory Visit: Payer: Self-pay | Admitting: Cardiology

## 2014-09-09 ENCOUNTER — Other Ambulatory Visit: Payer: Self-pay | Admitting: Cardiology

## 2014-09-10 ENCOUNTER — Other Ambulatory Visit: Payer: Self-pay | Admitting: Cardiology

## 2014-09-11 ENCOUNTER — Other Ambulatory Visit: Payer: Self-pay | Admitting: *Deleted

## 2014-09-11 NOTE — Telephone Encounter (Signed)
error 

## 2014-09-19 ENCOUNTER — Ambulatory Visit (INDEPENDENT_AMBULATORY_CARE_PROVIDER_SITE_OTHER): Payer: Medicare Other | Admitting: *Deleted

## 2014-09-19 DIAGNOSIS — Z5181 Encounter for therapeutic drug level monitoring: Secondary | ICD-10-CM

## 2014-09-19 DIAGNOSIS — I4891 Unspecified atrial fibrillation: Secondary | ICD-10-CM

## 2014-09-19 LAB — POCT INR: INR: 3.2

## 2014-10-04 ENCOUNTER — Encounter: Payer: Self-pay | Admitting: Cardiology

## 2014-10-04 ENCOUNTER — Ambulatory Visit (INDEPENDENT_AMBULATORY_CARE_PROVIDER_SITE_OTHER): Payer: Medicare Other | Admitting: Pharmacist

## 2014-10-04 ENCOUNTER — Ambulatory Visit (INDEPENDENT_AMBULATORY_CARE_PROVIDER_SITE_OTHER): Payer: Medicare Other | Admitting: Cardiology

## 2014-10-04 ENCOUNTER — Ambulatory Visit (INDEPENDENT_AMBULATORY_CARE_PROVIDER_SITE_OTHER): Payer: Medicare Other

## 2014-10-04 VITALS — BP 100/68 | HR 63 | Ht 68.0 in | Wt 190.0 lb

## 2014-10-04 DIAGNOSIS — Z23 Encounter for immunization: Secondary | ICD-10-CM

## 2014-10-04 DIAGNOSIS — Z5181 Encounter for therapeutic drug level monitoring: Secondary | ICD-10-CM

## 2014-10-04 DIAGNOSIS — I5032 Chronic diastolic (congestive) heart failure: Secondary | ICD-10-CM

## 2014-10-04 DIAGNOSIS — I480 Paroxysmal atrial fibrillation: Secondary | ICD-10-CM

## 2014-10-04 DIAGNOSIS — I4891 Unspecified atrial fibrillation: Secondary | ICD-10-CM

## 2014-10-04 DIAGNOSIS — I2510 Atherosclerotic heart disease of native coronary artery without angina pectoris: Secondary | ICD-10-CM

## 2014-10-04 LAB — CBC WITH DIFFERENTIAL/PLATELET
BASOS PCT: 1 % (ref 0–1)
Basophils Absolute: 0 10*3/uL (ref 0.0–0.1)
Eosinophils Absolute: 0.1 10*3/uL (ref 0.0–0.7)
Eosinophils Relative: 2 % (ref 0–5)
HCT: 33.2 % — ABNORMAL LOW (ref 39.0–52.0)
Hemoglobin: 11.3 g/dL — ABNORMAL LOW (ref 13.0–17.0)
Lymphocytes Relative: 30 % (ref 12–46)
Lymphs Abs: 1.2 10*3/uL (ref 0.7–4.0)
MCH: 31.1 pg (ref 26.0–34.0)
MCHC: 34 g/dL (ref 30.0–36.0)
MCV: 91.5 fL (ref 78.0–100.0)
MONO ABS: 0.5 10*3/uL (ref 0.1–1.0)
MPV: 9.3 fL — ABNORMAL LOW (ref 9.4–12.4)
Monocytes Relative: 13 % — ABNORMAL HIGH (ref 3–12)
NEUTROS PCT: 54 % (ref 43–77)
Neutro Abs: 2.2 10*3/uL (ref 1.7–7.7)
Platelets: 180 10*3/uL (ref 150–400)
RBC: 3.63 MIL/uL — ABNORMAL LOW (ref 4.22–5.81)
RDW: 14.2 % (ref 11.5–15.5)
WBC: 4 10*3/uL (ref 4.0–10.5)

## 2014-10-04 LAB — POCT INR: INR: 2

## 2014-10-04 NOTE — Patient Instructions (Signed)
Stop amlodipine.   Your physician recommends that you have  lab work today--BMET/CBCd  Your physician wants you to follow-up in: 6 months with Dr Shirlee LatchMcLean. (June 2016).  You will receive a reminder letter in the mail two months in advance. If you don't receive a letter, please call our office to schedule the follow-up appointment.

## 2014-10-05 LAB — BASIC METABOLIC PANEL
BUN: 25 mg/dL — AB (ref 6–23)
CO2: 26 mEq/L (ref 19–32)
CREATININE: 1.45 mg/dL — AB (ref 0.50–1.35)
Calcium: 9 mg/dL (ref 8.4–10.5)
Chloride: 102 mEq/L (ref 96–112)
GLUCOSE: 94 mg/dL (ref 70–99)
Potassium: 3.9 mEq/L (ref 3.5–5.3)
SODIUM: 141 meq/L (ref 135–145)

## 2014-10-06 NOTE — Progress Notes (Signed)
Patient ID: Tony Reed, male   DOB: 03/01/1926, 80 y.o.   MRN: [id number] PCP: Dr. Para Marchuncan  80 yo with history of CAD, diastolic CHF, and persistent atrial fibrillation returns for cardiology followup.  He has had a low resting heart rate in the past and has been off nodal blockade.  He remains in atrial fibrillation today.  He has not noted palpitations or increased dyspnea and is not sure how long he has been in atrial fibrillation.  He can walk around the house without dyspnea and is able walk his dog a few blocks without problems.  No lightheadedness or syncope.  No falls though his daughter says he is unsteady at times.  BP is on the low side today.  No chest pain.  No overt GI bleeding on warfarin.  He has trouble with his memory.    Labs (10/11): K 3.9, creatinine 1.5, LDL 79, HDL 53 Labs (2/12): K 3.9, creatinine 1.3, BNP 267 Labs (9/12): LDL 65, HDL 50 Labs (4/13): K 3.7, creatinine 1.6 Labs (5/13): K 4, creatinine 1.5 Labs (7/13): K 4.2, creatinine 1.4, LDL 72, HDL 54 Labs (4/14): K 4.3, creatinine 1.4, LDL 48, HDL 38 Labs (10/14): K 4, creatinine 1.6, BNP 276 Labs (6/15): K 4.7, creatinine 1.7, BNP 277, LDL 53, HDL 45  ECG: atrial fibrillation at 63  Allergies (verified):  1)  ! Pcn 2)  Naprosyn (Naproxen)  Past Medical History: 1. Hypertension 2. CAD: Inferior MI in 2003 with BMS to RCA.  DES to CFX in 2008.  Lexiscan myoview in 5/13 showed no ischemia or infarction.  3. Osteoporosis 4. Diverticulosis, colon 5. GERD 6. Hyperlipidemia. 7. Leg pain and decreased pulses consistent with peripheral vascular disease. 8. Paroxysmal atrial fibrillation with history of DCCV.  9. Diastolic CHF: Echo (4/11) with EF 55-60%, moderate MR, moderate to severe LAE, severe RAE, moderate to severe TR. Echo (5/13): EF 60-65%, moderate TR, PASP 43 mmHg.  L>R lower leg edema chronically.  10. Dementia 11. CKD  Family History: Father: Died unknown age Mother: Died at age 78 leukemia  and HTN Siblings: One brother died at age 78 from  smoking and lung cancer. Half brother died at age 78 heart problems, smoker.  Half brother A  [id number] Smoker  Deaf Half Sister A [id number]  Social History: Marital Status: Married, lives [place] in [place].  Children: 6 Occupation: Retired Clinical research associateonsmoker.   ROS: All systems reviewed and negative except as per HPI.   Current Outpatient Prescriptions  Medication Sig Dispense Refill  . atorvastatin (LIPITOR) 80 MG tablet TAKE 1 TABLET BY MOUTH EVERY DAY 90 tablet 0  . ferrous sulfate 325 (65 FE) MG tablet Take 325 mg by mouth daily with breakfast.      . furosemide (LASIX) 40 MG tablet TAKE 2 TABLETS EVERY DAY 60 tablet 3  . memantine (NAMENDA) 10 MG tablet TAKE 1 TABLET BY MOUTH TWICE DAILY 60 tablet 12  . potassium chloride SA (K-DUR,KLOR-CON) 20 MEQ tablet TAKE 1 TABLET BY MOUTH TWICE DAILY 60 tablet 11  . ramipril (ALTACE) 10 MG capsule TAKE ONE CAPSULE BY MOUTH EVERY DAY 90 capsule 0  . warfarin (COUMADIN) 5 MG tablet TAKE AS DIRECTED BY COUMADIN CLINIC 90 tablet 0   No current facility-administered medications for this visit.    BP 100/68 mmHg  Pulse 63  Ht 5\' 8"  (1.727 m)  Wt 190 lb (86.183 kg)  BMI 28.90 kg/m2 General:  Elderly man in no distress.  Neck:  Neck supple, no JVD. No masses, thyromegaly or abnormal cervical nodes. Lungs:  Clear bilaterally to auscultation and percussion. Heart:  Non-displaced PMI, chest non-tender; irregular rate and rhythm, S1, S2. 1/6 systolic murmur LLSB and apex.  Carotid upstroke normal, no bruit.  1+ ankle edema. Abdomen:  Bowel sounds positive; abdomen soft and non-tender without masses, organomegaly, or hernias noted. No hepatosplenomegaly. Extremities:  No clubbing or cyanosis. Neurologic:  Alert and oriented x 3.  Some memory difficulty.  Psych:  Normal affect.  Assessment/Plan:  Atrial fibrillation Persistent.  He does not feel palpitations and has had no increase in dyspnea. He has been  stable on his feet with no mechanical falls. Continue warfarin. He has a history of bradycardia when in NSR and HR is ok currently, so will leave off nodal blockers for now.  Given lack of symptoms, would follow for now without cardioversion attempt.  Check CBC today given coumadin use.  CAD Negative myoview in 5/13. No chest pain. Would continue coumadin, atorvastatin, ramipril.  Hyperlipidemia Continue atorvastatin, good lipids 6/15.  Hypertension BP is on the low side.  I think he can stop amlodipine. Chronic diastolic CHF Patient is on Lasix 80 mg daily.  No JVD, NYHA class II.  Check BMET today.    Marca AnconaDalton Kile Kabler 10/06/2014

## 2014-10-24 ENCOUNTER — Ambulatory Visit (INDEPENDENT_AMBULATORY_CARE_PROVIDER_SITE_OTHER): Payer: Medicare Other | Admitting: Pharmacist

## 2014-10-24 DIAGNOSIS — I4891 Unspecified atrial fibrillation: Secondary | ICD-10-CM | POA: Diagnosis not present

## 2014-10-24 DIAGNOSIS — Z5181 Encounter for therapeutic drug level monitoring: Secondary | ICD-10-CM | POA: Diagnosis not present

## 2014-10-24 LAB — POCT INR: INR: 2.6

## 2014-11-21 ENCOUNTER — Ambulatory Visit (INDEPENDENT_AMBULATORY_CARE_PROVIDER_SITE_OTHER): Payer: Medicare Other | Admitting: Pharmacist

## 2014-11-21 DIAGNOSIS — Z5181 Encounter for therapeutic drug level monitoring: Secondary | ICD-10-CM | POA: Diagnosis not present

## 2014-11-21 DIAGNOSIS — I4891 Unspecified atrial fibrillation: Secondary | ICD-10-CM

## 2014-11-21 LAB — POCT INR: INR: 2.3

## 2014-11-29 ENCOUNTER — Other Ambulatory Visit: Payer: Self-pay | Admitting: Cardiology

## 2014-12-06 ENCOUNTER — Other Ambulatory Visit: Payer: Self-pay | Admitting: Cardiology

## 2015-01-02 ENCOUNTER — Ambulatory Visit (INDEPENDENT_AMBULATORY_CARE_PROVIDER_SITE_OTHER): Payer: Medicare Other

## 2015-01-02 DIAGNOSIS — Z5181 Encounter for therapeutic drug level monitoring: Secondary | ICD-10-CM

## 2015-01-02 DIAGNOSIS — I4891 Unspecified atrial fibrillation: Secondary | ICD-10-CM | POA: Diagnosis not present

## 2015-01-02 LAB — POCT INR: INR: 2.8

## 2015-01-06 ENCOUNTER — Other Ambulatory Visit: Payer: Self-pay | Admitting: Cardiology

## 2015-02-13 ENCOUNTER — Ambulatory Visit (INDEPENDENT_AMBULATORY_CARE_PROVIDER_SITE_OTHER): Payer: Medicare Other | Admitting: *Deleted

## 2015-02-13 DIAGNOSIS — Z5181 Encounter for therapeutic drug level monitoring: Secondary | ICD-10-CM | POA: Diagnosis not present

## 2015-02-13 DIAGNOSIS — I4891 Unspecified atrial fibrillation: Secondary | ICD-10-CM | POA: Diagnosis not present

## 2015-02-13 LAB — POCT INR: INR: 2.9

## 2015-03-07 ENCOUNTER — Other Ambulatory Visit: Payer: Self-pay | Admitting: Adult Health

## 2015-03-08 ENCOUNTER — Other Ambulatory Visit: Payer: Self-pay | Admitting: Cardiology

## 2015-03-11 ENCOUNTER — Other Ambulatory Visit: Payer: Self-pay | Admitting: Cardiology

## 2015-03-11 ENCOUNTER — Other Ambulatory Visit: Payer: Self-pay | Admitting: Adult Health

## 2015-03-18 ENCOUNTER — Encounter: Payer: Self-pay | Admitting: Cardiology

## 2015-03-18 ENCOUNTER — Ambulatory Visit (INDEPENDENT_AMBULATORY_CARE_PROVIDER_SITE_OTHER): Payer: Medicare Other | Admitting: Cardiology

## 2015-03-18 VITALS — BP 124/54 | HR 69 | Ht 68.0 in | Wt 185.0 lb

## 2015-03-18 DIAGNOSIS — I2510 Atherosclerotic heart disease of native coronary artery without angina pectoris: Secondary | ICD-10-CM

## 2015-03-18 DIAGNOSIS — I10 Essential (primary) hypertension: Secondary | ICD-10-CM | POA: Diagnosis not present

## 2015-03-18 DIAGNOSIS — I5032 Chronic diastolic (congestive) heart failure: Secondary | ICD-10-CM

## 2015-03-18 DIAGNOSIS — I481 Persistent atrial fibrillation: Secondary | ICD-10-CM | POA: Diagnosis not present

## 2015-03-18 DIAGNOSIS — I4819 Other persistent atrial fibrillation: Secondary | ICD-10-CM

## 2015-03-18 LAB — BASIC METABOLIC PANEL
BUN: 32 mg/dL — ABNORMAL HIGH (ref 6–23)
CALCIUM: 9.4 mg/dL (ref 8.4–10.5)
CO2: 27 mEq/L (ref 19–32)
CREATININE: 1.53 mg/dL — AB (ref 0.40–1.50)
Chloride: 102 mEq/L (ref 96–112)
GFR: 46.05 mL/min — AB (ref >60.00)
GLUCOSE: 114 mg/dL — AB (ref 70–99)
Potassium: 4.1 mEq/L (ref 3.5–5.1)
Sodium: 136 mEq/L (ref 135–145)

## 2015-03-18 LAB — CBC WITH DIFFERENTIAL/PLATELET
Basophils Absolute: 0 10*3/uL (ref 0.0–0.1)
Basophils Relative: 0.6 % (ref 0.0–3.0)
Eosinophils Absolute: 0.1 10*3/uL (ref 0.0–0.7)
Eosinophils Relative: 1.7 % (ref 0.0–5.0)
HCT: 35.3 % — ABNORMAL LOW (ref 39.0–52.0)
HEMOGLOBIN: 11.9 g/dL — AB (ref 13.0–17.0)
Lymphocytes Relative: 24.5 % (ref 12.0–46.0)
Lymphs Abs: 1.2 10*3/uL (ref 0.7–4.0)
MCHC: 33.7 g/dL (ref 30.0–36.0)
MCV: 94.3 fl (ref 78.0–100.0)
MONO ABS: 0.4 10*3/uL (ref 0.1–1.0)
Monocytes Relative: 8.6 % (ref 3.0–12.0)
NEUTROS ABS: 3.3 10*3/uL (ref 1.4–7.7)
Neutrophils Relative %: 64.6 % (ref 43.0–77.0)
Platelets: 197 10*3/uL (ref 150.0–400.0)
RBC: 3.74 Mil/uL — AB (ref 4.22–5.81)
RDW: 14.7 % (ref 11.5–15.5)
WBC: 5 10*3/uL (ref 4.0–10.5)

## 2015-03-18 LAB — LIPID PANEL
Cholesterol: 136 mg/dL (ref 0–200)
HDL: 47.9 mg/dL (ref >39.00)
LDL Cholesterol: 60 mg/dL (ref 0–99)
NONHDL: 88.1
Total CHOL/HDL Ratio: 3
Triglycerides: 141 mg/dL (ref 0.0–149.0)
VLDL: 28.2 mg/dL (ref 0.0–40.0)

## 2015-03-18 LAB — BRAIN NATRIURETIC PEPTIDE: PRO B NATRI PEPTIDE: 280 pg/mL — AB (ref 0.0–100.0)

## 2015-03-18 NOTE — Patient Instructions (Signed)
Medication Instructions:  No changes today.  Labwork: Lipid profile/CBCd/BMET/BNP today  Testing/Procedures: None today.  Follow-Up: Your physician wants you to follow-up in: 6 months with Dr Shirlee LatchMcLean. (November 2016).  You will receive a reminder letter in the mail two months in advance. If you don't receive a letter, please call our office to schedule the follow-up appointment.

## 2015-03-19 NOTE — Progress Notes (Signed)
Patient ID: Tony Reed, male   DOB: 1926-05-14, 81 y.o.   MRN: [id number] PCP: Dr. Para Marchuncan  81 yo with history of CAD, diastolic CHF, and persistent atrial fibrillation returns for cardiology followup.  He has had a low resting heart rate in the past and has been off nodal blockade.  He remains in atrial fibrillation today.  He has not noted palpitations.  He can walk around the block without shortness of breath.  No lightheadedness or syncope.  No falls though his daughter says he is unsteady at times.  No chest pain.  No overt GI bleeding on warfarin.  He has trouble with his memory.  Weight is down 5 lbs.    Labs (10/11): K 3.9, creatinine 1.5, LDL 79, HDL 53 Labs (2/12): K 3.9, creatinine 1.3, BNP 267 Labs (9/12): LDL 65, HDL 50 Labs (4/13): K 3.7, creatinine 1.6 Labs (5/13): K 4, creatinine 1.5 Labs (7/13): K 4.2, creatinine 1.4, LDL 72, HDL 54 Labs (4/14): K 4.3, creatinine 1.4, LDL 48, HDL 38 Labs (10/14): K 4, creatinine 1.6, BNP 276 Labs (6/15): K 4.7, creatinine 1.7, BNP 277, LDL 53, HDL 45 Labs (12/15): HCT 33.2, K 3.9, creatinine 1.45  ECG: atrial fibrillation, iRBBB  Allergies (verified):  1)  ! Pcn 2)  Naprosyn (Naproxen)  Past Medical History: 1. Hypertension 2. CAD: Inferior MI in 2003 with BMS to RCA.  DES to CFX in 2008.  Lexiscan myoview in 5/13 showed no ischemia or infarction.  3. Osteoporosis 4. Diverticulosis, colon 5. GERD 6. Hyperlipidemia. 7. Leg pain and decreased pulses consistent with peripheral vascular disease. 8. Paroxysmal atrial fibrillation with history of DCCV.  9. Diastolic CHF: Echo (4/11) with EF 55-60%, moderate MR, moderate to severe LAE, severe RAE, moderate to severe TR. Echo (5/13): EF 60-65%, moderate TR, PASP 43 mmHg.  L>R lower leg edema chronically.  10. Dementia 11. CKD  Family History: Father: Died unknown age Mother: Died at age 79 leukemia and HTN Siblings: One brother died at age 79 from smoking and lung cancer. Half  brother died at age 79 heart problems, smoker.   Social History: Marital Status: Married, [place] in [place].  Children: 6 Occupation: Retired Clinical research associateonsmoker.   ROS: All systems reviewed and negative except as per HPI.   Current Outpatient Prescriptions  Medication Sig Dispense Refill  . atorvastatin (LIPITOR) 80 MG tablet TAKE 1 TABLET BY MOUTH EVERY DAY 90 tablet 0  . ferrous sulfate 325 (65 FE) MG tablet Take 325 mg by mouth daily with breakfast.      . furosemide (LASIX) 40 MG tablet TAKE 2 TABLETS BY MOUTH EVERY DAY 60 tablet 3  . memantine (NAMENDA) 10 MG tablet TAKE 1 TABLET BY MOUTH TWICE DAILY 60 tablet 12  . potassium chloride SA (K-DUR,KLOR-CON) 20 MEQ tablet TAKE 1 TABLET BY MOUTH TWICE DAILY 60 tablet 11  . ramipril (ALTACE) 10 MG capsule TAKE ONE CAPSULE BY MOUTH EVERY DAY 90 capsule 0  . warfarin (COUMADIN) 5 MG tablet TAKE AS DIRECTED BY COUMADIN CLINIC 90 tablet 1   No current facility-administered medications for this visit.    BP 124/54 mmHg  Pulse 69  Ht 5\' 8"  (1.727 m)  Wt 185 lb (83.915 kg)  BMI 28.14 kg/m2 General:  Elderly man in no distress.  Neck:  Neck supple, no JVD. No masses, thyromegaly or abnormal cervical nodes. Lungs:  Clear bilaterally to auscultation and percussion. Heart:  Non-displaced PMI, chest non-tender; irregular rate and rhythm, S1,  S2. 1/6 early SEM RUSB.  Carotid upstroke normal, no bruit.  1+ ankle edema L>R. Abdomen:  Bowel sounds positive; abdomen soft and non-tender without masses, organomegaly, or hernias noted. No hepatosplenomegaly. Extremities:  No clubbing or cyanosis. Neurologic:  Alert and oriented x 3.  Some memory difficulty.  Psych:  Normal affect.  Assessment/Plan:  Atrial fibrillation Persistent.  He does not feel palpitations. He has been stable on his feet with no mechanical falls. Continue warfarin. He has a history of bradycardia when in NSR and HR is ok currently, so will leave off nodal blockers for now.   Given lack of symptoms, would follow for without cardioversion attempt.  Check CBC today given coumadin use.  CAD Negative myoview in 5/13. No chest pain. Would continue coumadin, atorvastatin, ramipril.  Hyperlipidemia Continue atorvastatin, check lipids today.  Hypertension BP is controlled.  Chronic diastolic CHF Patient is on Lasix 80 mg daily.  No JVD, NYHA class II.  Check BMET/BNP today.    Followup in 6 months.   [name] 03/19/2015

## 2015-03-27 ENCOUNTER — Ambulatory Visit (INDEPENDENT_AMBULATORY_CARE_PROVIDER_SITE_OTHER): Payer: Medicare Other | Admitting: *Deleted

## 2015-03-27 DIAGNOSIS — I4891 Unspecified atrial fibrillation: Secondary | ICD-10-CM

## 2015-03-27 DIAGNOSIS — Z5181 Encounter for therapeutic drug level monitoring: Secondary | ICD-10-CM

## 2015-03-27 DIAGNOSIS — I4819 Other persistent atrial fibrillation: Secondary | ICD-10-CM

## 2015-03-27 DIAGNOSIS — I481 Persistent atrial fibrillation: Secondary | ICD-10-CM | POA: Diagnosis not present

## 2015-03-27 LAB — POCT INR: INR: 2.3

## 2015-05-06 ENCOUNTER — Other Ambulatory Visit: Payer: Self-pay | Admitting: Family Medicine

## 2015-05-06 ENCOUNTER — Other Ambulatory Visit: Payer: Self-pay | Admitting: Cardiology

## 2015-05-06 NOTE — Telephone Encounter (Signed)
Electronic refill request.   Last Filled:    60 tablet 12 RF on 04/11/2014  Please advise.

## 2015-05-06 NOTE — Telephone Encounter (Signed)
Appointment scheduled.

## 2015-05-06 NOTE — Telephone Encounter (Signed)
Sent.  Needs 30 min f/u OV when feasible.  Thanks.

## 2015-05-08 ENCOUNTER — Ambulatory Visit (INDEPENDENT_AMBULATORY_CARE_PROVIDER_SITE_OTHER): Payer: Medicare Other | Admitting: *Deleted

## 2015-05-08 DIAGNOSIS — I4819 Other persistent atrial fibrillation: Secondary | ICD-10-CM

## 2015-05-08 DIAGNOSIS — Z5181 Encounter for therapeutic drug level monitoring: Secondary | ICD-10-CM

## 2015-05-08 DIAGNOSIS — I4891 Unspecified atrial fibrillation: Secondary | ICD-10-CM

## 2015-05-08 DIAGNOSIS — I481 Persistent atrial fibrillation: Secondary | ICD-10-CM

## 2015-05-08 LAB — POCT INR: INR: 1.8

## 2015-05-15 ENCOUNTER — Encounter: Payer: Self-pay | Admitting: Family Medicine

## 2015-05-15 ENCOUNTER — Ambulatory Visit (INDEPENDENT_AMBULATORY_CARE_PROVIDER_SITE_OTHER): Payer: Medicare Other | Admitting: Family Medicine

## 2015-05-15 VITALS — BP 102/60 | HR 80 | Temp 98.6°F | Wt 186.5 lb

## 2015-05-15 DIAGNOSIS — L609 Nail disorder, unspecified: Secondary | ICD-10-CM

## 2015-05-15 DIAGNOSIS — Z7189 Other specified counseling: Secondary | ICD-10-CM | POA: Diagnosis not present

## 2015-05-15 DIAGNOSIS — L602 Onychogryphosis: Secondary | ICD-10-CM

## 2015-05-15 DIAGNOSIS — I10 Essential (primary) hypertension: Secondary | ICD-10-CM | POA: Diagnosis not present

## 2015-05-15 DIAGNOSIS — F0390 Unspecified dementia without behavioral disturbance: Secondary | ICD-10-CM

## 2015-05-15 MED ORDER — RAMIPRIL 5 MG PO CAPS: 5.0000 mg | ORAL_CAPSULE | Freq: Every day | ORAL | Status: DC

## 2015-05-15 NOTE — Progress Notes (Signed)
Pre visit review using our clinic review tool, if applicable. No additional management support is needed unless otherwise documented below in the visit note.  Lightheaded on standing, occ.  With standing, esp if gets up too quickly.  No CP, not SOB.  No BLE edema.    Memory changes noted by family.  Mult people are helping supervise him.  D/w his daughter about monitoring and supervision.   Family is living in the house with family.  Family has noted gradual memory changes.  Family is expecting to be able to continue as is for now at home.    Long toenails noted by family.  Patient needed foot inspection and advice.    Advance directive d/w pt's family.  He likely couldn't make informed choices at this point, since his memory is so poor.  He has no formal advance directive recorded.  He is married so his wife would next in line to make decisions for him should he be incapacitated, d/w pt's daughter today.  I asked his daughter to discuss his goals of care with family members.   Meds, vitals, and allergies reviewed.   ROS: See HPI.  Otherwise, noncontributory.  nad Talkative, fluent speech Not oriented to year or month or day.  Repeats himself with a joke "I tell people I'm 81 years old." Still can read a watch and known his birthday but doesn't do basic math ncat Mmm rrr ctab abd soft Ext w/o edema Dystrophic nails, elongated.  No skin breakdown.

## 2015-05-15 NOTE — Patient Instructions (Addendum)
Change to  ramipril.  If still lightheaded after about 1 week, then let me know.   Update me as needed with BP readings.   An ID bracelet would be a good idea.   Take care.  Glad to see you.  Shirlee Limerick will call about your referral.  See her on the way out.

## 2015-05-16 DIAGNOSIS — Z515 Encounter for palliative care: Secondary | ICD-10-CM | POA: Insufficient documentation

## 2015-05-16 DIAGNOSIS — L602 Onychogryphosis: Secondary | ICD-10-CM | POA: Insufficient documentation

## 2015-05-16 DIAGNOSIS — F0390 Unspecified dementia without behavioral disturbance: Secondary | ICD-10-CM | POA: Insufficient documentation

## 2015-05-16 NOTE — Assessment & Plan Note (Signed)
Progressive memory changes noted by family. Mult people are helping supervise him. D/w his daughter about monitoring and supervision. Family is living in the house with family. Family has noted gradual memory changes. Family is expecting to be able to continue as is for now at home.   No dysphagia.  Still with motor function and speech intact.  Will likely continue to worsen, d/w pt's family.  No change in meds at this point. >25 minutes spent in face to face time with patient, >50% spent in counselling or coordination of care.

## 2015-05-16 NOTE — Assessment & Plan Note (Signed)
Refer

## 2015-05-16 NOTE — Assessment & Plan Note (Signed)
Likely overtreated, will dec ramipril down to  a day.  Family can update me as needed.

## 2015-05-22 ENCOUNTER — Ambulatory Visit (INDEPENDENT_AMBULATORY_CARE_PROVIDER_SITE_OTHER): Payer: Medicare Other | Admitting: *Deleted

## 2015-05-22 DIAGNOSIS — Z5181 Encounter for therapeutic drug level monitoring: Secondary | ICD-10-CM

## 2015-05-22 DIAGNOSIS — I481 Persistent atrial fibrillation: Secondary | ICD-10-CM | POA: Diagnosis not present

## 2015-05-22 DIAGNOSIS — I4819 Other persistent atrial fibrillation: Secondary | ICD-10-CM

## 2015-05-22 DIAGNOSIS — I4891 Unspecified atrial fibrillation: Secondary | ICD-10-CM | POA: Diagnosis not present

## 2015-05-22 LAB — POCT INR: INR: 2

## 2015-06-06 ENCOUNTER — Other Ambulatory Visit: Payer: Self-pay | Admitting: Cardiology

## 2015-06-18 ENCOUNTER — Ambulatory Visit: Payer: Self-pay | Admitting: Podiatry

## 2015-06-18 ENCOUNTER — Encounter: Payer: Self-pay | Admitting: Podiatry

## 2015-06-18 ENCOUNTER — Ambulatory Visit (INDEPENDENT_AMBULATORY_CARE_PROVIDER_SITE_OTHER): Payer: Medicare Other | Admitting: Podiatry

## 2015-06-18 VITALS — BP 141/86 | HR 68 | Resp 12

## 2015-06-18 DIAGNOSIS — B351 Tinea unguium: Secondary | ICD-10-CM | POA: Diagnosis not present

## 2015-06-18 DIAGNOSIS — M79676 Pain in unspecified toe(s): Secondary | ICD-10-CM | POA: Diagnosis not present

## 2015-06-18 NOTE — Patient Instructions (Signed)
Today I debrided the fungal toenails on the right and left feet. The nails become extremely thick and distorted as a result of the fungal infection. Return as needed or at it interval ranging from 3-6 months

## 2015-06-18 NOTE — Progress Notes (Signed)
   Subjective:    Patient ID: Tony Reed, male    DOB: 1926/09/02, 81 y.o.   MRN: [id number]  HPI  This patient presents with his daughter no treatment room who is requesting debridement of father's long thick toenails which he says he complains of discomfort and walking wearing shoes the nails are becoming more uncomfortable over time.Delene Loll attempted to trim the nails ,however, nails became too thick and difficult to trim. Patient and daughter deny any previous professional treatment  Review of Systems  Eyes: Positive for visual disturbance.  Respiratory: Positive for shortness of breath.   Cardiovascular: Positive for leg swelling.       Objective:   Physical Exam  Patient appears confused and has difficulty answering questions  Vascular: No peripheral edema noted bilaterally Varicosities ankles and dorsal feet bilaterally DP pulses 2/4 bilaterally PT pulses 1/4 bilaterally Capillary reflex immediate bilaterally  Neurological: Patient has difficulty responding Sensation to 10 g monofilament wire intact 4/5 bilaterally Ankle reflexes reactive bilaterally Vibratory sensation reactive bilaterally  Dermatological: The toenails are extremely elongated, curling into the distal toes or adjacent toes. The nails are hypertrophic, discolored and tender to direct palpation 6-10  Musculoskeletal: Adductovarus third right toe Mallet toe second left This is no restriction ankle, subtalar, midtarsal joints bilaterally       Assessment & Plan:   Assessment: Patient appears confused Extremely neglected symptomatic onychomycoses 6-10  Plan: I made patient and daughter wear that the nail thickening was chronic and recommended ongoing debridement. Debridement toenails 10 mechanically an electrical without a bleeding. Return as needed or at 3-6 month intervals

## 2015-06-19 ENCOUNTER — Ambulatory Visit (INDEPENDENT_AMBULATORY_CARE_PROVIDER_SITE_OTHER): Payer: Medicare Other | Admitting: *Deleted

## 2015-06-19 DIAGNOSIS — I4891 Unspecified atrial fibrillation: Secondary | ICD-10-CM | POA: Diagnosis not present

## 2015-06-19 DIAGNOSIS — Z5181 Encounter for therapeutic drug level monitoring: Secondary | ICD-10-CM | POA: Diagnosis not present

## 2015-06-19 DIAGNOSIS — I4819 Other persistent atrial fibrillation: Secondary | ICD-10-CM

## 2015-06-19 DIAGNOSIS — I481 Persistent atrial fibrillation: Secondary | ICD-10-CM

## 2015-06-19 LAB — POCT INR: INR: 1.8

## 2015-07-17 ENCOUNTER — Ambulatory Visit (INDEPENDENT_AMBULATORY_CARE_PROVIDER_SITE_OTHER): Payer: Medicare Other | Admitting: *Deleted

## 2015-07-17 DIAGNOSIS — I481 Persistent atrial fibrillation: Secondary | ICD-10-CM

## 2015-07-17 DIAGNOSIS — Z5181 Encounter for therapeutic drug level monitoring: Secondary | ICD-10-CM

## 2015-07-17 DIAGNOSIS — I4891 Unspecified atrial fibrillation: Secondary | ICD-10-CM

## 2015-07-17 DIAGNOSIS — I4819 Other persistent atrial fibrillation: Secondary | ICD-10-CM

## 2015-07-17 LAB — POCT INR: INR: 2.5

## 2015-08-03 ENCOUNTER — Other Ambulatory Visit: Payer: Self-pay | Admitting: Family Medicine

## 2015-08-12 ENCOUNTER — Other Ambulatory Visit: Payer: Self-pay | Admitting: Cardiology

## 2015-08-13 ENCOUNTER — Other Ambulatory Visit: Payer: Self-pay | Admitting: Cardiology

## 2015-08-14 ENCOUNTER — Ambulatory Visit (INDEPENDENT_AMBULATORY_CARE_PROVIDER_SITE_OTHER): Payer: Medicare Other | Admitting: *Deleted

## 2015-08-14 DIAGNOSIS — I4819 Other persistent atrial fibrillation: Secondary | ICD-10-CM

## 2015-08-14 DIAGNOSIS — I4891 Unspecified atrial fibrillation: Secondary | ICD-10-CM | POA: Diagnosis not present

## 2015-08-14 DIAGNOSIS — Z5181 Encounter for therapeutic drug level monitoring: Secondary | ICD-10-CM

## 2015-08-14 DIAGNOSIS — I481 Persistent atrial fibrillation: Secondary | ICD-10-CM

## 2015-08-14 LAB — POCT INR: INR: 1.9

## 2015-09-04 ENCOUNTER — Ambulatory Visit (INDEPENDENT_AMBULATORY_CARE_PROVIDER_SITE_OTHER): Payer: Medicare Other

## 2015-09-04 DIAGNOSIS — Z5181 Encounter for therapeutic drug level monitoring: Secondary | ICD-10-CM

## 2015-09-04 DIAGNOSIS — I481 Persistent atrial fibrillation: Secondary | ICD-10-CM

## 2015-09-04 DIAGNOSIS — I4891 Unspecified atrial fibrillation: Secondary | ICD-10-CM

## 2015-09-04 DIAGNOSIS — I4819 Other persistent atrial fibrillation: Secondary | ICD-10-CM

## 2015-09-04 LAB — POCT INR: INR: 1.8

## 2015-09-08 ENCOUNTER — Other Ambulatory Visit: Payer: Self-pay

## 2015-09-08 MED ORDER — FUROSEMIDE 40 MG PO TABS: 80.0000 mg | ORAL_TABLET | Freq: Every day | ORAL | Status: DC

## 2015-09-13 ENCOUNTER — Other Ambulatory Visit: Payer: Self-pay | Admitting: Cardiology

## 2015-09-25 ENCOUNTER — Ambulatory Visit (INDEPENDENT_AMBULATORY_CARE_PROVIDER_SITE_OTHER): Payer: Medicare Other

## 2015-09-25 DIAGNOSIS — I481 Persistent atrial fibrillation: Secondary | ICD-10-CM

## 2015-09-25 DIAGNOSIS — I4891 Unspecified atrial fibrillation: Secondary | ICD-10-CM | POA: Diagnosis not present

## 2015-09-25 DIAGNOSIS — Z5181 Encounter for therapeutic drug level monitoring: Secondary | ICD-10-CM

## 2015-09-25 DIAGNOSIS — I4819 Other persistent atrial fibrillation: Secondary | ICD-10-CM

## 2015-09-25 LAB — PROTIME-INR
INR: 6.22 — AB (ref <1.50)
Prothrombin Time: 56.1 seconds — ABNORMAL HIGH (ref 11.6–15.2)

## 2015-09-25 LAB — POCT INR: INR: >8

## 2015-09-30 ENCOUNTER — Ambulatory Visit (INDEPENDENT_AMBULATORY_CARE_PROVIDER_SITE_OTHER): Payer: Medicare Other | Admitting: *Deleted

## 2015-09-30 DIAGNOSIS — I4891 Unspecified atrial fibrillation: Secondary | ICD-10-CM | POA: Diagnosis not present

## 2015-09-30 DIAGNOSIS — Z5181 Encounter for therapeutic drug level monitoring: Secondary | ICD-10-CM | POA: Diagnosis not present

## 2015-09-30 DIAGNOSIS — I481 Persistent atrial fibrillation: Secondary | ICD-10-CM | POA: Diagnosis not present

## 2015-09-30 DIAGNOSIS — I4819 Other persistent atrial fibrillation: Secondary | ICD-10-CM

## 2015-09-30 LAB — POCT INR: INR: 1.5

## 2015-10-07 ENCOUNTER — Ambulatory Visit (INDEPENDENT_AMBULATORY_CARE_PROVIDER_SITE_OTHER): Payer: Medicare Other | Admitting: *Deleted

## 2015-10-07 DIAGNOSIS — Z5181 Encounter for therapeutic drug level monitoring: Secondary | ICD-10-CM | POA: Diagnosis not present

## 2015-10-07 DIAGNOSIS — I481 Persistent atrial fibrillation: Secondary | ICD-10-CM

## 2015-10-07 DIAGNOSIS — I4891 Unspecified atrial fibrillation: Secondary | ICD-10-CM

## 2015-10-07 DIAGNOSIS — I4819 Other persistent atrial fibrillation: Secondary | ICD-10-CM

## 2015-10-07 LAB — POCT INR: INR: 1.9

## 2015-10-21 ENCOUNTER — Other Ambulatory Visit (INDEPENDENT_AMBULATORY_CARE_PROVIDER_SITE_OTHER): Payer: Medicare Other | Admitting: *Deleted

## 2015-10-21 ENCOUNTER — Other Ambulatory Visit: Payer: Self-pay | Admitting: *Deleted

## 2015-10-21 ENCOUNTER — Ambulatory Visit (INDEPENDENT_AMBULATORY_CARE_PROVIDER_SITE_OTHER): Payer: Medicare Other | Admitting: *Deleted

## 2015-10-21 ENCOUNTER — Other Ambulatory Visit: Payer: Self-pay | Admitting: Nurse Practitioner

## 2015-10-21 DIAGNOSIS — R791 Abnormal coagulation profile: Secondary | ICD-10-CM | POA: Diagnosis not present

## 2015-10-21 DIAGNOSIS — Z7901 Long term (current) use of anticoagulants: Secondary | ICD-10-CM

## 2015-10-21 DIAGNOSIS — I481 Persistent atrial fibrillation: Secondary | ICD-10-CM | POA: Diagnosis not present

## 2015-10-21 DIAGNOSIS — I4891 Unspecified atrial fibrillation: Secondary | ICD-10-CM | POA: Diagnosis not present

## 2015-10-21 DIAGNOSIS — Z5181 Encounter for therapeutic drug level monitoring: Secondary | ICD-10-CM | POA: Diagnosis not present

## 2015-10-21 DIAGNOSIS — I4819 Other persistent atrial fibrillation: Secondary | ICD-10-CM

## 2015-10-21 LAB — POCT INR: INR: 7

## 2015-10-21 NOTE — Addendum Note (Signed)
Addended by: Tonita PhoenixBOWDEN, Chamia Schmutz K on: 10/21/2015 02:46 PM   Modules accepted: Orders

## 2015-10-21 NOTE — Addendum Note (Signed)
Addended by: Tonita PhoenixBOWDEN, ROBIN K on: 10/21/2015 02:45 PM   Modules accepted: Orders

## 2015-10-21 NOTE — Addendum Note (Signed)
Addended by: Tonita PhoenixBOWDEN, ROBIN K on: 10/21/2015 03:34 PM   Modules accepted: Orders

## 2015-10-21 NOTE — Addendum Note (Signed)
Addended by: Tonita PhoenixBOWDEN, ROBIN K on: 10/21/2015 02:39 PM   Modules accepted: Orders

## 2015-10-21 NOTE — Addendum Note (Signed)
Addended by: Tonita PhoenixBOWDEN, Darrian Grzelak K on: 10/21/2015 02:56 PM   Modules accepted: Orders

## 2015-10-24 ENCOUNTER — Encounter: Payer: Self-pay | Admitting: Family Medicine

## 2015-10-24 ENCOUNTER — Ambulatory Visit (INDEPENDENT_AMBULATORY_CARE_PROVIDER_SITE_OTHER): Payer: Medicare Other | Admitting: Family Medicine

## 2015-10-24 VITALS — BP 134/78 | HR 82 | Temp 97.9°F | Wt 185.5 lb

## 2015-10-24 DIAGNOSIS — F0390 Unspecified dementia without behavioral disturbance: Secondary | ICD-10-CM

## 2015-10-24 DIAGNOSIS — R05 Cough: Secondary | ICD-10-CM | POA: Diagnosis not present

## 2015-10-24 DIAGNOSIS — R059 Cough, unspecified: Secondary | ICD-10-CM

## 2015-10-24 MED ORDER — MEMANTINE HCL 10 MG PO TABS: 5.0000 mg | ORAL_TABLET | Freq: Two times a day (BID) | ORAL | Status: DC

## 2015-10-24 MED ORDER — BENZONATATE 200 MG PO CAPS: 200.0000 mg | ORAL_CAPSULE | Freq: Three times a day (TID) | ORAL | Status: DC | PRN

## 2015-10-24 NOTE — Patient Instructions (Signed)
The cough should gradually get better. Use tessalon if needed for the cough.  Likely was from a virus.  The cough can hang around for a few weeks but will usually (slowly) get better.  Cut the namenda back to 1/2 tab twice a day.  See if that helps the diarrhea.  Update me as needed.   Take care.  Glad to see you.

## 2015-10-24 NOTE — Progress Notes (Signed)
Pre visit review using our clinic review tool, if applicable. No additional management support is needed unless otherwise documented below in the visit note.  He had elevated INR and has f/u for recheck pending, with coumadin dosing prev adjusted.   Had some diarrhea, ongoing intermittently, likely started with namenda.  Had been worse in the meantime, ie the last few weeks.  No contacts with GI sx, no abx use.  No blood in stool.    Cough.   Started about 2 weeks ago.  Mult sick contacts with URI sx.  No sputum.  No known fevers, not thought to have a fever.  Has been fatigued recently, family has noted.  Not SOB.  No BLE edema.  No abd pain.  No ST, no ear pain.  Has been taking otc cough medicine.  Cough is slowly getting better.    Meds, vitals, and allergies reviewed.   ROS: See HPI.  Otherwise, noncontributory.  Dementia at baseline, pleasant but he repeats himself.   Nad ncat Mmm OP wnl except for minimal posterior irritation.  Minimal nasal irritation Not SOB, ctab rrr abd soft Ext with trace BLE edema

## 2015-10-27 DIAGNOSIS — R059 Cough, unspecified: Secondary | ICD-10-CM | POA: Insufficient documentation

## 2015-10-27 DIAGNOSIS — R05 Cough: Secondary | ICD-10-CM | POA: Insufficient documentation

## 2015-10-27 NOTE — Assessment & Plan Note (Signed)
Cough is slowly getting better.  Nontoxic.  Likely residual sx from URI. Tessalon prn.  F/u prn.  Family agrees.  Okay for outpatient fu.

## 2015-10-27 NOTE — Assessment & Plan Note (Signed)
He can try lower dose of namenda, 1/2 tab bid to see if diarrhea improves.  Family understands the med change.  Update us as needed.

## 2015-10-30 ENCOUNTER — Ambulatory Visit (INDEPENDENT_AMBULATORY_CARE_PROVIDER_SITE_OTHER): Payer: Medicare Other | Admitting: *Deleted

## 2015-10-30 DIAGNOSIS — I4891 Unspecified atrial fibrillation: Secondary | ICD-10-CM | POA: Diagnosis not present

## 2015-10-30 DIAGNOSIS — I4819 Other persistent atrial fibrillation: Secondary | ICD-10-CM

## 2015-10-30 DIAGNOSIS — I481 Persistent atrial fibrillation: Secondary | ICD-10-CM

## 2015-10-30 DIAGNOSIS — Z5181 Encounter for therapeutic drug level monitoring: Secondary | ICD-10-CM | POA: Diagnosis not present

## 2015-10-30 LAB — POCT INR: INR: 2.9

## 2015-11-13 ENCOUNTER — Ambulatory Visit (INDEPENDENT_AMBULATORY_CARE_PROVIDER_SITE_OTHER): Payer: Medicare Other | Admitting: *Deleted

## 2015-11-13 DIAGNOSIS — I4819 Other persistent atrial fibrillation: Secondary | ICD-10-CM

## 2015-11-13 DIAGNOSIS — I481 Persistent atrial fibrillation: Secondary | ICD-10-CM | POA: Diagnosis not present

## 2015-11-13 DIAGNOSIS — Z5181 Encounter for therapeutic drug level monitoring: Secondary | ICD-10-CM | POA: Diagnosis not present

## 2015-11-13 DIAGNOSIS — I4891 Unspecified atrial fibrillation: Secondary | ICD-10-CM | POA: Diagnosis not present

## 2015-11-13 LAB — POCT INR: INR: 4.1

## 2015-11-20 ENCOUNTER — Ambulatory Visit (INDEPENDENT_AMBULATORY_CARE_PROVIDER_SITE_OTHER): Payer: Medicare Other

## 2015-11-20 DIAGNOSIS — Z5181 Encounter for therapeutic drug level monitoring: Secondary | ICD-10-CM | POA: Diagnosis not present

## 2015-11-20 DIAGNOSIS — I481 Persistent atrial fibrillation: Secondary | ICD-10-CM | POA: Diagnosis not present

## 2015-11-20 DIAGNOSIS — I4891 Unspecified atrial fibrillation: Secondary | ICD-10-CM

## 2015-11-20 DIAGNOSIS — I4819 Other persistent atrial fibrillation: Secondary | ICD-10-CM

## 2015-11-20 LAB — POCT INR: INR: 4.9

## 2015-11-20 MED ORDER — WARFARIN SODIUM 5 MG PO TABS
ORAL_TABLET | ORAL | Status: DC
Start: 2015-11-20 — End: 2015-12-23

## 2015-12-01 ENCOUNTER — Other Ambulatory Visit (HOSPITAL_COMMUNITY)
Admission: RE | Admit: 2015-12-01 | Discharge: 2015-12-01 | Disposition: A | Discharge status: Home / Self Care | Payer: Medicare Other | Source: Ambulatory Visit | Attending: Cardiovascular Disease | Admitting: Cardiovascular Disease

## 2015-12-01 ENCOUNTER — Ambulatory Visit (INDEPENDENT_AMBULATORY_CARE_PROVIDER_SITE_OTHER): Payer: Medicare Other | Admitting: *Deleted

## 2015-12-01 ENCOUNTER — Other Ambulatory Visit: Payer: Self-pay | Admitting: Cardiovascular Disease

## 2015-12-01 DIAGNOSIS — Z7901 Long term (current) use of anticoagulants: Secondary | ICD-10-CM | POA: Diagnosis not present

## 2015-12-01 DIAGNOSIS — I4891 Unspecified atrial fibrillation: Secondary | ICD-10-CM | POA: Diagnosis not present

## 2015-12-01 DIAGNOSIS — I4819 Other persistent atrial fibrillation: Secondary | ICD-10-CM

## 2015-12-01 DIAGNOSIS — Z5181 Encounter for therapeutic drug level monitoring: Secondary | ICD-10-CM | POA: Diagnosis not present

## 2015-12-01 DIAGNOSIS — I481 Persistent atrial fibrillation: Secondary | ICD-10-CM | POA: Diagnosis not present

## 2015-12-01 LAB — POCT INR: INR: 7.8

## 2015-12-01 LAB — PROTIME-INR
INR: 6.22 — AB (ref 0.00–1.49)
Prothrombin Time: 53 seconds — ABNORMAL HIGH (ref 11.6–15.2)

## 2015-12-08 ENCOUNTER — Ambulatory Visit (INDEPENDENT_AMBULATORY_CARE_PROVIDER_SITE_OTHER): Payer: Medicare Other

## 2015-12-08 DIAGNOSIS — I4819 Other persistent atrial fibrillation: Secondary | ICD-10-CM

## 2015-12-08 DIAGNOSIS — I4891 Unspecified atrial fibrillation: Secondary | ICD-10-CM

## 2015-12-08 DIAGNOSIS — I481 Persistent atrial fibrillation: Secondary | ICD-10-CM

## 2015-12-08 DIAGNOSIS — Z5181 Encounter for therapeutic drug level monitoring: Secondary | ICD-10-CM

## 2015-12-08 LAB — POCT INR: INR: 2.4

## 2015-12-14 ENCOUNTER — Other Ambulatory Visit: Payer: Self-pay | Admitting: Cardiology

## 2015-12-18 ENCOUNTER — Ambulatory Visit (INDEPENDENT_AMBULATORY_CARE_PROVIDER_SITE_OTHER): Payer: Medicare Other | Admitting: Pharmacist

## 2015-12-18 DIAGNOSIS — I481 Persistent atrial fibrillation: Secondary | ICD-10-CM

## 2015-12-18 DIAGNOSIS — Z5181 Encounter for therapeutic drug level monitoring: Secondary | ICD-10-CM | POA: Diagnosis not present

## 2015-12-18 DIAGNOSIS — I4891 Unspecified atrial fibrillation: Secondary | ICD-10-CM

## 2015-12-18 DIAGNOSIS — I4819 Other persistent atrial fibrillation: Secondary | ICD-10-CM

## 2015-12-18 LAB — PROTIME-INR
INR: 7.69 (ref 0.00–1.49)
Prothrombin Time: 62.1 seconds — ABNORMAL HIGH (ref 11.6–15.2)

## 2015-12-18 LAB — POCT INR

## 2015-12-19 ENCOUNTER — Telehealth: Payer: Self-pay | Admitting: Pharmacist

## 2015-12-19 NOTE — Telephone Encounter (Signed)
Pt followed in Coumadin Clinic - he has a very poor memory and his INR fluctuates greatly with multiple readings > 6. Yesterday he was > 8 and we sent him to lab for stat INR draw. Discussed potentially switching to DOAC with Dr. Shirlee LatchMcLean who gave verbal ok for switch. Called insurance regarding Xarelto copay - brand meds should be monthly copay of $8.25 since he is low income on Medicare. Will discuss with pt at upcoming Coumadin visit on 3/6.

## 2015-12-22 ENCOUNTER — Ambulatory Visit (INDEPENDENT_AMBULATORY_CARE_PROVIDER_SITE_OTHER): Payer: Medicare Other | Admitting: Pharmacist

## 2015-12-22 DIAGNOSIS — I4891 Unspecified atrial fibrillation: Secondary | ICD-10-CM

## 2015-12-22 DIAGNOSIS — Z5181 Encounter for therapeutic drug level monitoring: Secondary | ICD-10-CM

## 2015-12-22 DIAGNOSIS — I4819 Other persistent atrial fibrillation: Secondary | ICD-10-CM

## 2015-12-22 DIAGNOSIS — I481 Persistent atrial fibrillation: Secondary | ICD-10-CM

## 2015-12-22 LAB — CBC
HCT: 35 % — ABNORMAL LOW (ref 39.0–52.0)
Hemoglobin: 11.5 g/dL — ABNORMAL LOW (ref 13.0–17.0)
MCH: 30.5 pg (ref 26.0–34.0)
MCHC: 32.9 g/dL (ref 30.0–36.0)
MCV: 92.8 fL (ref 78.0–100.0)
MPV: 9.4 fL (ref 8.6–12.4)
PLATELETS: 193 10*3/uL (ref 150–400)
RBC: 3.77 MIL/uL — AB (ref 4.22–5.81)
RDW: 14.3 % (ref 11.5–15.5)
WBC: 4.7 10*3/uL (ref 4.0–10.5)

## 2015-12-22 LAB — BASIC METABOLIC PANEL
BUN: 27 mg/dL — ABNORMAL HIGH (ref 7–25)
CALCIUM: 9.4 mg/dL (ref 8.6–10.3)
CO2: 28 mmol/L (ref 20–31)
CREATININE: 1.36 mg/dL — AB (ref 0.70–1.11)
Chloride: 100 mmol/L (ref 98–110)
Glucose, Bld: 96 mg/dL (ref 65–99)
Potassium: 4.6 mmol/L (ref 3.5–5.3)
SODIUM: 138 mmol/L (ref 135–146)

## 2015-12-22 LAB — POCT INR: INR: 2.2

## 2015-12-23 ENCOUNTER — Telehealth: Payer: Self-pay

## 2015-12-23 MED ORDER — RIVAROXABAN 15 MG PO TABS: 15.0000 mg | ORAL_TABLET | Freq: Every day | ORAL | Status: DC

## 2015-12-23 NOTE — Telephone Encounter (Signed)
Prior auth for Xarelto 15mg sent to [facility]. 

## 2015-12-23 NOTE — Addendum Note (Signed)
Addended by: SUPPLE, MEGAN E on: 12/23/2015 08:43 AM   Modules accepted: Orders, Medications

## 2015-12-24 ENCOUNTER — Telehealth: Payer: Self-pay

## 2015-12-24 NOTE — Telephone Encounter (Signed)
Xarelto approved through 10/17/2016. [id number].

## 2016-01-04 ENCOUNTER — Other Ambulatory Visit: Payer: Self-pay | Admitting: Cardiology

## 2016-01-05 ENCOUNTER — Other Ambulatory Visit: Payer: Self-pay | Admitting: Cardiology

## 2016-01-22 ENCOUNTER — Ambulatory Visit (INDEPENDENT_AMBULATORY_CARE_PROVIDER_SITE_OTHER): Payer: Medicare Other | Admitting: *Deleted

## 2016-01-22 DIAGNOSIS — I4891 Unspecified atrial fibrillation: Secondary | ICD-10-CM

## 2016-01-22 LAB — BASIC METABOLIC PANEL
BUN: 21 mg/dL (ref 7–25)
CALCIUM: 9.2 mg/dL (ref 8.6–10.3)
CO2: 27 mmol/L (ref 20–31)
CREATININE: 1.5 mg/dL — AB (ref 0.70–1.11)
Chloride: 103 mmol/L (ref 98–110)
GLUCOSE: 91 mg/dL (ref 65–99)
Potassium: 4 mmol/L (ref 3.5–5.3)
SODIUM: 140 mmol/L (ref 135–146)

## 2016-01-22 LAB — CBC
HCT: 34.5 % — ABNORMAL LOW (ref 38.5–50.0)
HEMOGLOBIN: 11.6 g/dL — AB (ref 13.2–17.1)
MCH: 31.7 pg (ref 27.0–33.0)
MCHC: 33.6 g/dL (ref 32.0–36.0)
MCV: 94.3 fL (ref 80.0–100.0)
MPV: 9.5 fL (ref 7.5–12.5)
PLATELETS: 150 10*3/uL (ref 140–400)
RBC: 3.66 MIL/uL — ABNORMAL LOW (ref 4.20–5.80)
RDW: 14.2 % (ref 11.0–15.0)
WBC: 4.3 10*3/uL (ref 3.8–10.8)

## 2016-01-22 NOTE — Progress Notes (Signed)
Pt was started on Xarelto 15mg  daily for Afib on 12/22/15 by Dr. Marca Anconaalton Mclean.    Reviewed patients medication list.  Pt is not currently on any combined P-gp and strong CYP3A4 inhibitors/inducers (ketoconazole, traconazole, ritonavir, carbamazepine, phenytoin, rifampin, St. John's wort).  Reviewed labs: SCr-1.50, Hb-11.6, Hct-34.5, Weight-84.9kg (187.4 lbs), CrCl-41.66.  Dose appropriate based on CrCl and dosing criteria.   Hgb and HCT Within Normal Limits  A full discussion of the nature of anticoagulants has been carried out.  A benefit/risk analysis has been presented to the patient, so that they understand the justification for choosing anticoagulation with Xarelto at this time.  The need for compliance is stressed.  Pt is aware to take the medication once daily with the largest meal of the day.  Side effects of potential bleeding are discussed, including unusual colored urine or stools, coughing up blood or coffee ground emesis, nose bleeds or serious fall or head trauma.  Discussed signs and symptoms of stroke. The patient should avoid any OTC items containing aspirin or ibuprofen.  Avoid alcohol consumption.   Call if any signs of abnormal bleeding.  Discussed financial obligations and resolved any difficulty in obtaining medication.  Next lab test test in 6 months.   01/22/16-Pt was seen in the office today for a 1 month Xarelto follow-up.  Patient & daughter denies any issues and states the it is affordable. They state to call Jeff-pt's son with results at [phone].  01/22/16 @1648 -Spoke with pt's son & instructed to have pt continue taking Xarelto 15mg  daily & to call back with any questions or issues.

## 2016-02-11 ENCOUNTER — Other Ambulatory Visit: Payer: Self-pay | Admitting: Cardiology

## 2016-03-05 DIAGNOSIS — H5201 Hypermetropia, right eye: Secondary | ICD-10-CM | POA: Diagnosis not present

## 2016-03-17 ENCOUNTER — Other Ambulatory Visit: Payer: Self-pay | Admitting: *Deleted

## 2016-03-17 MED ORDER — FUROSEMIDE 40 MG PO TABS
80.0000 mg | ORAL_TABLET | Freq: Every day | ORAL | Status: DC
Start: 2016-03-17 — End: 2016-03-29

## 2016-03-19 ENCOUNTER — Other Ambulatory Visit: Payer: Self-pay | Admitting: Cardiology

## 2016-03-19 MED ORDER — ATORVASTATIN CALCIUM 80 MG PO TABS: 80.0000 mg | ORAL_TABLET | Freq: Every day | ORAL | Status: DC

## 2016-03-29 ENCOUNTER — Other Ambulatory Visit: Payer: Self-pay | Admitting: Cardiology

## 2016-04-11 ENCOUNTER — Other Ambulatory Visit: Payer: Self-pay | Admitting: Cardiology

## 2016-04-12 ENCOUNTER — Other Ambulatory Visit: Payer: Self-pay | Admitting: Cardiology

## 2016-04-13 NOTE — Telephone Encounter (Signed)
furosemide (LASIX) 40 MG tablet  Medication   Date: 03/29/2016  [facility]  Ordering/Authorizing: Laurey Moralealton S McLean, MD      Order Providers    Prescribing Provider Encounter Provider   Laurey Moralealton S McLean, MD Laurey Moralealton S McLean, MD    Medication Detail      Disp Refills Start End     furosemide (LASIX) 40 MG tablet 30 tablet 0 03/29/2016     Sig: TAKE 2 TABLETS(80 MG) BY MOUTH DAILY, MUST HAVE APPT FOR MORE TABLETS    E-Prescribing Status: Receipt confirmed by pharmacy (03/29/2016 9:51 AM EDT)     [facility]    [id number] - [place], [place] - [address]   Sending 2nd attempt

## 2016-04-17 ENCOUNTER — Other Ambulatory Visit: Payer: Self-pay | Admitting: Cardiology

## 2016-04-19 NOTE — Telephone Encounter (Signed)
It looks like he may have some dementia. I would suggest contacting his wife,Barbara or his daughter,Robin to discuss scheduling office visit with PA/NP so we can continue to refill lasix.  The other option may be to see if PCP would refill lasix.

## 2016-04-19 NOTE — Telephone Encounter (Signed)
Refill request for Lasix received. Last OV with Dr. Shirlee LatchMcLean was 03/18/15 and he was supposed to F/U in 6 months. No F/U appt has been scheduled. Numerous notes (four total) have been sent with his Lasix refills that he needs to call & schedule an appt for additional refills. Last 2 refills he was given a 15 day supply (see below). OK to refill again for 15 day supply? Please advise   furosemide (LASIX) 40 MG tablet 30 tablet 0 03/17/2016 03/29/2016    Sig - Route:  Take 2 tablets (80 mg total) by mouth daily. Patient is overdue for an appointment. Please call and schedule - Oral    Class:  Normal    DAW:  No    Authorizing Provider:  Laurey Moralealton S McLean, MD    Ordering User:  Valrie HartMindy L Isley, CMA     furosemide (LASIX) 40 MG tablet 30 tablet 0 03/29/2016 04/11/2016     Sig:  TAKE 2 TABLETS(80 MG) BY MOUTH DAILY, MUST HAVE APPT FOR MORE TABLETS    Class:  Normal    DAW:  No    Authorizing Provider:  Laurey Moralealton S McLean, MD    Ordering User:  Drue Dunhelsie S Lowe, CMA

## 2016-04-21 ENCOUNTER — Other Ambulatory Visit: Payer: Self-pay | Admitting: Cardiology

## 2016-04-22 ENCOUNTER — Other Ambulatory Visit: Payer: Self-pay | Admitting: *Deleted

## 2016-04-22 MED ORDER — FUROSEMIDE 40 MG PO TABS: 80.0000 mg | ORAL_TABLET | Freq: Every day | ORAL | Status: DC

## 2016-04-22 NOTE — Telephone Encounter (Signed)
furosemide (LASIX) 40 MG tablet  Medication   Date: 04/13/2016  [facility]  Ordering/Authorizing: Laurey Moralealton S McLean, MD      Order Providers    Prescribing Provider Encounter Provider   Laurey Moralealton S McLean, MD Laurey Moralealton S McLean, MD    Medication Detail      Disp Refills Start End     furosemide (LASIX) 40 MG tablet 15 tablet 0 04/13/2016     Sig - Route: Take 2 tablets (80 mg total) by mouth daily. - Oral    Notes to Pharmacy: PT NEEDS APPT FOR FURTHER REFILLS, CALL [phone] & SCHEDULE, 2ND ATTEMPT    E-Prescribing Status: Receipt confirmed by pharmacy (04/13/2016 8:17 AM EDT)     [facility]    [id number] - [place], [place] - [address]

## 2016-04-26 ENCOUNTER — Other Ambulatory Visit: Payer: Self-pay | Admitting: Cardiology

## 2016-05-01 ENCOUNTER — Other Ambulatory Visit: Payer: Self-pay | Admitting: Family Medicine

## 2016-05-03 NOTE — Telephone Encounter (Signed)
Electronic refill request.  Last office visit:   Acute 10/24/15.  No recent CPE.  Please advise.

## 2016-05-03 NOTE — Telephone Encounter (Signed)
Sent. Reasonable for OV with family member present in the next few months.  Thanks.  We can do labs at the OV.  Doesn't need to fast.

## 2016-05-04 NOTE — Telephone Encounter (Signed)
Patient advised. Patient will call back for appt after he speaks with his daughter about bringing him in.

## 2016-05-10 ENCOUNTER — Other Ambulatory Visit: Payer: Self-pay | Admitting: Family Medicine

## 2016-05-10 NOTE — Progress Notes (Signed)
Sent.  OV when possible. Thanks.  

## 2016-05-12 ENCOUNTER — Ambulatory Visit (INDEPENDENT_AMBULATORY_CARE_PROVIDER_SITE_OTHER): Payer: Medicare Other | Admitting: Podiatry

## 2016-05-12 DIAGNOSIS — M79676 Pain in unspecified toe(s): Secondary | ICD-10-CM | POA: Diagnosis not present

## 2016-05-12 DIAGNOSIS — L84 Corns and callosities: Secondary | ICD-10-CM | POA: Diagnosis not present

## 2016-05-12 DIAGNOSIS — B351 Tinea unguium: Secondary | ICD-10-CM

## 2016-05-12 NOTE — Telephone Encounter (Signed)
Patient's wife notified as instructed by telephone and verbalized understanding. Mrs. Stilson stated that her daughter brings patient for his appointments and she is out of town this week. Tony Reed stated that she will have her daughter call and schedule an appointment when she gets back in town.

## 2016-05-12 NOTE — Patient Instructions (Signed)
Today your visit demonstrated extremely thickened and elongated and deformed toenails right and left feet with a large callus on the end of the right great toe. All the nails and calluses were trimmed today and I recommended that you return in at least 4 months

## 2016-05-13 NOTE — Progress Notes (Signed)
Patient ID: Tony Reed, male   DOB: [birth year], 82 y.o.   MRN: [id number]   This patient presents with his daughter no treatment room who is requesting debridement of father's long thick toenails which he says he complains of discomfort and walking wearing shoes the nails are becoming more uncomfortable over time.Delene Loll attempted to trim the nails ,however, nails became too thick and difficult to trim. Patient and daughter deny any previous professional treatmentLast scheduled visit for a similar service was on 06/18/2015 and at that time patient was advised to return at three-month intervals, however, does not present until today  objective Patient appears confused and has difficulty answering questions  Vascular: No peripheral edema noted bilaterally Varicosities ankles and dorsal feet bilaterally DP pulses 2/4 bilaterally PT pulses 1/4 bilaterally Capillary reflex immediate bilaterally  Neurological: Patient has difficulty responding Sensation to 10 g monofilament wire intact 4/5 bilaterally Ankle reflexes reactive bilaterally Vibratory sensation reactive bilaterally  Dermatological: The toenails are extremely elongated, curling into the distal toes or adjacent toes. The nails are hypertrophic, discolored and tender to direct palpation 6-10 callus right hallux with driy blood within the callus  Musculoskeletal: Adductovarus third right toe Mallet toe second left This is no restriction ankle, subtalar, midtarsal joints bilaterally  Assessment: Patient appears confused Extremely neglected symptomatic onychomycoses 6-10 Hyperkeratoses right hallux  Plan: I made patient and daughter wear that the nail thickening was chronic and recommended ongoing debridement. Debridement toenails 10 mechanically an electrical without any bleeding. Debride callus right hallux without any bleeding   reappoint 4 months

## 2016-05-20 ENCOUNTER — Encounter: Payer: Self-pay | Admitting: Family Medicine

## 2016-05-20 ENCOUNTER — Ambulatory Visit (INDEPENDENT_AMBULATORY_CARE_PROVIDER_SITE_OTHER): Payer: Medicare Other | Admitting: Family Medicine

## 2016-05-20 VITALS — BP 102/58 | HR 81 | Temp 97.9°F | Wt 184.5 lb

## 2016-05-20 DIAGNOSIS — E785 Hyperlipidemia, unspecified: Secondary | ICD-10-CM | POA: Diagnosis not present

## 2016-05-20 DIAGNOSIS — E538 Deficiency of other specified B group vitamins: Secondary | ICD-10-CM | POA: Diagnosis not present

## 2016-05-20 DIAGNOSIS — I10 Essential (primary) hypertension: Secondary | ICD-10-CM

## 2016-05-20 DIAGNOSIS — E559 Vitamin D deficiency, unspecified: Secondary | ICD-10-CM | POA: Diagnosis not present

## 2016-05-20 DIAGNOSIS — Z23 Encounter for immunization: Secondary | ICD-10-CM

## 2016-05-20 DIAGNOSIS — R413 Other amnesia: Secondary | ICD-10-CM

## 2016-05-20 LAB — LIPID PANEL
CHOL/HDL RATIO: 3
CHOLESTEROL: 129 mg/dL (ref 0–200)
HDL: 50.7 mg/dL (ref >39.00)
LDL Cholesterol: 55 mg/dL (ref 0–99)
NonHDL: 78.64
TRIGLYCERIDES: 118 mg/dL (ref 0.0–149.0)
VLDL: 23.6 mg/dL (ref 0.0–40.0)

## 2016-05-20 LAB — COMPREHENSIVE METABOLIC PANEL
ALK PHOS: 62 U/L (ref 39–117)
ALT: 16 U/L (ref 0–53)
AST: 20 U/L (ref 0–37)
Albumin: 4.3 g/dL (ref 3.5–5.2)
BILIRUBIN TOTAL: 1 mg/dL (ref 0.2–1.2)
BUN: 30 mg/dL — ABNORMAL HIGH (ref 6–23)
CALCIUM: 9.7 mg/dL (ref 8.4–10.5)
CO2: 28 mEq/L (ref 19–32)
Chloride: 104 mEq/L (ref 96–112)
Creatinine, Ser: 1.67 mg/dL — ABNORMAL HIGH (ref 0.40–1.50)
GFR: 41.51 mL/min — AB (ref >60.00)
GLUCOSE: 106 mg/dL — AB (ref 70–99)
POTASSIUM: 4 meq/L (ref 3.5–5.1)
Sodium: 141 mEq/L (ref 135–145)
TOTAL PROTEIN: 7.8 g/dL (ref 6.0–8.3)

## 2016-05-20 LAB — VITAMIN B12: Vitamin B-12: 135 pg/mL — ABNORMAL LOW (ref 211–911)

## 2016-05-20 LAB — VITAMIN D 25 HYDROXY (VIT D DEFICIENCY, FRACTURES): VITD: 29.1 ng/mL — AB (ref 30.00–100.00)

## 2016-05-20 MED ORDER — VITAMIN B-12 1000 MCG PO TABS: 1000.0000 ug | ORAL_TABLET | Freq: Every day | ORAL | Status: AC

## 2016-05-20 NOTE — Assessment & Plan Note (Signed)
Controlled, continue as is.  Labs pending.  

## 2016-05-20 NOTE — Progress Notes (Signed)
Pre visit review using our clinic review tool, if applicable. No additional management support is needed unless otherwise documented below in the visit note. 

## 2016-05-20 NOTE — Assessment & Plan Note (Addendum)
Due for labs.  Isn't fasting.  No ADE on med.  No myalgias.  Labs pending.

## 2016-05-20 NOTE — Assessment & Plan Note (Signed)
>  25 minutes spent in face to face time with patient, >50% spent in counselling or coordination of care Still on baseline meds.  No ADE on meds.  D/w pt/family about safety.  No acute changes to memory. More troubles with short term memory.  He can still get up, get showered himself.  Still walking for exercise.  Not driving.  Swallowing well.  Gradual changes noted by family. No high risk events, doesn't use the stove.  D/w pt and family.   Continue as is at home for now.

## 2016-05-20 NOTE — Patient Instructions (Addendum)
Go to the lab on the way out.  We'll contact you with your lab report. Take care.  Glad to see you.  Don't change your meds for now.  I would get a flu shot each fall.

## 2016-05-20 NOTE — Progress Notes (Signed)
Memory loss. Still on baseline meds.  No ADE on meds.  D/w pt/family about safety.  No acute changes to memory. More troubles with short term memory.  He can still get up, get showered himself.  Still walking for exercise.  Not driving.  Swallowing well.  Gradual changes noted by family. No high risk events, doesn't use the stove.  D/w pt and family.    H/o B12 and vit D def.    HLD.  Due for labs.  Isn't fasting.  No ADE on med.  No myalgias.    HTN.  No CP, SOB, BLE edema.    PMH and SH reviewed  ROS: Per HPI unless specifically indicated in ROS section   Meds, vitals, and allergies reviewed.   GEN: nad, alert but not oriented to year.   HEENT: mucous membranes moist NECK: supple w/o LA CV: rrr PULM: ctab, no inc wob ABD: soft, +bs EXT: no edema SKIN: no acute rash Pleasant in conversation.

## 2016-06-01 ENCOUNTER — Other Ambulatory Visit: Payer: Self-pay | Admitting: Cardiology

## 2016-06-02 NOTE — Telephone Encounter (Signed)
[facility] calling to refill Furosemide 40mg .  Rx e-fax

## 2016-06-28 ENCOUNTER — Other Ambulatory Visit: Payer: Self-pay | Admitting: Cardiology

## 2016-06-29 NOTE — Telephone Encounter (Signed)
He has an appointment in October 2017 with Dr Rita OharaMcLean-I would refill until then.

## 2016-06-29 NOTE — Telephone Encounter (Signed)
atorvastatin (LIPITOR) 80 MG tablet  Medication  Date: 03/19/2016 [facility] Ordering/Authorizing: Laurey Moralealton S McLean, MD  Order Providers   Prescribing Provider Encounter Provider  Laurey Moralealton S McLean, MD Laurey Moralealton S McLean, MD  Medication Detail    Disp Refills Start End   atorvastatin (LIPITOR) 80 MG tablet 15 tablet 0 03/19/2016    Sig - Route: Take 1 tablet (80 mg total) by mouth daily. - Oral   Notes to Pharmacy: Please call our office to schedule an appointment before anymore refills. [phone]. Thank you 2nd attempt   E-Prescribing Status: Receipt confirmed by pharmacy (03/19/2016 10:55 AM EDT)   [facility]   [id number] - [place], [place] - [address] AT [address]   3RD ATTEMPT

## 2016-07-27 ENCOUNTER — Other Ambulatory Visit: Payer: Self-pay | Admitting: Cardiology

## 2016-07-31 ENCOUNTER — Other Ambulatory Visit: Payer: Self-pay | Admitting: Family Medicine

## 2016-08-05 ENCOUNTER — Encounter: Payer: Self-pay | Admitting: Cardiology

## 2016-08-06 ENCOUNTER — Other Ambulatory Visit: Payer: Self-pay | Admitting: Cardiology

## 2016-08-17 ENCOUNTER — Encounter: Payer: Self-pay | Admitting: Cardiology

## 2016-08-17 ENCOUNTER — Ambulatory Visit (INDEPENDENT_AMBULATORY_CARE_PROVIDER_SITE_OTHER): Payer: Medicare Other | Admitting: Cardiology

## 2016-08-17 VITALS — BP 100/80 | HR 56 | Ht 68.0 in | Wt 186.6 lb

## 2016-08-17 DIAGNOSIS — I4819 Other persistent atrial fibrillation: Secondary | ICD-10-CM

## 2016-08-17 DIAGNOSIS — I481 Persistent atrial fibrillation: Secondary | ICD-10-CM | POA: Diagnosis not present

## 2016-08-17 DIAGNOSIS — I10 Essential (primary) hypertension: Secondary | ICD-10-CM | POA: Diagnosis not present

## 2016-08-17 DIAGNOSIS — I2510 Atherosclerotic heart disease of native coronary artery without angina pectoris: Secondary | ICD-10-CM | POA: Diagnosis not present

## 2016-08-17 LAB — CBC WITH DIFFERENTIAL/PLATELET
BASOS ABS: 0 {cells}/uL (ref 0–200)
BASOS PCT: 0 %
EOS ABS: 92 {cells}/uL (ref 15–500)
Eosinophils Relative: 2 %
HEMATOCRIT: 35.8 % — AB (ref 38.5–50.0)
HEMOGLOBIN: 11.8 g/dL — AB (ref 13.2–17.1)
Lymphocytes Relative: 28 %
Lymphs Abs: 1288 cells/uL (ref 850–3900)
MCH: 31.4 pg (ref 27.0–33.0)
MCHC: 33 g/dL (ref 32.0–36.0)
MCV: 95.2 fL (ref 80.0–100.0)
MONO ABS: 552 {cells}/uL (ref 200–950)
MONOS PCT: 12 %
MPV: 10 fL (ref 7.5–12.5)
NEUTROS ABS: 2668 {cells}/uL (ref 1500–7800)
Neutrophils Relative %: 58 %
Platelets: 168 10*3/uL (ref 140–400)
RBC: 3.76 MIL/uL — ABNORMAL LOW (ref 4.20–5.80)
RDW: 13.9 % (ref 11.0–15.0)
WBC: 4.6 10*3/uL (ref 3.8–10.8)

## 2016-08-17 MED ORDER — RAMIPRIL 2.5 MG PO CAPS: 2.5000 mg | ORAL_CAPSULE | Freq: Every day | ORAL | 1 refills | Status: DC

## 2016-08-17 NOTE — Patient Instructions (Signed)
Medication Instructions:  Decrease ramipril to 2.5mg  daily  Labwork: BMET/CBCd today  Testing/Procedures: none  Follow-Up: Your physician wants you to follow-up in: 6 months with Sunday SpillersLori Gerhardt,NP. (April 2018).  You will receive a reminder letter in the mail two months in advance. If you don't receive a letter, please call our office to schedule the follow-up appointment.        If you need a refill on your cardiac medications before your next appointment, please call your pharmacy.

## 2016-08-18 LAB — BASIC METABOLIC PANEL
BUN: 21 mg/dL (ref 7–25)
CHLORIDE: 103 mmol/L (ref 98–110)
CO2: 25 mmol/L (ref 20–31)
CREATININE: 1.51 mg/dL — AB (ref 0.70–1.11)
Calcium: 9.4 mg/dL (ref 8.6–10.3)
Glucose, Bld: 97 mg/dL (ref 65–99)
POTASSIUM: 3.9 mmol/L (ref 3.5–5.3)
Sodium: 140 mmol/L (ref 135–146)

## 2016-08-18 NOTE — Progress Notes (Signed)
Patient ID: Tony Reed, male   DOB: 04-Jan-1926, 82 y.o.   MRN: [id number] PCP: Dr. Para Marchuncan  82 yo with history of CAD, diastolic CHF, dementia, and persistent atrial fibrillation returns for cardiology followup.  He has had a low resting heart rate in the past and has been off nodal blockade.  He remains in atrial fibrillation today.  He has not noted palpitations.  He can walk around the block without shortness of breath.  No lightheadedness or syncope.  No falls.  No chest pain.  No overt GI bleeding on warfarin.  He has developed dementia.    Labs (10/11): K 3.9, creatinine 1.5, LDL 79, HDL 53 Labs (2/12): K 3.9, creatinine 1.3, BNP 267 Labs (9/12): LDL 65, HDL 50 Labs (4/13): K 3.7, creatinine 1.6 Labs (5/13): K 4, creatinine 1.5 Labs (7/13): K 4.2, creatinine 1.4, LDL 72, HDL 54 Labs (4/14): K 4.3, creatinine 1.4, LDL 48, HDL 38 Labs (10/14): K 4, creatinine 1.6, BNP 276 Labs (6/15): K 4.7, creatinine 1.7, BNP 277, LDL 53, HDL 45 Labs (12/15): HCT 33.2, K 3.9, creatinine 1.45 Labs (8/17): K 4, creatinine 1.67, LDL 60, HDL 48  ECG: atrial fibrillation at 56  Allergies (verified):  1)  ! Pcn 2)  Naprosyn (Naproxen)  Past Medical History: 1. Hypertension 2. CAD: Inferior MI in 2003 with BMS to RCA.  DES to CFX in 2008.  Lexiscan myoview in 5/13 showed no ischemia or infarction.  3. Osteoporosis 4. Diverticulosis, colon 5. GERD 6. Hyperlipidemia. 7. Leg pain and decreased pulses consistent with peripheral vascular disease. 8. Paroxysmal atrial fibrillation with history of DCCV.  9. Diastolic CHF: Echo (4/11) with EF 55-60%, moderate MR, moderate to severe LAE, severe RAE, moderate to severe TR. Echo (5/13): EF 60-65%, moderate TR, PASP 43 mmHg.  L>R lower leg edema chronically.  10. Dementia 11. CKD  Family History: Father: Died unknown age Mother: Died at age 80 leukemia and HTN Siblings: One brother died at age 80 from smoking and lung cancer. Half brother died at age  80 heart problems, smoker.   Social History: Marital Status: Married, lives [place] in [place].  Children: 6 Occupation: Retired Clinical research associateonsmoker.   ROS: All systems reviewed and negative except as per HPI.   Current Outpatient Prescriptions  Medication Sig Dispense Refill  . atorvastatin (LIPITOR) 80 MG tablet TAKE 1 TABLET BY MOUTH EVERY DAY 30 tablet 0  . ferrous sulfate 325 (65 FE) MG tablet Take 325 mg by mouth daily with breakfast.      . furosemide (LASIX) 40 MG tablet TAKE 2 TABLETS BY MOUTH DAILY 60 tablet 3  . memantine (NAMENDA) 10 MG tablet TAKE 1 TABLET BY MOUTH TWICE DAILY 60 tablet 3  . potassium chloride SA (K-DUR,KLOR-CON) 20 MEQ tablet TAKE 1 TABLET BY MOUTH TWICE DAILY. PLEASE KEEP UPCOMING APPOINTMENT FOR FUTHER REFILLS 60 tablet 0  . Rivaroxaban (XARELTO) 15 MG TABS tablet Take 1 tablet (15 mg total) by mouth daily with supper. 30 tablet 11  . vitamin B-12 (CYANOCOBALAMIN) 1000 MCG tablet Take 1 tablet (1,000 mcg total) by mouth daily.    . ramipril (ALTACE) 2.5 MG capsule Take 1 capsule (2.5 mg total) by mouth daily. 90 capsule 1   No current facility-administered medications for this visit.     BP 100/80 (BP Location: Left Arm, Patient Position: Sitting, Cuff Size: Normal)   Pulse (!) 56   Ht 5\' 8"  (1.727 m)   Wt 186 lb 9.6 oz (84.6 kg)  BMI 28.37 kg/m  General:  Elderly man in no distress.  Neck:  Neck supple, no JVD. No masses, thyromegaly or abnormal cervical nodes. Lungs:  Clear bilaterally to auscultation and percussion. Heart:  Non-displaced PMI, chest non-tender; irregular rate and rhythm, S1, S2. 1/6 early SEM RUSB.  Carotid upstroke normal, no bruit.  No edema.  Abdomen:  Bowel sounds positive; abdomen soft and non-tender without masses, organomegaly, or hernias noted. No hepatosplenomegaly. Extremities:  No clubbing or cyanosis. Neurologic:  Alert and oriented x 3.  Some memory difficulty.  Psych:  Normal affect.  Assessment/Plan:  Atrial  fibrillation Persistent.  He does not feel palpitations. He has been stable on his feet with no mechanical falls. Continue warfarin. HR runs on the low side in atrial fibrillation, so will leave off nodal blockers for now.  Given lack of symptoms, would follow for without cardioversion attempt.  Check CBC today given coumadin use.  CAD Negative myoview in 5/13. No chest pain. Would continue coumadin, atorvastatin, ramipril.  Hyperlipidemia Continue atorvastatin, good lipids in 8/17.  Hypertension BP is on the lower side, decrease rampril to 2.5 mg daily.  Chronic diastolic CHF Patient is on Lasix 80 mg daily.  No JVD, NYHA class II.  Check BMET today.    Followup in 6 months with Norma FredricksonLori Gerhardt given my transition to CHF clinic.   Marca AnconaDalton Dailyn Reith 08/18/2016

## 2016-08-24 ENCOUNTER — Other Ambulatory Visit: Payer: Self-pay | Admitting: Cardiology

## 2016-08-31 ENCOUNTER — Other Ambulatory Visit: Payer: Self-pay | Admitting: Family Medicine

## 2016-09-02 ENCOUNTER — Other Ambulatory Visit: Payer: Self-pay | Admitting: Cardiology

## 2016-09-15 ENCOUNTER — Ambulatory Visit (INDEPENDENT_AMBULATORY_CARE_PROVIDER_SITE_OTHER): Payer: Medicare Other | Admitting: Podiatry

## 2016-09-15 NOTE — Progress Notes (Signed)
ERRONEOUS ENCOUNTER NO SHOW  

## 2016-09-26 ENCOUNTER — Other Ambulatory Visit: Payer: Self-pay | Admitting: Cardiology

## 2016-12-03 ENCOUNTER — Other Ambulatory Visit: Payer: Self-pay | Admitting: Cardiology

## 2016-12-31 ENCOUNTER — Telehealth: Payer: Self-pay | Admitting: Family Medicine

## 2016-12-31 NOTE — Telephone Encounter (Signed)
Left message with household member for pt to call back and schedule AWV + labs with Virl AxeLesia and CPE with PCP.

## 2017-02-01 ENCOUNTER — Other Ambulatory Visit: Payer: Self-pay | Admitting: Cardiology

## 2017-02-01 DIAGNOSIS — I10 Essential (primary) hypertension: Secondary | ICD-10-CM

## 2017-02-01 DIAGNOSIS — I4819 Other persistent atrial fibrillation: Secondary | ICD-10-CM

## 2017-02-01 DIAGNOSIS — I2510 Atherosclerotic heart disease of native coronary artery without angina pectoris: Secondary | ICD-10-CM

## 2017-02-16 NOTE — Telephone Encounter (Signed)
Spoke to pt son. Pt's wife is in hospital right now and he is visiting with her. He will have pt call me back directly or call the office to get scheduled for AWV and CPE.

## 2017-03-08 ENCOUNTER — Other Ambulatory Visit: Payer: Self-pay | Admitting: Family Medicine

## 2017-03-16 ENCOUNTER — Ambulatory Visit: Payer: Self-pay | Admitting: Nurse Practitioner

## 2017-03-24 ENCOUNTER — Ambulatory Visit (INDEPENDENT_AMBULATORY_CARE_PROVIDER_SITE_OTHER): Payer: Medicare Other | Admitting: Internal Medicine

## 2017-03-24 ENCOUNTER — Encounter (INDEPENDENT_AMBULATORY_CARE_PROVIDER_SITE_OTHER): Payer: Self-pay

## 2017-03-24 ENCOUNTER — Encounter: Payer: Self-pay | Admitting: Internal Medicine

## 2017-03-24 ENCOUNTER — Other Ambulatory Visit: Payer: Self-pay | Admitting: *Deleted

## 2017-03-24 VITALS — BP 122/62 | HR 63 | Ht 68.0 in | Wt 185.0 lb

## 2017-03-24 DIAGNOSIS — R0602 Shortness of breath: Secondary | ICD-10-CM | POA: Diagnosis not present

## 2017-03-24 DIAGNOSIS — I482 Chronic atrial fibrillation: Secondary | ICD-10-CM | POA: Diagnosis not present

## 2017-03-24 DIAGNOSIS — I5030 Unspecified diastolic (congestive) heart failure: Secondary | ICD-10-CM

## 2017-03-24 DIAGNOSIS — I5032 Chronic diastolic (congestive) heart failure: Secondary | ICD-10-CM | POA: Insufficient documentation

## 2017-03-24 DIAGNOSIS — I4821 Permanent atrial fibrillation: Secondary | ICD-10-CM

## 2017-03-24 DIAGNOSIS — E785 Hyperlipidemia, unspecified: Secondary | ICD-10-CM

## 2017-03-24 DIAGNOSIS — I10 Essential (primary) hypertension: Secondary | ICD-10-CM

## 2017-03-24 DIAGNOSIS — I2510 Atherosclerotic heart disease of native coronary artery without angina pectoris: Secondary | ICD-10-CM

## 2017-03-24 NOTE — Patient Instructions (Signed)
Medication Instructions:  Your physician recommends that you continue on your current medications as directed. Please refer to the Current Medication list given to you today.   Labwork: CMET/CBCd today  Testing/Procedures: Your physician has requested that you have an echocardiogram. Echocardiography is a painless test that uses sound waves to create images of your heart. It provides your doctor with information about the size and shape of your heart and how well your heart's chambers and valves are working. This procedure takes approximately one hour. There are no restrictions for this procedure.    Follow-Up: Your physician recommends that you schedule a follow-up appointment in: 3 months with Dr End.   Any Other Special Instructions Will Be Listed Below (If Applicable).     If you need a refill on your cardiac medications before your next appointment, please call your pharmacy.

## 2017-03-24 NOTE — Progress Notes (Signed)
Follow-up Outpatient Visit Date: 03/24/2017  Primary Care Provider: Joaquim Nam, MD [address] [place] [address]  Chief Complaint: Shortness of breath  HPI:  Tony Reed is a 83 y.o. year-old male with history of coronary artery disease, diastolic heart failure, persistent atrial fibrillation, and dementia, who presents for follow-up of aforementioned cardiac issues. He was last seen by Dr. Shirlee Latch in 08/2016. Tony Reed reports that he has been feeling well, though he provides very limited history owing to his dementia. His daughter, who accompanies him today, states that a different daughter noticed that Tony Reed has appeared short of breath when walking extended distances over the last few weeks. He has not had chest pain, orthopnea, PND, palpitations, lightheadedness, and edema. He is tolerating his current medications well, including rivaroxaban for stroke prophylaxis. He has not fallen or experience significant bleeding.Marland Kitchen  -------------------------------------------------------------------------------------------------- Past Medical History: 1. Hypertension 2. CAD: Inferior MI in 2003 with BMS to RCA.  DES to CFX in 2008.  Lexiscan myoview in 5/13 showed no ischemia or infarction.  3. Osteoporosis 4. Diverticulosis, colon 5. GERD 6. Hyperlipidemia. 7. Leg pain and decreased pulses consistent with peripheral vascular disease. 8. Paroxysmal atrial fibrillation with history of DCCV.  9. Diastolic CHF: Echo (4/11) with EF 55-60%, moderate MR, moderate to severe LAE, severe RAE, moderate to severe TR. Echo (5/13): EF 60-65%, moderate TR, PASP 43 mmHg.  L>R lower leg edema chronically.  10. Dementia 11. CKD  Recent CV Pertinent Labs: Lab Results  Component Value Date   CHOL 129 05/20/2016   HDL 50.70 05/20/2016   LDLCALC 55 05/20/2016   TRIG 118.0 05/20/2016   CHOLHDL 3 05/20/2016   INR 2.2 12/22/2015   INR 7.69 (HH) 12/18/2015   INR 3.1 12/31/2010     K 3.9 08/17/2016   MG 2.1 03/09/2012   BUN 21 08/17/2016   CREATININE 1.51 (H) 08/17/2016    Outpatient Encounter Prescriptions as of 03/24/2017  Medication Sig  . atorvastatin (LIPITOR) 80 MG tablet TAKE 1 TABLET BY MOUTH EVERY DAY  . ferrous sulfate 325 (65 FE) MG tablet Take 325 mg by mouth daily with breakfast.    . furosemide (LASIX) 40 MG tablet TAKE 2 TABLETS BY MOUTH DAILY  . memantine (NAMENDA) 10 MG tablet TAKE 1 TABLET BY MOUTH TWICE DAILY  . potassium chloride SA (K-DUR,KLOR-CON) 20 MEQ tablet Take 1 tablet (20 mEq total) by mouth 2 (two) times daily.  . ramipril (ALTACE) 2.5 MG capsule TAKE 1 CAPSULE(2.5 MG) BY MOUTH DAILY  . vitamin B-12 (CYANOCOBALAMIN) 1000 MCG tablet Take 1 tablet (1,000 mcg total) by mouth daily.  [name] 15 MG TABS tablet TAKE 1 TABLET(15 MG) BY MOUTH DAILY WITH SUPPER  . [DISCONTINUED] potassium chloride SA (K-DUR,KLOR-CON) 20 MEQ tablet TAKE 1 TABLET BY MOUTH TWICE DAILY. PLEASE KEEP UPCOMING APPOINTMENT FOR FUTHER REFILLS   No facility-administered encounter medications on file as of 03/24/2017.     Allergies: Naproxen and Penicillins  Social History   Social History  . Marital status: Married    Spouse name: N/A  . Number of children: 6  . Years of education: N/A   Occupational History  .  Retired   Social History Main Topics  . Smoking status: Never Smoker  . Smokeless tobacco: Never Used  . Alcohol use No  . Drug use: No  . Sexual activity: Yes   Other Topics Concern  . Not on file   Social History Narrative   Retired.  [place]ives [place] in [place].     Family History  Problem Relation Age of Onset  . Hypertension Mother   . Leukemia Mother   . Lung cancer Brother        smoking  . Heart Problems Brother        smoker  . Deafness Brother     Review of Systems: A 12-system review of systems was performed and was negative except as noted in the  HPI.  --------------------------------------------------------------------------------------------------  Physical Exam: BP 122/62   Pulse 63   Ht 5\' 8"  (1.727 m)   Wt 185 lb (83.9 kg)   BMI 28.13 kg/m   General:  Overweight, elderly man seated comfortably in the exam room. He is accompanied by one of his daughters. HEENT: No conjunctival pallor or scleral icterus.  Moist mucous membranes.  OP clear. Neck: Supple without lymphadenopathy, thyromegaly, JVD, or HJR.  No carotid bruit. Lungs: Normal work of breathing.  Clear to auscultation bilaterally without wheezes or crackles. Heart: Irregularly irregular rhythm with 2/6 systolic murmur and 1/6 diastolic murmur..  Non-displaced PMI. Abd: Bowel sounds present.  Soft, NT/ND without hepatosplenomegaly Ext: Trace ankle edema bilaterally.  2+ radial and 1+ pedal pulses bilaterally. Skin: warm and dry without rash  EKG:  Atrial fibrillation (ventricular rate 63 bpm) with incomplete right bundle branch block.  Lab Results  Component Value Date   WBC 4.6 08/17/2016   HGB 11.8 (L) 08/17/2016   HCT 35.8 (L) 08/17/2016   MCV 95.2 08/17/2016   PLT 168 08/17/2016    Lab Results  Component Value Date   NA 140 08/17/2016   K 3.9 08/17/2016   CL 103 08/17/2016   CO2 25 08/17/2016   BUN 21 08/17/2016   CREATININE 1.51 (H) 08/17/2016   GLUCOSE 97 08/17/2016   ALT 16 05/20/2016    Lab Results  Component Value Date   CHOL 129 05/20/2016   HDL 50.70 05/20/2016   LDLCALC 55 05/20/2016   TRIG 118.0 05/20/2016   CHOLHDL 3 05/20/2016    --------------------------------------------------------------------------------------------------  ASSESSMENT AND PLAN: Permanent atrial fibrillation Tony Reed's heart rate is adequately controlled without AV nodal blocking agents. He is tolerating rivaroxaban well, which he should continue indefinitely for stroke prophylaxis.  Coronary artery disease No chest pain to suggest worsening coronary  insufficiency. There is a report of increased exertional dyspnea, which is nonspecific and could be due to a number of factors. We will continue his current regimen for secondary prevention. He has not on aspirin due to anticoagulation with rivaroxaban.  Heart failure with preserved ejection fraction with shortness of breath LVEF previously noted to be normal but in the setting trivial aortic regurgitation and moderate tricuspid regurgitation. Systolic and diastolic murmurs are noted on exam today. We will obtain a transthoracic echocardiogram for further characterization, given that his last scan was not 2013. I will also check a CBC and complete metabolic panel today given his dyspnea, history of chronic kidney disease, and anticoagulation.  Hypertension Blood pressure is normal today. No medication changes at this time.  Hyperlipidemia LDL and 05/2016 was at goal. Continue with atorvastatin.  Follow-up: Return to clinic in 3 months, sooner significant lab or echo abnormalities are identified.  Yvonne Kendallhristopher Maris Abascal, MD 03/24/2017 8:47 PM

## 2017-03-25 LAB — CBC WITH DIFFERENTIAL/PLATELET
BASOS ABS: 0 10*3/uL (ref 0.0–0.2)
Basos: 1 %
EOS (ABSOLUTE): 0.1 10*3/uL (ref 0.0–0.4)
Eos: 2 %
HEMOGLOBIN: 12 g/dL — AB (ref 13.0–17.7)
Hematocrit: 35.9 % — ABNORMAL LOW (ref 37.5–51.0)
Immature Grans (Abs): 0 10*3/uL (ref 0.0–0.1)
Immature Granulocytes: 0 %
LYMPHS ABS: 1.2 10*3/uL (ref 0.7–3.1)
Lymphs: 29 %
MCH: 31.8 pg (ref 26.6–33.0)
MCHC: 33.4 g/dL (ref 31.5–35.7)
MCV: 95 fL (ref 79–97)
MONOCYTES: 11 %
MONOS ABS: 0.5 10*3/uL (ref 0.1–0.9)
Neutrophils Absolute: 2.4 10*3/uL (ref 1.4–7.0)
Neutrophils: 57 %
PLATELETS: 194 10*3/uL (ref 150–379)
RBC: 3.77 x10E6/uL — AB (ref 4.14–5.80)
RDW: 14.4 % (ref 12.3–15.4)
WBC: 4.1 10*3/uL (ref 3.4–10.8)

## 2017-03-25 LAB — COMPREHENSIVE METABOLIC PANEL
ALK PHOS: 72 IU/L (ref 39–117)
ALT: 16 IU/L (ref 0–44)
AST: 24 IU/L (ref 0–40)
Albumin/Globulin Ratio: 1.5 (ref 1.2–2.2)
Albumin: 4.5 g/dL (ref 3.5–4.7)
BILIRUBIN TOTAL: 0.8 mg/dL (ref 0.0–1.2)
BUN/Creatinine Ratio: 14 (ref 10–24)
BUN: 20 mg/dL (ref 8–27)
CHLORIDE: 102 mmol/L (ref 96–106)
CO2: 27 mmol/L (ref 18–29)
CREATININE: 1.47 mg/dL — AB (ref 0.76–1.27)
Calcium: 9.5 mg/dL (ref 8.6–10.2)
GFR calc Af Amer: 49 mL/min/{1.73_m2} — ABNORMAL LOW (ref >59)
GFR calc non Af Amer: 42 mL/min/{1.73_m2} — ABNORMAL LOW (ref >59)
GLUCOSE: 99 mg/dL (ref 65–99)
Globulin, Total: 3 g/dL (ref 1.5–4.5)
Potassium: 3.9 mmol/L (ref 3.5–5.2)
Sodium: 143 mmol/L (ref 134–144)
Total Protein: 7.5 g/dL (ref 6.0–8.5)

## 2017-03-28 ENCOUNTER — Telehealth: Payer: Self-pay

## 2017-03-28 NOTE — Telephone Encounter (Signed)
Tony RoutLaura Reed pts daugter (do not see DPR signed) left v/m; pt has altzheimers and needs an FL2 form filled out before pt can get medicaid for assistance to get pt in memory care home. Pt last seen 05/20/16; no future appt scheduled. Presently [facility] fx leg. Vernona RiegerLaura said ok to get cb after Dr Lianne Bushyuncan's return on 03/31/17.

## 2017-03-30 ENCOUNTER — Ambulatory Visit (INDEPENDENT_AMBULATORY_CARE_PROVIDER_SITE_OTHER): Payer: Medicare Other | Admitting: Podiatry

## 2017-03-30 ENCOUNTER — Encounter: Payer: Self-pay | Admitting: Podiatry

## 2017-03-30 DIAGNOSIS — B351 Tinea unguium: Secondary | ICD-10-CM

## 2017-03-30 DIAGNOSIS — Q828 Other specified congenital malformations of skin: Secondary | ICD-10-CM | POA: Diagnosis not present

## 2017-03-30 DIAGNOSIS — M79676 Pain in unspecified toe(s): Secondary | ICD-10-CM

## 2017-03-31 NOTE — Progress Notes (Signed)
Patient ID: Tony Reed, male   DOB: Mar 08, 1926, 83 y.o.   MRN: [id number]   Subjective: This patient presents with his daughter present in the treatment room who is requesting debridement of her father's thick, elongated, deformed toenails on the right and left feet. The patient was last evaluated and treated for this similar problem on the visit of 05/12/2016 and was instructed to return in 4 months from that visit, however, does not present to office until today.  Objective:   Patient appears confused and has difficulty answering questions  Vascular: No peripheral edema noted bilaterally Varicosities ankles and dorsal feet bilaterally DP pulses 2/4 bilaterally PT pulses 1/4 bilaterally Capillary reflex immediate bilaterally  Neurological: Patient has difficulty responding Sensation to 10 g monofilament wire intact 4/5 bilaterally Ankle reflexes reactive bilaterally Vibratory sensation reactive bilaterally  Dermatological: The toenails are extremely elongated, curling into the distal toes or adjacent toes. The nails are hypertrophic, discolored and tender to direct palpation 6-10 callus right hallux with driy blood within the callus  Musculoskeletal: Adductovarus third right toe Mallet toe second left This is no restriction ankle, subtalar, midtarsal joints bilaterally  Assessment: Patient appears confused Extremely neglected symptomatic onychomycoses 6-10 Hyperkeratoses right hallux  Plan: I made patient and daughter wear that the nail thickening was chronic and recommended ongoing debridement. Debridement toenails 10 mechanically an electrical without any bleeding. Debride callus right hallux without any bleeding   reappoint 4 months

## 2017-03-31 NOTE — Telephone Encounter (Signed)
Laura advised.

## 2017-03-31 NOTE — Telephone Encounter (Signed)
I can work on the [employer].  Let them know I'll do it ASAP.  We'll notify them when done.

## 2017-04-03 NOTE — Telephone Encounter (Signed)
Form done, as much as I can do.  Please print med list and attach to FL2.  Thanks.  Please scan a copy of the FL2.

## 2017-04-04 ENCOUNTER — Encounter: Payer: Self-pay | Admitting: *Deleted

## 2017-04-04 NOTE — Telephone Encounter (Signed)
Nelly RoutLaura Brown (daughter) advised and [facility].  Mailed, copy made for scanning.

## 2017-04-07 ENCOUNTER — Other Ambulatory Visit: Payer: Self-pay

## 2017-04-07 ENCOUNTER — Ambulatory Visit (HOSPITAL_COMMUNITY): Payer: Medicare Other | Attending: Cardiology

## 2017-04-07 DIAGNOSIS — R0602 Shortness of breath: Secondary | ICD-10-CM | POA: Insufficient documentation

## 2017-04-07 DIAGNOSIS — I4891 Unspecified atrial fibrillation: Secondary | ICD-10-CM | POA: Insufficient documentation

## 2017-05-12 ENCOUNTER — Other Ambulatory Visit: Payer: Self-pay | Admitting: Family Medicine

## 2017-05-12 NOTE — Telephone Encounter (Signed)
Left detailed message on voicemail.  

## 2017-05-12 NOTE — Telephone Encounter (Signed)
Daughter advised and will schedule 30 min OV at a later time.  Daughter says she does not think this medication is helping and they may discontinue it.

## 2017-05-12 NOTE — Telephone Encounter (Signed)
Electronic refill request. Memantadine Last office visit:   05/20/16 Last Filled:    60 tablet 3 03/08/2017  Please advise.

## 2017-05-12 NOTE — Telephone Encounter (Signed)
 f/u when possible.   Sent.  Thanks.

## 2017-05-12 NOTE — Telephone Encounter (Signed)
This is a reasonable point and one that I was going to raise at the f/u OV.  If not benefit, then okay to stop but I would taper down to 1 tab a day for 2 weeks, then stop.  Thanks.

## 2017-05-19 ENCOUNTER — Other Ambulatory Visit: Payer: Self-pay | Admitting: Cardiology

## 2017-05-19 NOTE — Telephone Encounter (Signed)
Followed by Dr End 

## 2017-05-25 ENCOUNTER — Ambulatory Visit: Payer: Self-pay | Admitting: Family Medicine

## 2017-06-01 ENCOUNTER — Ambulatory Visit (INDEPENDENT_AMBULATORY_CARE_PROVIDER_SITE_OTHER): Payer: Medicare Other | Admitting: Family Medicine

## 2017-06-01 ENCOUNTER — Encounter: Payer: Self-pay | Admitting: Family Medicine

## 2017-06-01 VITALS — BP 140/78 | HR 64 | Temp 98.7°F | Wt 181.2 lb

## 2017-06-01 DIAGNOSIS — E538 Deficiency of other specified B group vitamins: Secondary | ICD-10-CM

## 2017-06-01 DIAGNOSIS — D509 Iron deficiency anemia, unspecified: Secondary | ICD-10-CM | POA: Diagnosis not present

## 2017-06-01 DIAGNOSIS — F0390 Unspecified dementia without behavioral disturbance: Secondary | ICD-10-CM

## 2017-06-01 DIAGNOSIS — I5030 Unspecified diastolic (congestive) heart failure: Secondary | ICD-10-CM

## 2017-06-01 DIAGNOSIS — Z7189 Other specified counseling: Secondary | ICD-10-CM

## 2017-06-01 DIAGNOSIS — E785 Hyperlipidemia, unspecified: Secondary | ICD-10-CM

## 2017-06-01 DIAGNOSIS — I482 Chronic atrial fibrillation: Secondary | ICD-10-CM

## 2017-06-01 DIAGNOSIS — I4821 Permanent atrial fibrillation: Secondary | ICD-10-CM

## 2017-06-01 NOTE — Patient Instructions (Signed)
Don't change your meds for now.  Update me if you notice any changes or have concerns.   Thank you for your effort with supervision and care. I appreciate your effort.  Take care.  Glad to see you.

## 2017-06-01 NOTE — Progress Notes (Signed)
Here today with his daughter. Patient is able to give very few details due to his dementia. Most of the history is per his daughter.  His wife died earlier this summer.  Family is helping out, living with patient.  He is still at home.  He has supervision at home.  Memory loss continues.  He has been walking some, with supervision.  Effect from namenda was limited, he had diarrhea on the med.  He was weaned off the med and the diarrhea got better. No did not seem to be an acute worsening of his memory with stoppage of Namenda. His memory loss has progressed to the point where he does not always remember that his wife has died. Appetite is okay.  No acute neurologic changes, no weakness, no gait changes. No dysphagia.  H/o CHF.  Still in statin and BP meds.  No ADE on meds, no complaints of aches on statin.  Prev echo results d/w pt and family.  Some occ SOB but this is not apparently worsening.  No BLE edema.    Still anticoagulated, still on iron.  No falls recently, likely reasonable to continue anticoagulation for now.  D/w pt and family.    Advance directive d/w pt.  patient does not consistently remember that his wife has died. He would not be able to make informed choices about his healthcare, due to his dementia. He has 4 children. Assuming no one of those four is designated as a Occupational psychologistshingle healthcare power of attorney, then decisions would be amongst them. Discussed with patient's daughter. She will notify her siblings.   History of B12 deficiency. On replacement.  PMH and SH reviewed  ROS: Per HPI unless specifically indicated in ROS section   Meds, vitals, and allergies reviewed.   GEN: nad, alert, not oriented, pleasant in conversation HEENT: mucous membranes moist NECK: supple w/o LA CV: IRR not tachy PULM: ctab, no inc wob ABD: soft, +bs EXT: no edema SKIN: no acute rash

## 2017-06-02 ENCOUNTER — Encounter: Payer: Self-pay | Admitting: Family Medicine

## 2017-06-02 NOTE — Assessment & Plan Note (Signed)
History of, continue iron. Most recent CBC was stable. Would not pursue aggressive workup due to his dementia.

## 2017-06-02 NOTE — Assessment & Plan Note (Signed)
Advance directive d/w pt.  patient does not consistently remember that his wife has died. He would not be able to make informed choices about his healthcare, due to his dementia. He has 4 children. Assuming no one of those four is designated as a Occupational psychologistshingle healthcare power of attorney, then decisions would be amongst them. Discussed with patient's daughter. She will notify her siblings.

## 2017-06-02 NOTE — Assessment & Plan Note (Signed)
Apparently euvolemic. Would continue current medications.

## 2017-06-02 NOTE — Assessment & Plan Note (Signed)
No known adverse effect on statin. Not complaining of muscle aches. Would continue. He was not fasting so I did not put him through repeat labs at this point.

## 2017-06-02 NOTE — Assessment & Plan Note (Signed)
Would still continue replacement.

## 2017-06-02 NOTE — Assessment & Plan Note (Addendum)
Diarrhea is better off Namenda. Would continue off medication. Would not start other dementia treatment at this point since he was not having any beneficial effect from Namenda recently. Daughter agrees. Daughter and I talked, I expect his memory loss to progress. He is still ambulatory and able to communicate. This may change slowly. Continue supervision. Update me as needed. >25 minutes spent in face to face time with patient, >50% spent in counselling or coordination of care.

## 2017-06-02 NOTE — Assessment & Plan Note (Signed)
Not tachycardia. Would continue anticoagulation unless he had a contraindication or falls. Discussed with daughter. She agrees.

## 2017-06-17 ENCOUNTER — Encounter: Payer: Self-pay | Admitting: *Deleted

## 2017-07-08 ENCOUNTER — Encounter: Payer: Self-pay | Admitting: Internal Medicine

## 2017-07-08 ENCOUNTER — Ambulatory Visit (INDEPENDENT_AMBULATORY_CARE_PROVIDER_SITE_OTHER): Payer: Medicare Other | Admitting: Internal Medicine

## 2017-07-08 ENCOUNTER — Encounter (INDEPENDENT_AMBULATORY_CARE_PROVIDER_SITE_OTHER): Payer: Self-pay

## 2017-07-08 VITALS — BP 110/52 | HR 63 | Ht 68.0 in | Wt 179.2 lb

## 2017-07-08 DIAGNOSIS — E785 Hyperlipidemia, unspecified: Secondary | ICD-10-CM | POA: Diagnosis not present

## 2017-07-08 DIAGNOSIS — I2510 Atherosclerotic heart disease of native coronary artery without angina pectoris: Secondary | ICD-10-CM | POA: Diagnosis not present

## 2017-07-08 DIAGNOSIS — I5030 Unspecified diastolic (congestive) heart failure: Secondary | ICD-10-CM

## 2017-07-08 DIAGNOSIS — I10 Essential (primary) hypertension: Secondary | ICD-10-CM | POA: Diagnosis not present

## 2017-07-08 DIAGNOSIS — I482 Chronic atrial fibrillation: Secondary | ICD-10-CM | POA: Diagnosis not present

## 2017-07-08 DIAGNOSIS — I4821 Permanent atrial fibrillation: Secondary | ICD-10-CM

## 2017-07-08 NOTE — Progress Notes (Signed)
Follow-up Outpatient Visit Date: 07/08/2017  Primary Care Provider: Joaquim Nam, MD [address] [place] [address]  Chief Complaint: Dyspnea on exertion  HPI:  Tony Reed is a 83 y.o. year-old male with history of coronary artery disease, diastolic heart failure, permanent atrial fibrillation, and dementia, who presents for follow-up of chronic dyspnea on exertion. I last saw him in June, at which Tony Reed reported feeling well. One of his daughters did not feel as though he seemed a little bit more short of breath when walking extended distances. Subsequent echo showed preserved LV function with trivial aortic and mitral regurgitation and moderate tricuspid regurgitation. Overall, the appearance was not significant change from previous study.  Tony Reed reports stable mild exertional shortness of breath. He has not had any chest pain, palpitations, lightheadedness, or edema. He denies bleeding and falls, remaining on rivaroxaban. He was recently weaned off Namenda by his PCP due to diarrhea after starting this medication.  -------------------------------------------------------------------------------------------------- Past Medical History:  Diagnosis Date  . CAD (coronary artery disease)    inferior MI in 2003 w BMS to RCA. DES to CFX in 2008  . CHF (congestive heart failure) (HCC)    diastolic: Echo (4/11) w EF 50-60%, moderate MR, moderate to sever LAE, severe RAE, moderate to sever TR  . CKD (chronic kidney disease)   . Diverticulitis, colon   . GERD (gastroesophageal reflux disease)   . Hyperlipidemia   . Hypertension   . Leg edema    L>R chronically  . Leg pain    and decrease pulses consistent w peripheral vasc disease  . Memory difficulty   . Osteoporosis    s/p 5+ years of fosamax treatment as of 2013  . Paroxysmal atrial fibrillation (HCC)    w history of DCCV   Past Surgical History:  Procedure Laterality Date  . adenosine cardiolite   10/03/02   old infarct inf o/w negative EF 61% 10/03/02  . CARDIAC CATHETERIZATION  1986   normal  . CARDIOVERSION  02/03/10   successful on Mutaq (Dr. Juanda Chance)   . Cath  06/25/09   nonobstructive disease afib (Dr. Juanda Chance)  . Cath, stents  09/27/02  . choleycystectomy  1996  . COLONOSCOPY  12/13/08   mod divertics no polyps (Dr Juanda Chance)  . CT  11/21/03   abd mod H. H., CT pelvis elevated prostate  . CYSTOSCOPY  09/26/03   Vonita Moss  . Echo multiple findings  01/18/10  . ESOPHAGOGASTRODUODENOSCOPY  12/05/03   postiive H-Pylori, H.H., stricture   . FLEXIBLE SIGMOIDOSCOPY  1/98  . hemmoroidectoy    . herniorraphy     x2  . Hosp[facility] R/O  01/06/04  . poypectomy    . stent in distal circum  04/17/02    Current Meds  Medication Sig  . atorvastatin (LIPITOR) 80 MG tablet TAKE 1 TABLET BY MOUTH EVERY DAY  . ferrous sulfate 325 (65 FE) MG tablet Take 325 mg by mouth daily with breakfast.    . furosemide (LASIX) 40 MG tablet TAKE 2 TABLETS BY MOUTH DAILY  . potassium chloride SA (K-DUR,KLOR-CON) 20 MEQ tablet Take 1 tablet (20 mEq total) by mouth 2 (two) times daily.  . ramipril (ALTACE) 2.5 MG capsule TAKE 1 CAPSULE(2.5 MG) BY MOUTH DAILY  . vitamin B-12 (CYANOCOBALAMIN) 1000 MCG tablet Take 1 tablet (1,000 mcg total) by mouth daily.  Tony Reed 15 MG TABS tablet TAKE 1 TABLET(15 MG) BY MOUTH DAILY WITH SUPPER  Allergies: Namenda [memantine hcl]; Naproxen; and Penicillins  Social History   Social History  . Marital status: Married    Spouse name: N/A  . Number of children: 6  . Years of education: N/A   Occupational History  .  Retired   Social History Main Topics  . Smoking status: Never Smoker  . Smokeless tobacco: Never Used  . Alcohol use No  . Drug use: No  . Sexual activity: Yes   Other Topics Concern  . Not on file   Social History Narrative   Retired. Lives w/son in [place].    Wife died 03-03-17- due to dementia, patient may not recall that his wife has died     Family History  Problem Relation Age of Onset  . Hypertension Mother   . Leukemia Mother   . Lung cancer Brother        smoking  . Heart Problems Brother        smoker  . Deafness Brother     Review of Systems: A 12-system review of systems was performed and was negative except as noted in the HPI.  --------------------------------------------------------------------------------------------------  Physical Exam: BP (!) 110/52   Pulse 63   Ht  (1.727 m)   Wt 179 lb 3.2 oz (81.3 kg)   SpO2 97%   BMI 27.25 kg/m   General:  Overweight, elderly man, seated comfortably in the exam room. He is accompanied by his daughter. HEENT: No conjunctival pallor or scleral icterus. Moist mucous membranes.  OP clear. Neck: Supple without lymphadenopathy, thyromegaly, JVD, or HJR. Lungs: Normal work of breathing. Clear to auscultation bilaterally without wheezes or crackles. Heart: Irregularly irregular without murmurs. Non-displaced PMI. Abd: Bowel sounds present. Soft, NT/ND without hepatosplenomegaly Ext: No lower extremity edema. Radial, PT, and DP pulses are 2+ bilaterally. Skin: Warm and dry without rash.  Lab Results  Component Value Date   WBC 4.1 03/24/2017   HGB 12.0 (L) 03/24/2017   HCT 35.9 (L) 03/24/2017   MCV 95 03/24/2017   PLT 194 03/24/2017    Lab Results  Component Value Date   NA 143 03/24/2017   K 3.9 03/24/2017   CL 102 03/24/2017   CO2 27 03/24/2017   BUN 20 03/24/2017   CREATININE 1.47 (H) 03/24/2017   GLUCOSE 99 03/24/2017   ALT 16 03/24/2017    Lab Results  Component Value Date   CHOL 129 05/20/2016   HDL 50.70 05/20/2016   LDLCALC 55 05/20/2016   TRIG 118.0 05/20/2016   CHOLHDL 3 05/20/2016    --------------------------------------------------------------------------------------------------  ASSESSMENT AND PLAN: HFpEF Stable exertional dyspnea. [name] appears euvolemic on exam. No medication changes at this time.  Permanent  atrial fibrillation Heart rate acceptable off nodal blocking agents. We will continue with indefinite rivaroxaban (dose-adjusted for GFR < 50).  Coronary artery disease No angina. Stable DOE is non-specific. We will continue with secondary prevention. [name] is not on aspirin due to anticoagulation with rivaroxaban.  Hypertension Blood pressure is well-controlled today. Continue current dose of ramipril.  Hyperlipidemia Goal LDL < 70; LDL 55 last year. We will repeat a fasting lipid panel at Mr. Besecker's convenience. Continue current dose of atorvastatin for now.  Follow-up: Return to clinic in 6 months.  Yvonne Kendall, MD 07/08/2017 3:32 PM

## 2017-07-08 NOTE — Patient Instructions (Signed)
Medication Instructions:  Your physician recommends that you continue on your current medications as directed. Please refer to the Current Medication list given to you today.  Labwork: Your physician recommends that you return for a FASTING lipid profile. This is scheduled for Wednesday July 20, 2017. The lab is open from 7:30 AM- 5PM. Do not eat or drink after midnight the night before you come for your lab.   Testing/Procedures: None   Follow-Up: Your physician wants you to follow-up in: 6 months with Dr End. (March 2019).  You will receive a reminder letter in the mail two months in advance. If you don't receive a letter, please call our office to schedule the follow-up appointment.        If you need a refill on your cardiac medications before your next appointment, please call your pharmacy.

## 2017-07-20 ENCOUNTER — Other Ambulatory Visit: Payer: Medicare Other

## 2017-07-20 DIAGNOSIS — E785 Hyperlipidemia, unspecified: Secondary | ICD-10-CM | POA: Diagnosis not present

## 2017-07-20 LAB — LIPID PANEL
CHOLESTEROL TOTAL: 118 mg/dL (ref 100–199)
Chol/HDL Ratio: 2.5 ratio (ref 0.0–5.0)
HDL: 47 mg/dL (ref >39)
LDL Calculated: 55 mg/dL (ref 0–99)
TRIGLYCERIDES: 81 mg/dL (ref 0–149)
VLDL Cholesterol Cal: 16 mg/dL (ref 5–40)

## 2017-07-29 ENCOUNTER — Other Ambulatory Visit: Payer: Self-pay | Admitting: *Deleted

## 2017-07-29 DIAGNOSIS — I4819 Other persistent atrial fibrillation: Secondary | ICD-10-CM

## 2017-07-29 DIAGNOSIS — I10 Essential (primary) hypertension: Secondary | ICD-10-CM

## 2017-07-29 DIAGNOSIS — I2510 Atherosclerotic heart disease of native coronary artery without angina pectoris: Secondary | ICD-10-CM

## 2017-07-29 MED ORDER — RAMIPRIL 2.5 MG PO CAPS: ORAL_CAPSULE | ORAL | 1 refills | Status: DC

## 2017-08-02 ENCOUNTER — Ambulatory Visit: Payer: Medicare Other | Admitting: Podiatry

## 2017-08-03 ENCOUNTER — Ambulatory Visit: Payer: Medicare Other | Admitting: Podiatry

## 2017-08-11 ENCOUNTER — Other Ambulatory Visit: Payer: Self-pay | Admitting: Cardiology

## 2017-08-15 ENCOUNTER — Ambulatory Visit (INDEPENDENT_AMBULATORY_CARE_PROVIDER_SITE_OTHER): Payer: Medicare Other | Admitting: Podiatry

## 2017-08-15 ENCOUNTER — Encounter: Payer: Self-pay | Admitting: Podiatry

## 2017-08-15 DIAGNOSIS — B351 Tinea unguium: Secondary | ICD-10-CM

## 2017-08-15 DIAGNOSIS — M79676 Pain in unspecified toe(s): Secondary | ICD-10-CM

## 2017-08-15 NOTE — Progress Notes (Signed)
Patient ID: Tony Reed, male   DOB: 06-Sep-1926, 83 y.o.   MRN: [id number]     Subjective: This patient presents with his daughter present in the treatment room who is requesting debridement of her father's thick, elongated, deformed toenails on the right and left feet. Patient is confused and daughter responds to questioning. In the past patient has had extremely neglected deformed toenails which were difficult for patient or patient's daughter to trim.  Objective:   Patient appears confused and has difficulty answering questions  Vascular: No peripheral edema noted bilaterally Varicosities ankles and dorsal feet bilaterally DP pulses 2/4 bilaterally PT pulses 1/4 bilaterally Capillary reflex immediate bilaterally  Neurological: Patient has difficulty responding Sensation to 10 g monofilament wire intact 4/5 bilaterally Ankle reflexes reactive bilaterally Vibratory sensation reactive bilaterally  Dermatological: The toenails are elongated, curling into the distal toes or adjacent toes. The nails are hypertrophic, discolored and tender to direct palpation 6-10   Musculoskeletal: Adductovarus third right toe Mallet toe second left This is no restriction ankle, subtalar, midtarsal joints bilaterally  Assessment: Patient appears confused  symptomatic onychomycoses 6-10  Plan: Debridement toenails 6-10 mechanically and electrically without any bleeding  Reappoint 3 months

## 2017-09-04 ENCOUNTER — Other Ambulatory Visit: Payer: Self-pay

## 2017-09-04 ENCOUNTER — Emergency Department (HOSPITAL_COMMUNITY): Payer: Medicare Other

## 2017-09-04 ENCOUNTER — Emergency Department (HOSPITAL_COMMUNITY)
Admission: EM | Admit: 2017-09-04 | Discharge: 2017-09-05 | Disposition: A | Discharge status: Home / Self Care | Payer: Medicare Other | Attending: Emergency Medicine | Admitting: Emergency Medicine

## 2017-09-04 ENCOUNTER — Encounter (HOSPITAL_COMMUNITY): Payer: Self-pay | Admitting: Emergency Medicine

## 2017-09-04 DIAGNOSIS — I130 Hypertensive heart and chronic kidney disease with heart failure and stage 1 through stage 4 chronic kidney disease, or unspecified chronic kidney disease: Secondary | ICD-10-CM | POA: Insufficient documentation

## 2017-09-04 DIAGNOSIS — R319 Hematuria, unspecified: Secondary | ICD-10-CM | POA: Insufficient documentation

## 2017-09-04 DIAGNOSIS — R031 Nonspecific low blood-pressure reading: Secondary | ICD-10-CM | POA: Diagnosis not present

## 2017-09-04 DIAGNOSIS — I5032 Chronic diastolic (congestive) heart failure: Secondary | ICD-10-CM | POA: Insufficient documentation

## 2017-09-04 DIAGNOSIS — Z7901 Long term (current) use of anticoagulants: Secondary | ICD-10-CM | POA: Insufficient documentation

## 2017-09-04 DIAGNOSIS — R4020 Unspecified coma: Secondary | ICD-10-CM | POA: Diagnosis not present

## 2017-09-04 DIAGNOSIS — R410 Disorientation, unspecified: Secondary | ICD-10-CM | POA: Diagnosis not present

## 2017-09-04 DIAGNOSIS — Z79899 Other long term (current) drug therapy: Secondary | ICD-10-CM | POA: Insufficient documentation

## 2017-09-04 DIAGNOSIS — R4182 Altered mental status, unspecified: Secondary | ICD-10-CM | POA: Diagnosis present

## 2017-09-04 DIAGNOSIS — I2510 Atherosclerotic heart disease of native coronary artery without angina pectoris: Secondary | ICD-10-CM | POA: Diagnosis not present

## 2017-09-04 DIAGNOSIS — N189 Chronic kidney disease, unspecified: Secondary | ICD-10-CM | POA: Diagnosis not present

## 2017-09-04 DIAGNOSIS — J9811 Atelectasis: Secondary | ICD-10-CM | POA: Diagnosis not present

## 2017-09-04 LAB — CBC WITH DIFFERENTIAL/PLATELET
BASOS ABS: 0 10*3/uL (ref 0.0–0.1)
BASOS PCT: 1 %
EOS ABS: 0.1 10*3/uL (ref 0.0–0.7)
Eosinophils Relative: 2 %
HEMATOCRIT: 36.5 % — AB (ref 39.0–52.0)
HEMOGLOBIN: 11.7 g/dL — AB (ref 13.0–17.0)
Lymphocytes Relative: 36 %
Lymphs Abs: 1.5 10*3/uL (ref 0.7–4.0)
MCH: 31.8 pg (ref 26.0–34.0)
MCHC: 32.1 g/dL (ref 30.0–36.0)
MCV: 99.2 fL (ref 78.0–100.0)
Monocytes Absolute: 0.4 10*3/uL (ref 0.1–1.0)
Monocytes Relative: 10 %
NEUTROS ABS: 2.1 10*3/uL (ref 1.7–7.7)
NEUTROS PCT: 51 %
Platelets: 138 10*3/uL — ABNORMAL LOW (ref 150–400)
RBC: 3.68 MIL/uL — AB (ref 4.22–5.81)
RDW: 13.4 % (ref 11.5–15.5)
WBC: 4.1 10*3/uL (ref 4.0–10.5)

## 2017-09-04 LAB — COMPREHENSIVE METABOLIC PANEL
ALBUMIN: 4 g/dL (ref 3.5–5.0)
ALK PHOS: 70 U/L (ref 38–126)
ALT: 14 U/L — ABNORMAL LOW (ref 17–63)
ANION GAP: 9 (ref 5–15)
AST: 26 U/L (ref 15–41)
BILIRUBIN TOTAL: 1.1 mg/dL (ref 0.3–1.2)
BUN: 15 mg/dL (ref 6–20)
CALCIUM: 9.3 mg/dL (ref 8.9–10.3)
CO2: 23 mmol/L (ref 22–32)
Chloride: 108 mmol/L (ref 101–111)
Creatinine, Ser: 1.23 mg/dL (ref 0.61–1.24)
GFR calc Af Amer: 59 mL/min — ABNORMAL LOW (ref >60)
GFR, EST NON AFRICAN AMERICAN: 51 mL/min — AB (ref >60)
GLUCOSE: 89 mg/dL (ref 65–99)
POTASSIUM: 4 mmol/L (ref 3.5–5.1)
Sodium: 140 mmol/L (ref 135–145)
TOTAL PROTEIN: 7.3 g/dL (ref 6.5–8.1)

## 2017-09-04 LAB — URINALYSIS, ROUTINE W REFLEX MICROSCOPIC
Bacteria, UA: NONE SEEN
Bilirubin Urine: NEGATIVE
GLUCOSE, UA: NEGATIVE mg/dL
Ketones, ur: NEGATIVE mg/dL
Leukocytes, UA: NEGATIVE
NITRITE: NEGATIVE
PROTEIN: NEGATIVE mg/dL
SPECIFIC GRAVITY, URINE: 1.009 (ref 1.005–1.030)
Squamous Epithelial / LPF: NONE SEEN
pH: 7 (ref 5.0–8.0)

## 2017-09-04 LAB — PROTIME-INR
INR: 1.21
PROTHROMBIN TIME: 15.2 s (ref 11.4–15.2)

## 2017-09-04 LAB — I-STAT TROPONIN, ED: TROPONIN I, POC: 0.01 ng/mL (ref 0.00–0.08)

## 2017-09-04 LAB — BRAIN NATRIURETIC PEPTIDE: B Natriuretic Peptide: 461.5 pg/mL — ABNORMAL HIGH (ref 0.0–100.0)

## 2017-09-04 NOTE — ED Triage Notes (Addendum)
Report from [facility]> pt lives with son.  Family reported pt was not acting right today.  States they couldn't get him to eat and he "wasn't doing anything."  HR 40-70 afib per EMS.  PT alert and following commands on arrival.  Denies any complaints.  Pt joking with staff.  Pt alert to person, place, and month.  Did not remember the year.  MAE without difficulty.

## 2017-09-04 NOTE — ED Notes (Signed)
Pt asking about his wife.  Family reminded him that she passed away in June.

## 2017-09-04 NOTE — ED Notes (Signed)
Family here and states that pt was LKW yesterday.  States he got up a couple times to use bathroom today.  Son tried to wake him up tonight to eat because he had not ate all day and was unable to get him to respond.  Daughter went to house and states that he would just shake his head and wouldn't get up.  EMS was able to get patient to talk and sit up.

## 2017-09-04 NOTE — ED Notes (Signed)
Pt to CT

## 2017-09-04 NOTE — ED Provider Notes (Signed)
[facility]   CSN: [id number] Arrival date & time: 09/04/17  2028     History   Chief Complaint Chief Complaint  Patient presents with  . Altered Mental Status    HPI Tony Reed is a 83 y.o. male.  HPI Patient is brought by EMS from home.  At this time, family members are not yet present.  EMS report is that per family patient has not been eating well and has had increased confusion, apparently "not doing anything".  Per EMS heart rate has been atrial fibrillation ranging from 40-70.  Patient was alert and following commands on their arrival.  The patient does not have any complaints.  He reports he feels is fine as he always does.  He does not understand why family thought he needed to be checked but maybe there was something that they noticed.  Patient is jovial and interactive.  He does have dementia and has some memory problems.  He however was able to identify November as the month although he did not know the year.  He could identify the street that he lives on but not the street number.  Regarding not eating as identified in the triage note, patient stated he did not know about that but "maybe they (his family) found some food that he had thrown away." Past Medical History:  Diagnosis Date  . CAD (coronary artery disease)    inferior MI in 2003 w BMS to RCA. DES to CFX in 2008  . CHF (congestive heart failure) (HCC)    diastolic: Echo (4/11) w EF 50-60%, moderate MR, moderate to sever LAE, severe RAE, moderate to sever TR  . CKD (chronic kidney disease)   . Diverticulitis, colon   . GERD (gastroesophageal reflux disease)   . Hyperlipidemia   . Hypertension   . Leg edema    L>R chronically  . Leg pain    and decrease pulses consistent w peripheral vasc disease  . Memory difficulty   . Osteoporosis    s/p 5+ years of fosamax treatment as of 2013  . Paroxysmal atrial fibrillation (HCC)    w history of DCCV     Patient Active Problem List   Diagnosis Date Noted  . (HFpEF) heart failure with preserved ejection fraction (HCC) 03/24/2017  . Cough 10/27/2015  . Long toenail 05/16/2015  . Advance care planning 05/16/2015  . Dementia without behavioral disturbance 05/16/2015  . Encounter for therapeutic drug monitoring 12/05/2013  . Orthostasis 04/21/2012  . Orthostatic hypotension 03/14/2012  . Syncope 03/09/2012  . Osteoporosis 02/11/2012  . Memory loss 08/11/2011  . Hyperlipidemia 03/31/2011  . DIASTOLIC HEART FAILURE, CHRONIC 11/26/2010  . DIASTOLIC HEART FAILURE, ACUTE ON CHRONIC 07/24/2010  . Atrial fibrillation (HCC) 06/25/2009  . CARDIOVASCULAR FUNCTION STUDY, ABNORMAL 06/20/2009  . CAD, NATIVE VESSEL 06/13/2009  . UNSPECIFIED VITAMIN D DEFICIENCY 01/29/2009  . Edema 01/22/2008  . TESTOSTERONE DEFICIENCY 02/22/2007  . B12 deficiency 02/22/2007  . ANEMIA DUE TO CHRONIC BLOOD LOSS 02/22/2007  . ANEMIA, IRON DEFICIENCY 02/22/2007  . CATARACT, LEFT EYE 02/22/2007  . Essential hypertension 02/22/2007  . DIVERTICULOSIS, COLON 02/22/2007  . BENIGN PROSTATIC HYPERTROPHY, HX OF 02/22/2007    Past Surgical History:  Procedure Laterality Date  . adenosine cardiolite  10/03/02   old infarct inf o/w negative EF 61% 10/03/02  . CARDIAC CATHETERIZATION  1986   normal  . CARDIOVERSION  02/03/10   successful on Mutaq (Dr. Juanda ChanceBrodie)   . Cath  06/25/09   nonobstructive disease afib (Dr. Juanda ChanceBrodie)  . Cath, stents  09/27/02  . choleycystectomy  1996  . COLONOSCOPY  12/13/08   mod divertics no polyps (Dr Juanda ChanceBrodie)  . CT  11/21/03   abd mod H. H., CT pelvis elevated prostate  . CYSTOSCOPY  09/26/03   Vonita MossPeterson  . Echo multiple findings  01/18/10  . ESOPHAGOGASTRODUODENOSCOPY  12/05/03   postiive H-Pylori, H.H., stricture   . FLEXIBLE SIGMOIDOSCOPY  1/98  . hemmoroidectoy    . herniorraphy     x2  . [facility]osp[facility] R/O  01/06/04  . poypectomy    . stent in distal circum  04/17/02       Home  Medications    Prior to Admission medications   Medication Sig Start Date End Date Taking? Authorizing Provider  atorvastatin (LIPITOR) 80 MG tablet TAKE 1 TABLET BY MOUTH EVERY DAY Patient taking differently: TAKE 1 TABLET (80mg ) BY MOUTH EVERY DAY 08/12/17  Yes Laurey MoraleMcLean, Dalton S, MD  ferrous sulfate 325 (65 FE) MG tablet Take 325 mg by mouth daily with breakfast.     Yes [provider]  furosemide (LASIX) 40 MG tablet TAKE 2 TABLETS BY MOUTH DAILY Patient taking differently: TAKE 2 TABLETS (80mg ) BY MOUTH DAILY 08/12/17  Yes Laurey MoraleMcLean, Dalton S, MD  potassium chloride SA (K-DUR,KLOR-CON) 20 MEQ tablet Take 1 tablet (20 mEq total) by mouth 2 (two) times daily. 09/02/16  Yes Laurey MoraleMcLean, Dalton S, MD  ramipril (ALTACE) 2.5 MG capsule TAKE 1 CAPSULE(2.5 MG) BY MOUTH DAILY 07/29/17  Yes End, Cristal Deerhristopher, MD  vitamin B-12 (CYANOCOBALAMIN) 1000 MCG tablet Take 1 tablet (1,000 mcg total) by mouth daily. 05/20/16  Yes Joaquim Namuncan, Graham S, MD  XARELTO 15 MG TABS tablet TAKE 1 TABLET(15 MG) BY MOUTH DAILY WITH SUPPER 05/19/17  Yes End, Cristal Deerhristopher, MD  cephALEXin (KEFLEX) 500 MG capsule Take 2 capsules (1,000 mg total) 2 (two) times daily by mouth. 09/05/17   Arby BarrettePfeiffer, Durwood Dittus, MD    Family History Family History  Problem Relation Age of Onset  . Hypertension Mother   . Leukemia Mother   . Lung cancer Brother        smoking  . Heart Problems Brother        smoker  . Deafness Brother     Social History Social History   Tobacco Use  . Smoking status: Never Smoker  . Smokeless tobacco: Never Used  Substance Use Topics  . Alcohol use: No  . Drug use: No     Allergies   Namenda [memantine hcl]; Naproxen; and Penicillins   Review of Systems Review of Systems 10 Systems reviewed and are negative for acute change except as noted in the HPI.   Physical Exam Updated Vital Signs BP (!) 153/69 (BP Location: Right Arm)   Pulse (!) 53   Temp 98.3 F (36.8 C) (Oral)   Resp 16   Ht 5\' 11"   (1.803 m)   Wt 79.4 kg (175 lb)   SpO2 100%   BMI 24.41 kg/m   Physical Exam  Constitutional: He appears well-developed and well-nourished.  Patient is alert and cooperative.  No respiratory distress.  HENT:  Head: Normocephalic and atraumatic.  Mouth/Throat: Oropharynx is clear and moist.  Eyes: Conjunctivae and EOM are normal. Pupils are equal, round, and reactive to light.  Neck: Neck supple.  Cardiovascular: Normal rate.  No murmur heard. Pulmonary/Chest: Effort normal and breath sounds normal. No respiratory distress.  Abdominal: Soft. There is no tenderness.  Musculoskeletal: He exhibits no edema.  Neurological: He is alert.  Skin: Skin is warm and dry.  Psychiatric: He has a normal mood and affect.  Nursing note and vitals reviewed.    ED Treatments / Results  Labs (all labs ordered are listed, but only abnormal results are displayed) Labs Reviewed  COMPREHENSIVE METABOLIC PANEL - Abnormal; Notable for the following components:      Result Value   ALT 14 (*)    GFR calc non Af Amer 51 (*)    GFR calc Af Amer 59 (*)    All other components within normal limits  BRAIN NATRIURETIC PEPTIDE - Abnormal; Notable for the following components:   B Natriuretic Peptide 461.5 (*)    All other components within normal limits  CBC WITH DIFFERENTIAL/PLATELET - Abnormal; Notable for the following components:   RBC 3.68 (*)    Hemoglobin 11.7 (*)    HCT 36.5 (*)    Platelets 138 (*)    All other components within normal limits  URINALYSIS, ROUTINE W REFLEX MICROSCOPIC - Abnormal; Notable for the following components:   Hgb urine dipstick LARGE (*)    All other components within normal limits  URINE CULTURE  PROTIME-INR  I-STAT TROPONIN, ED    EKG  EKG Interpretation  Date/Time:  Sunday September 04 2017 20:49:20 EST Ventricular Rate:  50 PR Interval:    QRS Duration: 114 QT Interval:  506 QTC Calculation: 462 R Axis:   120 Text Interpretation:  Age not entered,  assumed to be  83 years old for purpose of ECG interpretation Right and left arm electrode reversal, interpretation assumes no reversal Atrial fibrillation IRBBB and LPFB Probable lateral infarct, age indeterminate Electrode noise Confirmed by Devoria Albe ([id number]) on 09/05/2017 6:23:21 PM       Radiology Dg Chest 2 View  Result Date: 09/04/2017 CLINICAL DATA:  Confusion EXAM: CHEST  2 VIEW COMPARISON:  03/09/2012 FINDINGS: Cardiac shadow is enlarged. Aortic calcifications are again seen. The lungs are well aerated bilaterally. Elevation of the right hemidiaphragm is noted with mild right basilar atelectasis. No focal infiltrate or sizable effusion is seen. Degenerative changes of the thoracic spine are noted. IMPRESSION: Mild right basilar atelectasis. Electronically Signed   By: Alcide Clever M.D.   On: 09/04/2017 21:56   Ct Head Wo Contrast  Result Date: 09/04/2017 CLINICAL DATA:  83 y/o  M; altered level of consciousness. EXAM: CT HEAD WITHOUT CONTRAST TECHNIQUE: Contiguous axial images were obtained from the base of the skull through the vertex without intravenous contrast. COMPARISON:  None. FINDINGS: Brain: No evidence of acute infarction, hemorrhage, hydrocephalus, extra-axial collection or mass lesion/mass effect. Moderate chronic microvascular ischemic changes and parenchymal volume loss of the brain. Vascular: Calcific atherosclerosis of carotid siphons. Skull: Normal. Negative for fracture or focal lesion. Sinuses/Orbits: Paranasal sinuses and mastoid air cells are normally aerated. Opacification of external auditory canals, probably cerumen. No acute finding of orbits identified. Other: None. IMPRESSION: 1. No acute intracranial abnormality identified. 2. Moderate chronic microvascular ischemic changes and parenchymal volume loss of the brain. Electronically Signed   By: Mitzi Hansen M.D.   On: 09/04/2017 21:40    Procedures Procedures (including critical care  time)  Medications Ordered in ED Medications  cephALEXin (KEFLEX) capsule 1,000 mg (1,000 mg Oral Given 09/05/17 0047)     Initial Impression / Assessment and Plan / ED Course  I have reviewed the triage vital signs and the nursing notes.  Pertinent labs & imaging results that  were available during my care of the patient were reviewed by me and considered in my medical decision making (see chart for details).      Final Clinical Impressions(s) / ED Diagnoses   Final diagnoses:  Confusion  Hematuria, unspecified type  Patient's daughter did arrive to review HPI as well.  Patient does sleep late in the day but she notes he seems to be sleeping more and eating less.  He does had not quite been himself.  In the emergency department patient is alert and interactive with no signs of distress.  Diagnostic workup is essentially negative except for some hematuria.  He has no pain and no limitations to activity he is able to get up get dressed move about without problem.  Will treat for possible UTI with hematuria given some recent baseline change.  Daughter made aware of finding and need for follow-up.  Patient is discharged in good condition alert and in no distress.  ED Discharge Orders        Ordered    cephALEXin (KEFLEX) 500 MG capsule  2 times daily     09/05/17 Karle Starch, MD 09/06/17 [phone]

## 2017-09-05 MED ORDER — CEPHALEXIN 250 MG PO CAPS
1000.0000 mg | ORAL_CAPSULE | Freq: Once | ORAL | Status: AC
  Administered 2017-09-05: 1000 mg via ORAL
  Filled 2017-09-05: qty 4

## 2017-09-05 MED ORDER — CEPHALEXIN 500 MG PO CAPS: 1000.0000 mg | ORAL_CAPSULE | Freq: Two times a day (BID) | ORAL | 0 refills | Status: DC

## 2017-09-05 NOTE — ED Notes (Signed)
Pt had just urinated prior to urine culture order being placed.  Per lab, unable to add culture to previous urine collected.

## 2017-09-06 LAB — URINE CULTURE

## 2017-09-12 ENCOUNTER — Other Ambulatory Visit: Payer: Self-pay | Admitting: Cardiology

## 2017-09-19 ENCOUNTER — Ambulatory Visit (INDEPENDENT_AMBULATORY_CARE_PROVIDER_SITE_OTHER): Payer: Medicare Other | Admitting: Family Medicine

## 2017-09-19 ENCOUNTER — Encounter: Payer: Self-pay | Admitting: Family Medicine

## 2017-09-19 DIAGNOSIS — N3001 Acute cystitis with hematuria: Secondary | ICD-10-CM | POA: Diagnosis not present

## 2017-09-19 NOTE — Progress Notes (Signed)
He appeared drowsy and wasn't acting normally.  That led to ER eval.  U/a with Hg, with patient on xarelto.  ucx neg.  Was treated with keflex in the meantime.  Done with ABX now.  Presumed dx of UTI.  ER course d/w pt and family.    Hx per family.  Patient doesn't recall any of the event.    Now without complaints, with memory loss at baseline.  Per family he's back to baseline activity, wakefulness.    PMH and SH reviewed  ROS: Per HPI unless specifically indicated in ROS section   Meds, vitals, and allergies reviewed.   GEN: nad, alert but not oriented, pleasant in conversation.  HEENT: mucous membranes moist NECK: supple w/o LA CV: rrr.  PULM: ctab, no inc wob ABD: soft, +bs, not ttp EXT: no edema

## 2017-09-19 NOTE — Patient Instructions (Signed)
It looks like you had a UTI/bladder infection that was treated and it likely doesn't make sense to go through extra testing right now.  If you notice bloody urine, pain with urination, or sudden changes in thoughts, then let me know.   Take care.  Glad to see you.

## 2017-09-20 ENCOUNTER — Telehealth: Payer: Self-pay

## 2017-09-20 NOTE — Telephone Encounter (Signed)
Copied from CRM [phone]. Topic: Referral - Request >> Sep 20, 2017  2:06 PM Oneal GroutSebastian, Jennifer S wrote: Reason for CRM: Patient was seen yesterday, forgot to show mole on his back. Can a picture be emailed or texted? Be seen again? Or does patient need to be referred to Dermatology. Please advise

## 2017-09-21 ENCOUNTER — Encounter: Payer: Self-pay | Admitting: Family Medicine

## 2017-09-21 DIAGNOSIS — N390 Urinary tract infection, site not specified: Secondary | ICD-10-CM | POA: Insufficient documentation

## 2017-09-21 NOTE — Telephone Encounter (Signed)
Thanks

## 2017-09-21 NOTE — Telephone Encounter (Signed)
Hard to address by picture.  They can try to send a mychart message.  If I can give a good answer, then I will.  May still need OV.  Have them take a picture with a ruler near the lesion (in the picture) so I can measure it.  Have them specify the location, upper/lower back, right/middle/left, etc.  Thanks.

## 2017-09-21 NOTE — Assessment & Plan Note (Signed)
Presumed dx.  Done with ABX now. ER course d/w pt and family.   Path/phys d/w pt, esp mentation changes in elderly patient with memory loss.  No need to recheck ucx at this point, with potential contamination of ucx that would lead to false positive.   W/o sx at this point, observe only.  Family agrees.  Update me as needed.  >25 minutes spent in face to face time with patient, >50% spent in counselling or coordination of care.

## 2017-09-21 NOTE — Telephone Encounter (Signed)
Patient's daughter Zella Ball(Robin) notified as instructed by telephone and verbalized understanding. Zella BallRobin stated that it will be hard to take a picture and send thru my chart. Zella BallRobin stated that it will be easier to bring him in for Dr. Para Marchuncan to check it. Appointment scheduled for 09/29/17.

## 2017-09-29 ENCOUNTER — Encounter: Payer: Self-pay | Admitting: Family Medicine

## 2017-09-29 ENCOUNTER — Ambulatory Visit (INDEPENDENT_AMBULATORY_CARE_PROVIDER_SITE_OTHER): Payer: Medicare Other | Admitting: Family Medicine

## 2017-09-29 DIAGNOSIS — L821 Other seborrheic keratosis: Secondary | ICD-10-CM

## 2017-09-29 NOTE — Patient Instructions (Signed)
Two types of skin lesions.  Red spots are cherry angiomas.  Collection of small blood vessels in the skin.  No treatment needed.  Dark spots are seborrheic keratoses.  Benign, not cancer, no treatment needed unless irritated, cracked, bleeding, etc.  Take care.  Glad to see you.  No charge for visit.

## 2017-09-29 NOTE — Progress Notes (Signed)
Recheck pulse 60.    Recheck skin lesions.  Brown lesion noted on the left upper back.  Family wanted evaluation.  Present for a long period of time.  no symptoms associated with the lesion.  Meds, vitals, and allergies reviewed.   ROS: Per HPI unless specifically indicated in ROS section   nad Demented at baseline but pleasant in conversation.  L upper back 1cm SK Other SKs noted Mult cherry angiomas noted.

## 2017-09-30 DIAGNOSIS — L821 Other seborrheic keratosis: Secondary | ICD-10-CM | POA: Insufficient documentation

## 2017-09-30 NOTE — Assessment & Plan Note (Signed)
L upper back 1cm SK Other SKs noted Mult cherry angiomas noted.   No intervention needed.  No charge for visit.  See after visit summary.  Discussed.

## 2017-11-14 ENCOUNTER — Encounter: Payer: Self-pay | Admitting: Podiatry

## 2017-11-14 ENCOUNTER — Ambulatory Visit: Payer: Medicare Other | Admitting: Podiatry

## 2017-11-14 DIAGNOSIS — M79675 Pain in left toe(s): Secondary | ICD-10-CM

## 2017-11-14 DIAGNOSIS — B351 Tinea unguium: Secondary | ICD-10-CM | POA: Diagnosis not present

## 2017-11-14 DIAGNOSIS — M79674 Pain in right toe(s): Secondary | ICD-10-CM | POA: Diagnosis not present

## 2017-11-16 NOTE — Progress Notes (Signed)
Subjective:   Patient ID: Tony StackEdward L Conaty, male   DOB: 84 y.o.   MRN: [id number]   HPI Patient presents with elongated nailbeds 1-5 both feet that are thick and painful when palpated   ROS      Objective:  Physical Exam  Neurovascular status intact with yellow brittle nailbeds 1-5 both feet that are painful     Assessment:  Mycotic nail infection with pain 1-5 both feet     Plan:  Debride painful nailbeds 1-5 both feet with no iatrogenic bleeding noted

## 2018-01-11 ENCOUNTER — Encounter: Payer: Self-pay | Admitting: Internal Medicine

## 2018-01-23 ENCOUNTER — Encounter: Payer: Self-pay | Admitting: Internal Medicine

## 2018-01-23 ENCOUNTER — Ambulatory Visit: Payer: Medicare Other | Admitting: Internal Medicine

## 2018-01-23 VITALS — BP 120/66 | HR 72 | Ht 71.0 in | Wt 168.4 lb

## 2018-01-23 DIAGNOSIS — I2510 Atherosclerotic heart disease of native coronary artery without angina pectoris: Secondary | ICD-10-CM

## 2018-01-23 DIAGNOSIS — I482 Chronic atrial fibrillation: Secondary | ICD-10-CM

## 2018-01-23 DIAGNOSIS — I4821 Permanent atrial fibrillation: Secondary | ICD-10-CM

## 2018-01-23 DIAGNOSIS — N183 Chronic kidney disease, stage 3 unspecified: Secondary | ICD-10-CM

## 2018-01-23 DIAGNOSIS — I4891 Unspecified atrial fibrillation: Secondary | ICD-10-CM | POA: Diagnosis not present

## 2018-01-23 DIAGNOSIS — E785 Hyperlipidemia, unspecified: Secondary | ICD-10-CM

## 2018-01-23 DIAGNOSIS — I10 Essential (primary) hypertension: Secondary | ICD-10-CM | POA: Diagnosis not present

## 2018-01-23 DIAGNOSIS — I5032 Chronic diastolic (congestive) heart failure: Secondary | ICD-10-CM | POA: Diagnosis not present

## 2018-01-23 DIAGNOSIS — R9431 Abnormal electrocardiogram [ECG] [EKG]: Secondary | ICD-10-CM | POA: Diagnosis not present

## 2018-01-23 NOTE — Progress Notes (Signed)
Follow-up Outpatient Visit Date: 01/23/2018  Primary Care Provider: Joaquim Nam, MD [address] [place] [address]  Chief Complaint: Follow-up CAD, HF, and a-fib  HPI:  Tony Reed is a 84 y.o. year-old male with history of coronary artery disease, diastolic heart failure, permanent atrial fibrillation, chronic kidney disease stage 3, and dementia, who presents for follow-up of dyspnea on exertion.  I last saw Tony Reed in September, 2018, at which time he reported stable DOE.  He was otherwise feeling well.  We did not make any medication changes at that time.  Today, Tony Reed only complaint is of intermittent shortness of breath when he walks around his house.  This only lasts a few seconds and is not present every day.  Overall the dyspnea is unchanged compared with last year.  He denies chest pain, palpitations, lightheadedness, edema, and falls.  He remains on rivaroxaban without bleeding or falls.  --------------------------------------------------------------------------------------------------  Past Medical History:  Diagnosis Date  . CAD (coronary artery disease)    inferior MI in 2003 w BMS to RCA. DES to CFX in 2008.  Normal Myoviews in 5/13.  Marland Kitchen CHF (congestive heart failure) (HCC)    diastolic: Echo (4/11) w EF 50-60%, moderate MR, moderate to sever LAE, severe RAE, moderate to sever TR  . CKD (chronic kidney disease)   . Diverticulitis, colon   . GERD (gastroesophageal reflux disease)   . Hyperlipidemia   . Hypertension   . Leg edema    L>R chronically  . Leg pain    and decrease pulses consistent w peripheral vasc disease  . Memory difficulty   . Osteoporosis    s/p 5+ years of fosamax treatment as of 2013  . Paroxysmal atrial fibrillation (HCC)    w history of DCCV  . UTI (urinary tract infection)    with altered mentation 2018   Past Surgical History:  Procedure Laterality Date  . adenosine cardiolite  10/03/02   old infarct inf  o/w negative EF 61% 10/03/02  . CARDIAC CATHETERIZATION  1986   normal  . CARDIOVERSION  02/03/10   successful on Mutaq (Dr. Juanda Chance)   . Cath  06/25/09   nonobstructive disease afib (Dr. Juanda Chance)  . Cath, stents  09/27/02  . choleycystectomy  1996  . COLONOSCOPY  12/13/08   mod divertics no polyps (Dr Juanda Chance)  . CT  11/21/03   abd mod H. H., CT pelvis elevated prostate  . CYSTOSCOPY  09/26/03   Vonita Moss  . Echo multiple findings  01/18/10  . ESOPHAGOGASTRODUODENOSCOPY  12/05/03   postiive H-Pylori, H.H., stricture   . FLEXIBLE SIGMOIDOSCOPY  1/98  . hemmoroidectoy    . herniorraphy     x2  . Hosp[facility] R/O  01/06/04  . poypectomy    . stent in distal circum  04/17/02   Current Meds  Medication Sig  . atorvastatin (LIPITOR) 80 MG tablet TAKE 1 TABLET BY MOUTH EVERY DAY (Patient taking differently: TAKE 1 TABLET (80mg ) BY MOUTH EVERY DAY)  . ferrous sulfate 325 (65 FE) MG tablet Take 325 mg by mouth daily with breakfast.    . furosemide (LASIX) 40 MG tablet TAKE 2 TABLETS BY MOUTH DAILY (Patient taking differently: TAKE 2 TABLETS (80mg ) BY MOUTH DAILY)  . potassium chloride SA (K-DUR,KLOR-CON) 20 MEQ tablet Take 1 tablet (20 mEq total) by mouth 2 (two) times daily.  . ramipril (ALTACE) 2.5 MG capsule TAKE 1 CAPSULE(2.5 MG) BY MOUTH DAILY  . vitamin B-12 (  CYANOCOBALAMIN) 1000 MCG tablet Take 1 tablet (1,000 mcg total) by mouth daily.  [name] 15 MG TABS tablet TAKE 1 TABLET(15 MG) BY MOUTH DAILY WITH SUPPER    Allergies: Namenda [memantine hcl]; Naproxen; and Penicillins  Social History   Socioeconomic History  . Marital status: Married    Spouse name: Not on file  . Number of children: 6  . Years of education: Not on file  . Highest education level: Not on file  Occupational History    Employer: RETIRED  Social Needs  . Financial resource strain: Not on file  . Food insecurity:    Worry: Not on file    Inability: Not on file  . Transportation needs:    Medical: Not on file      Non-medical: Not on file  Tobacco Use  . Smoking status: Never Smoker  . Smokeless tobacco: Never Used  Substance and Sexual Activity  . Alcohol use: No  . Drug use: No  . Sexual activity: Yes  Lifestyle  . Physical activity:    Days per week: Not on file    Minutes per session: Not on file  . Stress: Not on file  Relationships  . Social connections:    Talks on phone: Not on file    Gets together: Not on file    Attends religious service: Not on file    Active member of club or organization: Not on file    Attends meetings of clubs or organizations: Not on file    Relationship status: Not on file  . Intimate partner violence:    Fear of current or ex partner: Not on file    Emotionally abused: Not on file    Physically abused: Not on file    Forced sexual activity: Not on file  Other Topics Concern  . Not on file  Social History Narrative   Retired. Lives w/son in [place].    Wife died 2017-03-04- due to dementia, patient may not recall that his wife has died    Family History  Problem Relation Age of Onset  . Hypertension Mother   . Leukemia Mother   . Lung cancer Brother        smoking  . Heart Problems Brother        smoker  . Deafness Brother     Review of Systems: A 12-system review of systems was performed and was negative except as noted in the HPI.  --------------------------------------------------------------------------------------------------  Physical Exam: BP 120/66   Pulse 72   Ht 5\' 11"  (1.803 m)   Wt 168 lb 6.4 oz (76.4 kg)   BMI 23.49 kg/m   General:  Elderly man, seated comfortably in the exam room. HEENT: No conjunctival pallor or scleral icterus. Moist mucous membranes.  OP clear. Neck: Supple without lymphadenopathy, thyromegaly, JVD, or HJR Lungs: Normal work of breathing. Clear to auscultation bilaterally without wheezes or crackles. Heart Irregularly irregular without murmurs or rubs.  Non-displaced PMI. Abd: Bowel sounds  present. Soft, NT/ND without hepatosplenomegaly Ext: No lower extremity edema. Radial, PT, and DP pulses are 2+ bilaterally. Skin: Warm and dry without rash.  EKG:  Atrial fibrillation with PVC's versus aberrancy. Prolonged QT.  Lab Results  Component Value Date   WBC 4.1 09/04/2017   HGB 11.7 (L) 09/04/2017   HCT 36.5 (L) 09/04/2017   MCV 99.2 09/04/2017   PLT 138 (L) 09/04/2017    Lab Results  Component Value Date   NA 140 09/04/2017   K  4.0 09/04/2017   CL 108 09/04/2017   CO2 23 09/04/2017   BUN 15 09/04/2017   CREATININE 1.23 09/04/2017   GLUCOSE 89 09/04/2017   ALT 14 (L) 09/04/2017    Lab Results  Component Value Date   CHOL 118 07/20/2017   HDL 47 07/20/2017   LDLCALC 55 07/20/2017   TRIG 81 07/20/2017   CHOLHDL 2.5 07/20/2017    --------------------------------------------------------------------------------------------------  ASSESSMENT AND PLAN: Permanent atrial fibrillation Adequate heart rate control today.  We will continue current doses of metoprolol and rivaroxaban; repeat CBC and BMP today.  Coronary artery disease without angina DOE is stable.  No other symptoms to suggest worsening coronary insufficiency.  We will continue atorvastatin 80 mg daily for secondary prevention.  LDL at goal when last checked in October.  HFpEF Tony Reed appears euvolemic on exam.  Mild DOE is stable.  We will continue current dose of furosemide and check a BMP today.  Hypertension Blood pressure normal.  Continue metoprolol and ramipril.  Hyperlipidemia Goal LDL < 70.  Continue atorvastatin 80 mg daily.  Prolonged QT QT ~500 ms, though baseline wander and a-fib make assessment challenging.  He is not on any medications that would prolong his QTc.  I will check a BMP and Mg level today.  Chronic kidney disease stage 3 No evidence of volume overload.  We will recheck BMP today.  Continue current dose of furosemide.  Follow-up: Return to clinic in 6 months  with EKG at that time.  Yvonne Kendallhristopher Daniil Labarge, MD 01/23/2018 1:36 PM

## 2018-01-23 NOTE — Patient Instructions (Signed)
Medication Instructions:  Your physician recommends that you continue on your current medications as directed. Please refer to the Current Medication list given to you today.  -- If you need a refill on your cardiac medications before your next appointment, please call your pharmacy. --  Labwork:TODAY  BMET/ MAGNESIUM/ CBC   Testing/Procedures: None ordered  Follow-Up: Your physician wants you to follow-up in: 6 MONTHS with Dr. Okey DupreEnd.    You will receive a reminder letter in the mail two months in advance. If you don't receive a letter, please call our office to schedule the follow-up appointment.  Thank you for choosing [facility]!!    Any Other Special Instructions Will Be Listed Below (If Applicable).

## 2018-01-24 LAB — BASIC METABOLIC PANEL
BUN/Creatinine Ratio: 12 (ref 10–24)
BUN: 19 mg/dL (ref 8–27)
CHLORIDE: 100 mmol/L (ref 96–106)
CO2: 24 mmol/L (ref 20–29)
CREATININE: 1.55 mg/dL — AB (ref 0.76–1.27)
Calcium: 9.5 mg/dL (ref 8.6–10.2)
GFR calc Af Amer: 45 mL/min/{1.73_m2} — ABNORMAL LOW (ref >59)
GFR calc non Af Amer: 39 mL/min/{1.73_m2} — ABNORMAL LOW (ref >59)
GLUCOSE: 123 mg/dL — AB (ref 65–99)
Potassium: 3.8 mmol/L (ref 3.5–5.2)
Sodium: 141 mmol/L (ref 134–144)

## 2018-01-24 LAB — CBC
HEMATOCRIT: 36.5 % — AB (ref 37.5–51.0)
HEMOGLOBIN: 12.2 g/dL — AB (ref 13.0–17.7)
MCH: 31.8 pg (ref 26.6–33.0)
MCHC: 33.4 g/dL (ref 31.5–35.7)
MCV: 95 fL (ref 79–97)
Platelets: 224 10*3/uL (ref 150–379)
RBC: 3.84 x10E6/uL — ABNORMAL LOW (ref 4.14–5.80)
RDW: 14.1 % (ref 12.3–15.4)
WBC: 3.7 10*3/uL (ref 3.4–10.8)

## 2018-01-24 LAB — MAGNESIUM: MAGNESIUM: 2.2 mg/dL (ref 1.6–2.3)

## 2018-01-25 ENCOUNTER — Other Ambulatory Visit: Payer: Self-pay

## 2018-01-25 MED ORDER — POTASSIUM CHLORIDE CRYS ER 20 MEQ PO TBCR: 20.0000 meq | EXTENDED_RELEASE_TABLET | Freq: Every day | ORAL | 5 refills | Status: DC

## 2018-01-25 MED ORDER — FUROSEMIDE 40 MG PO TABS: 40.0000 mg | ORAL_TABLET | Freq: Every day | ORAL | 10 refills | Status: DC

## 2018-01-26 ENCOUNTER — Other Ambulatory Visit: Payer: Self-pay | Admitting: Internal Medicine

## 2018-01-26 DIAGNOSIS — I10 Essential (primary) hypertension: Secondary | ICD-10-CM

## 2018-01-26 DIAGNOSIS — I2510 Atherosclerotic heart disease of native coronary artery without angina pectoris: Secondary | ICD-10-CM

## 2018-01-26 DIAGNOSIS — I4819 Other persistent atrial fibrillation: Secondary | ICD-10-CM

## 2018-01-27 NOTE — Telephone Encounter (Signed)
Refill Request.  

## 2018-02-24 ENCOUNTER — Other Ambulatory Visit: Payer: Medicare Other | Admitting: *Deleted

## 2018-02-24 ENCOUNTER — Other Ambulatory Visit: Payer: Self-pay | Admitting: *Deleted

## 2018-02-24 DIAGNOSIS — R7989 Other specified abnormal findings of blood chemistry: Secondary | ICD-10-CM

## 2018-02-25 LAB — BASIC METABOLIC PANEL
BUN/Creatinine Ratio: 10 (ref 10–24)
BUN: 13 mg/dL (ref 8–27)
CALCIUM: 9.5 mg/dL (ref 8.6–10.2)
CHLORIDE: 99 mmol/L (ref 96–106)
CO2: 26 mmol/L (ref 20–29)
Creatinine, Ser: 1.24 mg/dL (ref 0.76–1.27)
GFR calc non Af Amer: 51 mL/min/{1.73_m2} — ABNORMAL LOW (ref >59)
GFR, EST AFRICAN AMERICAN: 59 mL/min/{1.73_m2} — AB (ref >59)
Glucose: 91 mg/dL (ref 65–99)
POTASSIUM: 3.9 mmol/L (ref 3.5–5.2)
Sodium: 139 mmol/L (ref 134–144)

## 2018-06-07 ENCOUNTER — Other Ambulatory Visit: Payer: Self-pay | Admitting: Internal Medicine

## 2018-06-08 NOTE — Telephone Encounter (Signed)
Please review for refill. Thanks!  

## 2018-07-11 ENCOUNTER — Other Ambulatory Visit: Payer: Self-pay | Admitting: Internal Medicine

## 2018-07-11 NOTE — Telephone Encounter (Signed)
Please review for refill.  

## 2018-07-30 ENCOUNTER — Other Ambulatory Visit: Payer: Self-pay | Admitting: Cardiology

## 2018-08-18 ENCOUNTER — Other Ambulatory Visit: Payer: Self-pay | Admitting: Cardiology

## 2018-08-31 ENCOUNTER — Other Ambulatory Visit: Payer: Self-pay

## 2018-08-31 NOTE — Patient Outreach (Signed)
[facility] Care Management  08/31/2018  Katrina Stackdward L Dunivan 09/30/1926 [id number]   Medication Adherence call to Mr. Margaree MackintoshEdward Wehmeyer spoke with patient's son he still has Atorvastatin 80 mg and they fill it on a regular basis. Mr. Merilynn FinlandRobertson is showing past due under [facility].   Lillia AbedAna Ollison-Moran CPhT Pharmacy Technician [facility] Care Management Direct Dial [phone]  Fax [phone] [email or web address]

## 2018-09-04 ENCOUNTER — Ambulatory Visit: Payer: Medicare Other | Admitting: Internal Medicine

## 2018-09-04 ENCOUNTER — Encounter: Payer: Self-pay | Admitting: Internal Medicine

## 2018-09-04 VITALS — BP 110/64 | HR 56 | Ht 71.0 in | Wt 161.3 lb

## 2018-09-04 DIAGNOSIS — E785 Hyperlipidemia, unspecified: Secondary | ICD-10-CM | POA: Diagnosis not present

## 2018-09-04 DIAGNOSIS — I2510 Atherosclerotic heart disease of native coronary artery without angina pectoris: Secondary | ICD-10-CM

## 2018-09-04 DIAGNOSIS — I4821 Permanent atrial fibrillation: Secondary | ICD-10-CM

## 2018-09-04 DIAGNOSIS — I10 Essential (primary) hypertension: Secondary | ICD-10-CM | POA: Diagnosis not present

## 2018-09-04 NOTE — Progress Notes (Signed)
Follow-up Outpatient Visit Date: 09/04/2018  Primary Care Provider: Joaquim Reed, Graham S, MD [address] [place] [id number]  Chief Complaint: Follow-up coronary artery disease and atrial fibrillation  HPI:  [name] is a 84 y.o. year-old male with history of coronary artery disease status post PCI's to the LCx and RCA in 2008 and 2003, respectively, diastolic heart failure,permanentatrial fibrillation, chronic kidney disease stage 3, and dementia, who presents for follow-up of dyspnea on exertion.  I last saw him in April, at which time he noted intermittent shortness of breath when walking around his house, lasting only a few seconds.  Overall, his dyspnea was unchanged to a year earlier.  We did not make any medication changes.  Today, [name] is without complaints, denying chest pain, shortness of breath, palpitations, lightheadedness, and edema.  His daughter notes that Tony Reed's hearing has declined; he may need hearing aids in the future.  He remains compliant with his medications.  He has not had any bleeding, remaining on rivaroxaban.  He had a fall after missing a step walking out of a convenience store a few weeks ago.  He had abrasions on his hand and knee but no significant injuries.  He has not had any other falls.  --------------------------------------------------------------------------------------------------  Past Medical History:  Diagnosis Date  . CAD (coronary artery disease)    inferior MI in 2003 w BMS to RCA. DES to CFX in 2008.  Normal Myoviews in 5/13.  Marland Kitchen. CHF (congestive heart failure) (HCC)    diastolic: Echo (4/11) w EF 50-60%, moderate MR, moderate to sever LAE, severe RAE, moderate to sever TR  . CKD (chronic kidney disease)   . Diverticulitis, colon   . GERD (gastroesophageal reflux disease)   . Hyperlipidemia   . Hypertension   . Leg edema    L>R chronically  . Leg pain    and decrease pulses consistent w peripheral vasc  disease  . Memory difficulty   . Osteoporosis    s/p 5+ years of fosamax treatment as of 2013  . Paroxysmal atrial fibrillation (HCC)    w history of DCCV  . UTI (urinary tract infection)    with altered mentation 2018   Past Surgical History:  Procedure Laterality Date  . adenosine cardiolite  10/03/02   old infarct inf o/w negative EF 61% 10/03/02  . CARDIAC CATHETERIZATION  1986   normal  . CARDIOVERSION  02/03/10   successful on Mutaq (Dr. Juanda ChanceBrodie)   . Cath  06/25/09   nonobstructive disease afib (Dr. Juanda ChanceBrodie)  . Cath, stents  09/27/02  . choleycystectomy  1996  . COLONOSCOPY  12/13/08   mod divertics no polyps (Dr Juanda ChanceBrodie)  . CT  11/21/03   abd mod H. H., CT pelvis elevated prostate  . CYSTOSCOPY  09/26/03   Vonita MossPeterson  . Echo multiple findings  01/18/10  . ESOPHAGOGASTRODUODENOSCOPY  12/05/03   postiive H-Pylori, H.H., stricture   . FLEXIBLE SIGMOIDOSCOPY  1/98  . hemmoroidectoy    . herniorraphy     x2  . Hosp. [facility] R/O  01/06/04  . poypectomy    . stent in distal circum  04/17/02    Current Meds  Medication Sig  . atorvastatin (LIPITOR) 80 MG tablet TAKE 1 TABLET BY MOUTH EVERY DAY (Patient taking differently: TAKE 1 TABLET (80mg ) BY MOUTH EVERY DAY)  . ferrous sulfate 325 (65 FE) MG tablet Take 325 mg by mouth daily with breakfast.    . furosemide (LASIX) 40  MG tablet Take 1 tablet (40 mg total) by mouth daily.  . furosemide (LASIX) 40 MG tablet TAKE 1 TABLET(40 MG) BY MOUTH DAILY  . potassium chloride SA (K-DUR,KLOR-CON) 20 MEQ tablet Take 1 tablet (20 mEq total) by mouth daily.  . ramipril (ALTACE) 2.5 MG capsule TAKE 1 CAPSULE(2.5 MG) BY MOUTH DAILY  . vitamin B-12 (CYANOCOBALAMIN) 1000 MCG tablet Take 1 tablet (1,000 mcg total) by mouth daily.  [name] 15 MG TABS tablet TAKE 1 TABLET(15 MG) BY MOUTH DAILY WITH SUPPER    Allergies: Namenda [memantine hcl]; Naproxen; and Penicillins  Social History   Tobacco Use  . Smoking status: Never Smoker  . Smokeless  tobacco: Never Used  Substance Use Topics  . Alcohol use: No  . Drug use: No    Family History  Problem Relation Age of Onset  . Hypertension Mother   . Leukemia Mother   . Lung cancer Brother        smoking  . Heart Problems Brother        smoker  . Deafness Brother     Review of Systems: A 12-system review of systems was performed and was negative except as noted in the HPI.  --------------------------------------------------------------------------------------------------  Physical Exam: BP 110/64   Pulse (!) 56   Ht 5\' 11"  (1.803 m)   Wt 161 lb 4.8 oz (73.2 kg)   BMI 22.50 kg/m   General: Elderly man, seated comfortably on the exam table. HEENT: No conjunctival pallor or scleral icterus. Moist mucous membranes.  OP clear.  Decreased hearing acuity evident. Neck: Supple without lymphadenopathy, thyromegaly, JVD, or HJR. Lungs: Normal work of breathing. Clear to auscultation bilaterally without wheezes or crackles. Heart: Bradycardic and irregularly irregular without murmurs or rubs.  Nondisplaced PMI. Abd: Bowel sounds present. Soft, NT/ND without hepatosplenomegaly Ext: No lower extremity edema. Radial, PT, and DP pulses are 2+ bilaterally. Skin: Warm and dry without rash.  EKG: Atrial fibrillation with slow ventricular response (ventricular rate 56 bpm).  Otherwise, no significant abnormality.  Lab Results  Component Value Date   WBC 3.7 01/23/2018   HGB 12.2 (L) 01/23/2018   HCT 36.5 (L) 01/23/2018   MCV 95 01/23/2018   PLT 224 01/23/2018    Lab Results  Component Value Date   NA 139 02/24/2018   K 3.9 02/24/2018   CL 99 02/24/2018   CO2 26 02/24/2018   BUN 13 02/24/2018   CREATININE 1.24 02/24/2018   GLUCOSE 91 02/24/2018   ALT 14 (L) 09/04/2017    Lab Results  Component Value Date   CHOL 118 07/20/2017   HDL 47 07/20/2017   LDLCALC 55 07/20/2017   TRIG 81 07/20/2017   CHOLHDL 2.5 07/20/2017     --------------------------------------------------------------------------------------------------  ASSESSMENT AND PLAN: Coronary artery disease without angina No symptoms to suggest worsening coronary insufficiency.  Continue current medications for secondary prevention.  Given that Mr. Schoon is on indefinite anticoagulation with rivaroxaban, we will keep him off aspirin.  Permanent atrial fibrillation Ventricular response still a little slow but without symptoms.  Mr. Pore is not on any AV nodal blocking agents.  We will not make any medication changes today.  I will continue rivaroxaban 15 mg daily.  I will check a CBC and CMP today.  Hyperlipidemia Most recent LDL over a year ago was well controlled.  We will check a lipid panel and LFTs today.  Follow-up: Follow-up with Tereso Newcomer, PA, in 6 months.  Yvonne Kendall, MD 09/04/2018 2:32 PM

## 2018-09-04 NOTE — Patient Instructions (Signed)
Medication Instructions:  NONE If you need a refill on your cardiac medications before your next appointment, please call your pharmacy.   Lab work:TODAY CBC/LIPID/CMP   If you have labs (blood work) drawn today and your tests are completely normal, you will receive your results only by: Marland Kitchen. MyChart Message (if you have MyChart) OR . A paper copy in the mail If you have any lab test that is abnormal or we need to change your treatment, we will call you to review the results.  Testing/Procedures: NONE  Follow-Up: At [facility], you and your health needs are our priority.  As part of our continuing mission to provide you with exceptional heart care, we have created designated Provider Care Teams.  These Care Teams include your primary Cardiologist (physician) and Advanced Practice Providers (APPs -  Physician Assistants and Nurse Practitioners) who all work together to provide you with the care you need, when you need it. . 6 MONTHS with Tony Reed.  Call in 4 months to schedule appointment.  Any Other Special Instructions Will Be Listed Below (If Applicable).

## 2018-09-05 LAB — COMPREHENSIVE METABOLIC PANEL
ALBUMIN: 4.3 g/dL (ref 3.5–4.7)
ALT: 17 IU/L (ref 0–44)
AST: 24 IU/L (ref 0–40)
Albumin/Globulin Ratio: 1.7 (ref 1.2–2.2)
Alkaline Phosphatase: 89 IU/L (ref 39–117)
BUN / CREAT RATIO: 18 (ref 10–24)
BUN: 22 mg/dL (ref 8–27)
Bilirubin Total: 0.5 mg/dL (ref 0.0–1.2)
CO2: 22 mmol/L (ref 20–29)
CREATININE: 1.25 mg/dL (ref 0.76–1.27)
Calcium: 9.3 mg/dL (ref 8.6–10.2)
Chloride: 99 mmol/L (ref 96–106)
GFR calc non Af Amer: 51 mL/min/{1.73_m2} — ABNORMAL LOW (ref >59)
GFR, EST AFRICAN AMERICAN: 59 mL/min/{1.73_m2} — AB (ref >59)
GLUCOSE: 84 mg/dL (ref 65–99)
Globulin, Total: 2.6 g/dL (ref 1.5–4.5)
Potassium: 4.3 mmol/L (ref 3.5–5.2)
Sodium: 141 mmol/L (ref 134–144)
TOTAL PROTEIN: 6.9 g/dL (ref 6.0–8.5)

## 2018-09-05 LAB — LIPID PANEL
Chol/HDL Ratio: 2.2 ratio (ref 0.0–5.0)
Cholesterol, Total: 131 mg/dL (ref 100–199)
HDL: 59 mg/dL (ref >39)
LDL Calculated: 58 mg/dL (ref 0–99)
Triglycerides: 69 mg/dL (ref 0–149)
VLDL Cholesterol Cal: 14 mg/dL (ref 5–40)

## 2018-09-05 LAB — CBC
HEMATOCRIT: 35.6 % — AB (ref 37.5–51.0)
Hemoglobin: 11.7 g/dL — ABNORMAL LOW (ref 13.0–17.7)
MCH: 31.9 pg (ref 26.6–33.0)
MCHC: 32.9 g/dL (ref 31.5–35.7)
MCV: 97 fL (ref 79–97)
Platelets: 168 10*3/uL (ref 150–450)
RBC: 3.67 x10E6/uL — ABNORMAL LOW (ref 4.14–5.80)
RDW: 12.5 % (ref 12.3–15.4)
WBC: 4.7 10*3/uL (ref 3.4–10.8)

## 2018-09-06 ENCOUNTER — Telehealth: Payer: Self-pay

## 2018-09-06 NOTE — Telephone Encounter (Signed)
Notes recorded by Sigurd Sosapp, Boden Stucky, RN on 09/06/2018 at 8:23 AM EST The patient's daughter [facility]) has been notified of the result and verbalized understanding. All questions (if any) were answered. Sigurd SosMichael Alyah Boehning, RN 09/06/2018 8:22 AM

## 2018-09-06 NOTE — Telephone Encounter (Signed)
-----   Message from Yvonne Kendallhristopher End, MD sent at 09/06/2018  7:04 AM EST ----- Please let [name] know that his labs look fine.  Renal function is mildly impaired but stable.  His cholesterol is at goal with normal liver function.  Mild chronic anemia is stable compared with 2 years ago.  He should continue his current medications.

## 2018-09-28 ENCOUNTER — Other Ambulatory Visit: Payer: Self-pay | Admitting: Cardiology

## 2018-10-03 ENCOUNTER — Other Ambulatory Visit: Payer: Self-pay | Admitting: Internal Medicine

## 2018-10-18 ENCOUNTER — Other Ambulatory Visit: Payer: Self-pay | Admitting: Cardiology

## 2018-11-13 ENCOUNTER — Telehealth: Payer: Self-pay | Admitting: Family Medicine

## 2018-11-13 NOTE — Telephone Encounter (Signed)
Patient scheduled for appointment 11/14/2018

## 2018-11-13 NOTE — Telephone Encounter (Signed)
Robin (on dpr) called office requesting a call about the pt. She wants to see if they can have home health come out to help with him. The pt is not eating and has had diarrhea since Thursday. Best cb # [phone].

## 2018-11-14 ENCOUNTER — Emergency Department (HOSPITAL_COMMUNITY): Payer: Medicare Other

## 2018-11-14 ENCOUNTER — Ambulatory Visit (INDEPENDENT_AMBULATORY_CARE_PROVIDER_SITE_OTHER): Payer: Medicare Other | Admitting: Family Medicine

## 2018-11-14 ENCOUNTER — Encounter (HOSPITAL_COMMUNITY): Payer: Self-pay

## 2018-11-14 ENCOUNTER — Inpatient Hospital Stay (HOSPITAL_COMMUNITY)
Admission: EM | Admit: 2018-11-14 | Discharge: 2018-11-23 | DRG: 640 | Disposition: A | Discharge status: Skilled Nursing Facility | Payer: Medicare Other | Attending: Internal Medicine | Admitting: Internal Medicine

## 2018-11-14 ENCOUNTER — Other Ambulatory Visit: Payer: Self-pay

## 2018-11-14 ENCOUNTER — Encounter: Payer: Self-pay | Admitting: Family Medicine

## 2018-11-14 DIAGNOSIS — I482 Chronic atrial fibrillation, unspecified: Secondary | ICD-10-CM | POA: Diagnosis present

## 2018-11-14 DIAGNOSIS — Z886 Allergy status to analgesic agent status: Secondary | ICD-10-CM

## 2018-11-14 DIAGNOSIS — Z8249 Family history of ischemic heart disease and other diseases of the circulatory system: Secondary | ICD-10-CM | POA: Diagnosis not present

## 2018-11-14 DIAGNOSIS — R195 Other fecal abnormalities: Secondary | ICD-10-CM | POA: Diagnosis not present

## 2018-11-14 DIAGNOSIS — Z515 Encounter for palliative care: Secondary | ICD-10-CM | POA: Diagnosis not present

## 2018-11-14 DIAGNOSIS — E870 Hyperosmolality and hypernatremia: Principal | ICD-10-CM | POA: Diagnosis present

## 2018-11-14 DIAGNOSIS — E440 Moderate protein-calorie malnutrition: Secondary | ICD-10-CM | POA: Diagnosis present

## 2018-11-14 DIAGNOSIS — Z7189 Other specified counseling: Secondary | ICD-10-CM | POA: Diagnosis not present

## 2018-11-14 DIAGNOSIS — R627 Adult failure to thrive: Secondary | ICD-10-CM | POA: Diagnosis present

## 2018-11-14 DIAGNOSIS — J181 Lobar pneumonia, unspecified organism: Secondary | ICD-10-CM

## 2018-11-14 DIAGNOSIS — I130 Hypertensive heart and chronic kidney disease with heart failure and stage 1 through stage 4 chronic kidney disease, or unspecified chronic kidney disease: Secondary | ICD-10-CM | POA: Diagnosis present

## 2018-11-14 DIAGNOSIS — F0390 Unspecified dementia without behavioral disturbance: Secondary | ICD-10-CM | POA: Diagnosis present

## 2018-11-14 DIAGNOSIS — M810 Age-related osteoporosis without current pathological fracture: Secondary | ICD-10-CM | POA: Diagnosis not present

## 2018-11-14 DIAGNOSIS — Z9861 Coronary angioplasty status: Secondary | ICD-10-CM

## 2018-11-14 DIAGNOSIS — I2510 Atherosclerotic heart disease of native coronary artery without angina pectoris: Secondary | ICD-10-CM | POA: Diagnosis present

## 2018-11-14 DIAGNOSIS — Z880 Allergy status to penicillin: Secondary | ICD-10-CM | POA: Diagnosis not present

## 2018-11-14 DIAGNOSIS — Z86010 Personal history of colonic polyps: Secondary | ICD-10-CM

## 2018-11-14 DIAGNOSIS — G9341 Metabolic encephalopathy: Secondary | ICD-10-CM | POA: Diagnosis present

## 2018-11-14 DIAGNOSIS — Z66 Do not resuscitate: Secondary | ICD-10-CM | POA: Diagnosis present

## 2018-11-14 DIAGNOSIS — N183 Chronic kidney disease, stage 3 (moderate): Secondary | ICD-10-CM | POA: Diagnosis not present

## 2018-11-14 DIAGNOSIS — J189 Pneumonia, unspecified organism: Secondary | ICD-10-CM | POA: Diagnosis present

## 2018-11-14 DIAGNOSIS — R4182 Altered mental status, unspecified: Secondary | ICD-10-CM

## 2018-11-14 DIAGNOSIS — Z7901 Long term (current) use of anticoagulants: Secondary | ICD-10-CM

## 2018-11-14 DIAGNOSIS — D539 Nutritional anemia, unspecified: Secondary | ICD-10-CM | POA: Diagnosis present

## 2018-11-14 DIAGNOSIS — I252 Old myocardial infarction: Secondary | ICD-10-CM

## 2018-11-14 DIAGNOSIS — Z801 Family history of malignant neoplasm of trachea, bronchus and lung: Secondary | ICD-10-CM

## 2018-11-14 DIAGNOSIS — E876 Hypokalemia: Secondary | ICD-10-CM | POA: Diagnosis not present

## 2018-11-14 DIAGNOSIS — E860 Dehydration: Secondary | ICD-10-CM | POA: Diagnosis not present

## 2018-11-14 DIAGNOSIS — E785 Hyperlipidemia, unspecified: Secondary | ICD-10-CM | POA: Diagnosis not present

## 2018-11-14 DIAGNOSIS — N179 Acute kidney failure, unspecified: Secondary | ICD-10-CM | POA: Diagnosis not present

## 2018-11-14 DIAGNOSIS — K922 Gastrointestinal hemorrhage, unspecified: Secondary | ICD-10-CM | POA: Diagnosis present

## 2018-11-14 DIAGNOSIS — I480 Paroxysmal atrial fibrillation: Secondary | ICD-10-CM | POA: Diagnosis present

## 2018-11-14 DIAGNOSIS — Z6821 Body mass index (BMI) 21.0-21.9, adult: Secondary | ICD-10-CM

## 2018-11-14 DIAGNOSIS — Z888 Allergy status to other drugs, medicaments and biological substances status: Secondary | ICD-10-CM

## 2018-11-14 DIAGNOSIS — I5032 Chronic diastolic (congestive) heart failure: Secondary | ICD-10-CM | POA: Diagnosis present

## 2018-11-14 DIAGNOSIS — E871 Hypo-osmolality and hyponatremia: Secondary | ICD-10-CM | POA: Diagnosis not present

## 2018-11-14 DIAGNOSIS — I10 Essential (primary) hypertension: Secondary | ICD-10-CM | POA: Diagnosis not present

## 2018-11-14 DIAGNOSIS — I4891 Unspecified atrial fibrillation: Secondary | ICD-10-CM | POA: Diagnosis present

## 2018-11-14 DIAGNOSIS — K219 Gastro-esophageal reflux disease without esophagitis: Secondary | ICD-10-CM | POA: Diagnosis present

## 2018-11-14 DIAGNOSIS — Z806 Family history of leukemia: Secondary | ICD-10-CM

## 2018-11-14 DIAGNOSIS — J439 Emphysema, unspecified: Secondary | ICD-10-CM | POA: Diagnosis present

## 2018-11-14 DIAGNOSIS — I4821 Permanent atrial fibrillation: Secondary | ICD-10-CM | POA: Diagnosis not present

## 2018-11-14 DIAGNOSIS — R4020 Unspecified coma: Secondary | ICD-10-CM | POA: Diagnosis not present

## 2018-11-14 LAB — URINALYSIS, ROUTINE W REFLEX MICROSCOPIC
Bilirubin Urine: NEGATIVE
GLUCOSE, UA: NEGATIVE mg/dL
Ketones, ur: NEGATIVE mg/dL
Leukocytes, UA: NEGATIVE
Nitrite: NEGATIVE
Protein, ur: NEGATIVE mg/dL
Specific Gravity, Urine: 1.014 (ref 1.005–1.030)
pH: 5 (ref 5.0–8.0)

## 2018-11-14 LAB — COMPREHENSIVE METABOLIC PANEL
ALT: 31 U/L (ref 0–44)
AST: 53 U/L — ABNORMAL HIGH (ref 15–41)
Albumin: 3.1 g/dL — ABNORMAL LOW (ref 3.5–5.0)
Alkaline Phosphatase: 82 U/L (ref 38–126)
Anion gap: 11 (ref 5–15)
BUN: 79 mg/dL — ABNORMAL HIGH (ref 8–23)
CO2: 28 mmol/L (ref 22–32)
Calcium: 9.3 mg/dL (ref 8.9–10.3)
Chloride: 120 mmol/L — ABNORMAL HIGH (ref 98–111)
Creatinine, Ser: 2.58 mg/dL — ABNORMAL HIGH (ref 0.61–1.24)
GFR calc Af Amer: 24 mL/min — ABNORMAL LOW (ref >60)
GFR calc non Af Amer: 21 mL/min — ABNORMAL LOW (ref >60)
GLUCOSE: 138 mg/dL — AB (ref 70–99)
Potassium: 3.6 mmol/L (ref 3.5–5.1)
Sodium: 159 mmol/L — ABNORMAL HIGH (ref 135–145)
TOTAL PROTEIN: 8 g/dL (ref 6.5–8.1)
Total Bilirubin: 1.2 mg/dL (ref 0.3–1.2)

## 2018-11-14 LAB — CBC WITH DIFFERENTIAL/PLATELET
Abs Immature Granulocytes: 0.05 10*3/uL (ref 0.00–0.07)
Basophils Absolute: 0 10*3/uL (ref 0.0–0.1)
Basophils Relative: 0 %
Eosinophils Absolute: 0 10*3/uL (ref 0.0–0.5)
Eosinophils Relative: 0 %
HCT: 52.9 % — ABNORMAL HIGH (ref 39.0–52.0)
Hemoglobin: 15.8 g/dL (ref 13.0–17.0)
Immature Granulocytes: 0 %
Lymphocytes Relative: 6 %
Lymphs Abs: 0.7 10*3/uL (ref 0.7–4.0)
MCH: 30.8 pg (ref 26.0–34.0)
MCHC: 29.9 g/dL — ABNORMAL LOW (ref 30.0–36.0)
MCV: 103.1 fL — ABNORMAL HIGH (ref 80.0–100.0)
Monocytes Absolute: 0.5 10*3/uL (ref 0.1–1.0)
Monocytes Relative: 4 %
Neutro Abs: 11.2 10*3/uL — ABNORMAL HIGH (ref 1.7–7.7)
Neutrophils Relative %: 90 %
Platelets: 308 10*3/uL (ref 150–400)
RBC: 5.13 MIL/uL (ref 4.22–5.81)
RDW: 13.4 % (ref 11.5–15.5)
WBC: 12.5 10*3/uL — ABNORMAL HIGH (ref 4.0–10.5)
nRBC: 0 % (ref 0.0–0.2)

## 2018-11-14 LAB — POC OCCULT BLOOD, ED: Fecal Occult Bld: POSITIVE — AB

## 2018-11-14 LAB — TYPE AND SCREEN
ABO/RH(D): A POS
Antibody Screen: NEGATIVE

## 2018-11-14 LAB — LACTIC ACID, PLASMA: Lactic Acid, Venous: 2.3 mmol/L (ref 0.5–1.9)

## 2018-11-14 LAB — ABO/RH: ABO/RH(D): A POS

## 2018-11-14 MED ORDER — SODIUM CHLORIDE 0.9 % IV SOLN
100.0000 mg | Freq: Once | INTRAVENOUS | Status: AC
  Administered 2018-11-14: 100 mg via INTRAVENOUS
  Filled 2018-11-14: qty 100

## 2018-11-14 MED ORDER — SODIUM CHLORIDE 0.9 % IV BOLUS
1000.0000 mL | Freq: Once | INTRAVENOUS | Status: AC
  Administered 2018-11-14: 1000 mL via INTRAVENOUS

## 2018-11-14 MED ORDER — ACETAMINOPHEN 650 MG RE SUPP: 650.0000 mg | Freq: Four times a day (QID) | RECTAL | Status: DC | PRN

## 2018-11-14 MED ORDER — DEXTROSE 5 % IV SOLN
INTRAVENOUS | Status: AC
  Administered 2018-11-14 – 2018-11-15 (×3): via INTRAVENOUS

## 2018-11-14 MED ORDER — ACETAMINOPHEN 325 MG PO TABS: 650.0000 mg | ORAL_TABLET | Freq: Four times a day (QID) | ORAL | Status: DC | PRN

## 2018-11-14 MED ORDER — SODIUM CHLORIDE 0.9 % IV SOLN
8.0000 mg/h | INTRAVENOUS | Status: DC
  Administered 2018-11-15 – 2018-11-16 (×5): 8 mg/h via INTRAVENOUS
  Filled 2018-11-14 (×7): qty 80

## 2018-11-14 MED ORDER — SODIUM CHLORIDE 0.9 % IV SOLN
1.0000 g | INTRAVENOUS | Status: DC
  Administered 2018-11-15 – 2018-11-20 (×6): 1 g via INTRAVENOUS
  Filled 2018-11-14 (×6): qty 1
  Filled 2018-11-14: qty 10

## 2018-11-14 MED ORDER — SODIUM CHLORIDE 0.9 % IV SOLN
1.0000 g | Freq: Once | INTRAVENOUS | Status: AC
  Administered 2018-11-14: 1 g via INTRAVENOUS
  Filled 2018-11-14: qty 10

## 2018-11-14 MED ORDER — SODIUM CHLORIDE 0.9 % IV SOLN
500.0000 mg | Freq: Every day | INTRAVENOUS | Status: DC
  Administered 2018-11-14 – 2018-11-16 (×3): 500 mg via INTRAVENOUS
  Filled 2018-11-14 (×3): qty 500

## 2018-11-14 NOTE — ED Notes (Addendum)
Pt very difficult IV start iv team consult obtained after 3 unsuccessful attempts right arm first set blood cultures obtained

## 2018-11-14 NOTE — ED Notes (Signed)
Date and time results received: 11/14/18 6:29 PM  (use smartphrase ".now" to insert current time)  Test: Lactic acid Critical Value: 2.3  Name of Provider Notified: Adelina Mings PA  Orders Received? Or Actions Taken?: awaiting orders

## 2018-11-14 NOTE — ED Provider Notes (Signed)
[facility]-EMERGENCY DEPT Provider Note   CSN: [id number] Arrival date & time: 11/14/18  1650     History   Chief Complaint Chief Complaint  Patient presents with  . Failure To Thrive    HPI Tony Reed is a 85 y.o. male.  Tony Reed is a 85 y.o. male with a history of dementia, paroxysmal A. fib, CHF, CAD, CKD, GERD, hypertension, hyperlipidemia, on Xarelto chronically, who presents to the emergency department from primary care provider's office for evaluation of decreased activity and appetite and failure to thrive over the past week.  Family reports that since last Wednesday patient has not been eating and drinking well, they report he typically snacks frequently at baseline but has not been eating much at all and they have been having to try and force him to drink even little bits of water.  He is also been much less active than usual, typically he is chatty although frequently fused with his dementia, but is usually up walking around and wanting to take car rides and do activities but over the past week he has been much more fatigued and less active than normal.  Family also reported dark stools noted with multiple bowel movements over the past week.  Patient has not had any vomiting or hematemesis.  They have not noted patient did feel feverish or warm.  He has had an occasional cough but is not complained of any chest pain or shortness of breath.  Patient has not complained of any abdominal pain.  He has not had abdominal distention or lower extremity swelling.  Patient is on Xarelto chronically for paroxysmal A. fib, no other blood thinners, aspirin was discontinued by cardiologist a few months ago.  No falls or recent trauma noted by family.  Patient was evaluated for the symptoms in his primary care office and sent here via EMS for further evaluation given patient's altered mental status with concern for possible infection, GI bleed versus stroke.   Patient unable to contribute further to history due to dementia and altered mental status.  Level 5 caveat: Dementia, altered mental status.     Past Medical History:  Diagnosis Date  . CAD (coronary artery disease)    inferior MI in 2003 w BMS to RCA. DES to CFX in 2008.  Normal Myoviews in 5/13.  Marland Kitchen CHF (congestive heart failure) (HCC)    diastolic: Echo (4/11) w EF 50-60%, moderate MR, moderate to sever LAE, severe RAE, moderate to sever TR  . CKD (chronic kidney disease)   . Diverticulitis, colon   . GERD (gastroesophageal reflux disease)   . Hyperlipidemia   . Hypertension   . Leg edema    L>R chronically  . Leg pain    and decrease pulses consistent w peripheral vasc disease  . Memory difficulty   . Osteoporosis    s/p 5+ years of fosamax treatment as of 2013  . Paroxysmal atrial fibrillation (HCC)    w history of DCCV  . UTI (urinary tract infection)    with altered mentation 2018    Patient Active Problem List   Diagnosis Date Noted  . Altered mental status 11/14/2018  . Chronic kidney disease, stage 3 (HCC) 01/23/2018  . Prolonged Q-T interval on ECG 01/23/2018  . Seborrheic keratoses 09/30/2017  . UTI (urinary tract infection) 09/21/2017  . Chronic heart failure with preserved ejection fraction (HCC) 03/24/2017  . Cough 10/27/2015  . Long toenail 05/16/2015  . Advance care planning 05/16/2015  .  Dementia without behavioral disturbance (HCC) 05/16/2015  . Encounter for therapeutic drug monitoring 12/05/2013  . Orthostasis 04/21/2012  . Orthostatic hypotension 03/14/2012  . Syncope 03/09/2012  . Osteoporosis 02/11/2012  . Memory loss 08/11/2011  . Hyperlipidemia LDL goal <70 03/31/2011  . DIASTOLIC HEART FAILURE, CHRONIC 11/26/2010  . DIASTOLIC HEART FAILURE, ACUTE ON CHRONIC 27-Jul-202011  . Atrial fibrillation (HCC) 06/25/2009  . CARDIOVASCULAR FUNCTION STUDY, ABNORMAL 06/20/2009  . Coronary artery disease involving native coronary artery of native heart  without angina pectoris 06/13/2009  . UNSPECIFIED VITAMIN D DEFICIENCY 01/29/2009  . Edema 01/22/2008  . TESTOSTERONE DEFICIENCY 02/22/2007  . B12 deficiency 02/22/2007  . ANEMIA DUE TO CHRONIC BLOOD LOSS 02/22/2007  . ANEMIA, IRON DEFICIENCY 02/22/2007  . CATARACT, LEFT EYE 02/22/2007  . Essential hypertension 02/22/2007  . DIVERTICULOSIS, COLON 02/22/2007  . BENIGN PROSTATIC HYPERTROPHY, HX OF 02/22/2007    Past Surgical History:  Procedure Laterality Date  . adenosine cardiolite  10/03/02   old infarct inf o/w negative EF 61% 10/03/02  . CARDIAC CATHETERIZATION  1986   normal  . CARDIOVERSION  02/03/10   successful on Mutaq (Dr. Juanda Chance)   . Cath  06/25/09   nonobstructive disease afib (Dr. Juanda Chance)  . Cath, stents  09/27/02  . choleycystectomy  1996  . COLONOSCOPY  12/13/08   mod divertics no polyps (Dr Juanda Chance)  . CT  11/21/03   abd mod H. H., CT pelvis elevated prostate  . CYSTOSCOPY  09/26/03   Vonita Moss  . Echo multiple findings  01/18/10  . ESOPHAGOGASTRODUODENOSCOPY  12/05/03   postiive H-Pylori, H.H., stricture   . FLEXIBLE SIGMOIDOSCOPY  1/98  . hemmoroidectoy    . herniorraphy     x2  . [facility]osp[facility] R/O  01/06/04  . poypectomy    . stent in distal circum  04/17/02        Home Medications    Prior to Admission medications   Medication Sig Start Date End Date Taking? Authorizing Provider  atorvastatin (LIPITOR) 80 MG tablet TAKE 1 TABLET BY MOUTH EVERY DAY 10/04/18   End, Cristal Deer, MD  ferrous sulfate 325 (65 FE) MG tablet Take 325 mg by mouth daily with breakfast.      [provider]  furosemide (LASIX) 40 MG tablet TAKE 1 TABLET(40 MG) BY MOUTH DAILY 10/19/18   Laurey Morale, MD  potassium chloride SA (K-DUR,KLOR-CON) 20 MEQ tablet Take 1 tablet (20 mEq total) by mouth daily. 01/25/18   End, Cristal Deer, MD  ramipril (ALTACE) 2.5 MG capsule TAKE 1 CAPSULE(2.5 MG) BY MOUTH DAILY 01/27/18   End, Cristal Deer, MD  vitamin B-12 (CYANOCOBALAMIN) 1000 MCG tablet  Take 1 tablet (1,000 mcg total) by mouth daily. 05/20/16   Joaquim Nam, MD  XARELTO 15 MG TABS tablet TAKE 1 TABLET(15 MG) BY MOUTH DAILY WITH SUPPER 07/12/18   End, Cristal Deer, MD    Family History Family History  Problem Relation Age of Onset  . Hypertension Mother   . Leukemia Mother   . Lung cancer Brother        smoking  . Heart Problems Brother        smoker  . Deafness Brother     Social History Social History   Tobacco Use  . Smoking status: Never Smoker  . Smokeless tobacco: Never Used  Substance Use Topics  . Alcohol use: No  . Drug use: No     Allergies   Namenda [memantine hcl]; Naproxen; and Penicillins   Review of Systems  Review of Systems  Unable to perform ROS: Dementia     Physical Exam Updated Vital Signs BP 98/71   Pulse (!) 109   Temp 98 F (36.7 C) (Oral)   Resp 16   SpO2 93%   Physical Exam Vitals signs and nursing note reviewed.  Constitutional:      General: He is not in acute distress.    Appearance: Normal appearance. He is well-developed and normal weight. He is ill-appearing. He is not diaphoretic.     Comments: Chronically ill-appearing but in no acute distress.  Patient is alert but not oriented  HENT:     Head: Normocephalic and atraumatic.     Mouth/Throat:     Mouth: Mucous membranes are dry.     Comments: Mucous membranes dry, oropharynx clear. Eyes:     General:        Right eye: No discharge.        Left eye: No discharge.     Extraocular Movements: Extraocular movements intact.     Pupils: Pupils are equal, round, and reactive to light.  Neck:     Musculoskeletal: Neck supple.  Cardiovascular:     Rate and Rhythm: Regular rhythm. Tachycardia present.     Pulses: Normal pulses.     Heart sounds: Normal heart sounds. No murmur. No friction rub. No gallop.   Pulmonary:     Effort: Pulmonary effort is normal. No respiratory distress.     Breath sounds: Normal breath sounds.     Comments: Respirations equal  and unlabored, patient able to speak, on arrival patient satting in the low 90s on room air, provided with 2 L nasal cannula, occasional cough, lungs clear to auscultation bilaterally Abdominal:     General: Abdomen is flat. Bowel sounds are normal.     Palpations: Abdomen is soft.     Comments: Abdomen soft, nondistended, nontender to palpation in all quadrants without guarding or peritoneal signs  Musculoskeletal:        General: No deformity.     Right lower leg: No edema.  Skin:    General: Skin is warm and dry.     Findings: No rash.  Neurological:     Mental Status: He is alert.     Coordination: Coordination normal.     Comments: Patient alert and oriented to self but not place or situation, not following most commands.  Does spontaneously move all extremities.  Neurologic exam limited by dementia and altered mental status.  Psychiatric:        Mood and Affect: Mood normal.        Behavior: Behavior normal.      ED Treatments / Results  Labs (all labs ordered are listed, but only abnormal results are displayed) Labs Reviewed  COMPREHENSIVE METABOLIC PANEL - Abnormal; Notable for the following components:      Result Value   Sodium 159 (*)    Chloride 120 (*)    Glucose, Bld 138 (*)    BUN 79 (*)    Creatinine, Ser 2.58 (*)    Albumin 3.1 (*)    AST 53 (*)    GFR calc non Af Amer 21 (*)    GFR calc Af Amer 24 (*)    All other components within normal limits  CBC WITH DIFFERENTIAL/PLATELET - Abnormal; Notable for the following components:   WBC 12.5 (*)    HCT 52.9 (*)    MCV 103.1 (*)    MCHC 29.9 (*)  Neutro Abs 11.2 (*)    All other components within normal limits  URINALYSIS, ROUTINE W REFLEX MICROSCOPIC - Abnormal; Notable for the following components:   Hgb urine dipstick MODERATE (*)    Bacteria, UA RARE (*)    All other components within normal limits  LACTIC ACID, PLASMA - Abnormal; Notable for the following components:   Lactic Acid, Venous 2.3 (*)      All other components within normal limits  POC OCCULT BLOOD, ED - Abnormal; Notable for the following components:   Fecal Occult Bld POSITIVE (*)    All other components within normal limits  URINE CULTURE  CULTURE, BLOOD (ROUTINE X 2)  CULTURE, BLOOD (ROUTINE X 2)  LACTIC ACID, PLASMA  TYPE AND SCREEN  ABO/RH    EKG None  Radiology Ct Head Wo Contrast  Result Date: 11/14/2018 CLINICAL DATA:  Altered level of consciousness. EXAM: CT HEAD WITHOUT CONTRAST TECHNIQUE: Contiguous axial images were obtained from the base of the skull through the vertex without intravenous contrast. COMPARISON:  09/04/2017 FINDINGS: Brain: There is no evidence for acute hemorrhage, hydrocephalus, mass lesion, or abnormal extra-axial fluid collection. No definite CT evidence for acute infarction. Diffuse loss of parenchymal volume is consistent with atrophy. Patchy low attenuation in the deep hemispheric and periventricular white matter is nonspecific, but likely reflects chronic microvascular ischemic demyelination. Vascular: No hyperdense vessel or unexpected calcification. Skull: No evidence for fracture. No worrisome lytic or sclerotic lesion. Sinuses/Orbits: The visualized paranasal sinuses and right mastoid air cells are clear. Fluid noted in posterior left mastoid air cells. Visualized portions of the globes and intraorbital fat are unremarkable. Other: None. IMPRESSION: 1. No acute intracranial abnormality. 2. Atrophy with chronic small vessel white matter ischemic disease. Electronically Signed   By: Kennith Center M.D.   On: 11/14/2018 18:31   Dg Chest Portable 1 View  Result Date: 11/14/2018 CLINICAL DATA:  Dementia.  Decreased appetite. EXAM: PORTABLE CHEST 1 VIEW COMPARISON:  09/04/2017 FINDINGS: Right lung is clear. Patchy airspace disease noted left base. Interstitial markings are diffusely coarsened with chronic features. The cardio pericardial silhouette is enlarged. Bones are diffusely  demineralized. Face obscures the lung apices. IMPRESSION: Probable emphysema with left base patchy airspace disease suggesting pneumonia. Electronically Signed   By: Kennith Center M.D.   On: 11/14/2018 17:56    Procedures .Critical Care Performed by: Dartha Lodge, PA-C Authorized by: Dartha Lodge, PA-C   Critical care provider statement:    Critical care time (minutes):  45   Critical care time was exclusive of:  Separately billable procedures and treating other patients   Critical care was necessary to treat or prevent imminent or life-threatening deterioration of the following conditions:  Respiratory failure and metabolic crisis   Critical care was time spent personally by me on the following activities:  Discussions with consultants, evaluation of patient's response to treatment, examination of patient, ordering and performing treatments and interventions, ordering and review of laboratory studies, ordering and review of radiographic studies, pulse oximetry, re-evaluation of patient's condition, obtaining history from patient or surrogate and review of old charts   I assumed direction of critical care for this patient from another provider in my specialty: no     (including critical care time)  Medications Ordered in ED Medications  doxycycline (VIBRAMYCIN) 100 mg in sodium chloride 0.9 % 250 mL IVPB (100 mg Intravenous New Bag/Given 11/14/18 2134)  sodium chloride 0.9 % bolus 1,000 mL (0 mLs Intravenous Stopped 11/14/18 1909)  sodium chloride 0.9 % bolus 1,000 mL (0 mLs Intravenous Stopped 11/14/18 2210)  cefTRIAXone (ROCEPHIN) 1 g in sodium chloride 0.9 % 100 mL IVPB (0 g Intravenous Stopped 11/14/18 2136)     Initial Impression / Assessment and Plan / ED Course  I have reviewed the triage vital signs and the nursing notes.  Pertinent labs & imaging results that were available during my care of the patient were reviewed by me and considered in my medical decision making (see chart  for details).  Patient presents from primary care office for further evaluation of altered mental status over the past week.  Patient reported to be less verbal and active than usual and not eating and drinking well.  Patient noted to have several episodes of dark stools at home, not complaining of any abdominal pain has not had any bright red blood per rectum and family denies any vomiting or hematemesis.  Patient has had occasional cough but has not had noted fevers at home.  On arrival patient tachycardic but afebrile and vitals otherwise stable.  He appears very dry and dehydrated on exam and somewhat pale.  Patient is alert and talking intermittently but is confused, oriented to self but not place or situation.  Difficult to obtain complete neurologic exam due to poor patient cooperation but he appears to be moving all extremities spontaneously, pupils are equal and reactive and he does not appear to have one-sided neglect.  Differential includes GI bleed and hypovolemia leading to confusion and fatigue, infection, versus acute bleed or stroke although neuro exam is not clearly indicative of stroke.  Will get CBC, CMP, Hemoccult, type and screen, urinalysis, chest x-ray, lactic acid, and CT of the head.  Will give 1 L fluid bolus to start as patient is clearly dehydrated on exam.    There is no gross blood on rectal exam, but Hemoccult is positive.  Patient is currently on Xarelto but is not actively bleeding at this time, no bright red blood per rectum and no hematemesis.  Hemoglobin is normal at 15.8.  I do not feel that patient will require reversal of anticoagulants at this time but will continue to monitor closely.  Lactic acid slightly elevated at 2.3, urinalysis without signs of infection but chest x-ray does show probable emphysema with a left lower lobe patchy infiltrate concerning for pneumonia.  Patient has had intermittent cough and was initially satting in the low 90s which is improved with 2  L nasal cannula, given this we will treat for community-acquired pneumonia with doxycycline given patient's history of prolonged QT and Rocephin.  Patient with multiple electrolyte abnormalities, sodium elevated at 159, chloride elevated as well.  Creatinine 2.85, and baseline appears to be 1.2.  LFTs unremarkable.  Patient with AKI likely in the setting of dehydration will give additional 1 L fluid bolus.  Tachycardia improving already.  CT head with no acute abnormalities.  Patient with decreased appetite and activity and some increased confusion with baseline dementia, this is likely multifactorial in the setting of dehydration and GI bleeding causing hypovolemia, acute kidney injury and community-acquired pneumonia.  Patient will require admission for further treatment and evaluation.  I discussed CODE STATUS with patient's daughters who are at the bedside and they feel that they would most likely like for patient to be DNR/DNI but they would like to speak with her other sister who is not present at this time before determining this officially.  Case discussed with Dr. Toniann FailKakrakandy with [facility] hospital[facility] who  will see and admit the patient.   Final Clinical Impressions(s) / ED Diagnoses   Final diagnoses:  Altered mental status, unspecified altered mental status type  AKI (acute kidney injury) (HCC)  Dehydration  Gastrointestinal hemorrhage, unspecified gastrointestinal hemorrhage type  Hypernatremia  Community acquired pneumonia of left lower lobe of lung [facility])    ED Discharge Orders    None       Legrand Rams 11/15/18 0033    Lorre Nick, MD 11/17/18 1339

## 2018-11-14 NOTE — ED Notes (Signed)
Selected wrong pt.

## 2018-11-14 NOTE — ED Notes (Signed)
Dementia Pt, keeps pulling on lines and id bracelet, could not read, will reprint new id band.

## 2018-11-14 NOTE — Assessment & Plan Note (Addendum)
Started on O2 2L.  911 called.   Could be from dehydration, CVA, infection, blood loss with black stools, etc.  Sx started in the last 6 days.   D/w family.   EMS in route for transfer.  Patient monitored in meantime.   >25 minutes spent in face to face time with patient, >50% spent in counselling or coordination of care.

## 2018-11-14 NOTE — ED Notes (Signed)
I/O cath was done by Daphine Deutscher and [employer].  Pt was properly  Cleaned.

## 2018-11-14 NOTE — ED Triage Notes (Signed)
EMS reports from MD office, family urged in visit due to decreased activity and appetite, failure to thrive x a week. Family reported dark stools. MD advised ED evaluation. Hx of Dementia  BP 120/92 HR 100 RR 16 Sp02  CBG 153

## 2018-11-14 NOTE — H&P (Signed)
History and Physical    Tony Stackdward L Delaughter ZOX:[id number] DOB: 1926/01/07 DOA: 11/14/2018  PCP: Joaquim Namuncan, Graham S, MD  Patient coming from: Home.  Chief Complaint: Confusion and decreased p.o. intake.  HPI: Tony Reed is a 85 y.o. male with history of CAD status post PCI, diastolic CHF, atrial fibrillation, dementia, chronic kidney disease, chronic anemia was brought to the ER after patient was found to have increasing confusion.  As per the family patient has not been eating well for the last 5 to 6 days.  A week ago patient had some diarrhea.  Also has been having some cough productive cough.  Patient has become more confused and was taken to the primary care office and was referred to the ER for further work-up.  Patient has been taking his medications but not eating well.  Has not had any vomiting.  Did not complain of any chest pain or shortness of breath or abdominal pain.  Patient used to ambulatory but has not done much last few days.  Per family patient did not have any witnessed fall.  Over the last few days patient also has noticed to have some dark stools.  Does not take any NSAIDs but patient is on Xarelto for A. fib.  ED Course: In the ER patient has periods of confusion.  Able to move all extremities follows commands.  CT head was unremarkable.  Chest x-ray shows possibility of pneumonia.  UA is unremarkable.  Patient was afebrile.  Stool for occult blood was positive.  Hemoglobin is around 15.  Creatinine has increased from 1.25 two months ago to 2.5.  Sodium is 159 WBC count is 12.5.  Patient was given fluid bolus due to dehydration and started on D5W for hypernatremia with renal failure.  Protonix started for GI bleed.  Patient admitted for acute encephalopathy with acute renal failure hyponatremia GI bleed and pneumonia.  Review of Systems: As per HPI, rest all negative.   Past Medical History:  Diagnosis Date  . CAD (coronary artery disease)    inferior MI in 2003 w  BMS to RCA. DES to CFX in 2008.  Normal Myoviews in 5/13.  Marland Kitchen. CHF (congestive heart failure) (HCC)    diastolic: Echo (4/11) w EF 50-60%, moderate MR, moderate to sever LAE, severe RAE, moderate to sever TR  . CKD (chronic kidney disease)   . Diverticulitis, colon   . GERD (gastroesophageal reflux disease)   . Hyperlipidemia   . Hypertension   . Leg edema    L>R chronically  . Leg pain    and decrease pulses consistent w peripheral vasc disease  . Memory difficulty   . Osteoporosis    s/p 5+ years of fosamax treatment as of 2013  . Paroxysmal atrial fibrillation (HCC)    w history of DCCV  . UTI (urinary tract infection)    with altered mentation 2018    Past Surgical History:  Procedure Laterality Date  . adenosine cardiolite  10/03/02   old infarct inf o/w negative EF 61% 10/03/02  . CARDIAC CATHETERIZATION  1986   normal  . CARDIOVERSION  02/03/10   successful on Mutaq (Dr. Juanda ChanceBrodie)   . Cath  06/25/09   nonobstructive disease afib (Dr. Juanda ChanceBrodie)  . Cath, stents  09/27/02  . choleycystectomy  1996  . COLONOSCOPY  12/13/08   mod divertics no polyps (Dr Juanda ChanceBrodie)  . CT  11/21/03   abd mod H. H., CT pelvis elevated prostate  . CYSTOSCOPY  09/26/03  Vonita MossPeterson  . Echo multiple findings  01/18/10  . ESOPHAGOGASTRODUODENOSCOPY  12/05/03   postiive H-Pylori, H.H., stricture   . FLEXIBLE SIGMOIDOSCOPY  1/98  . hemmoroidectoy    . herniorraphy     x2  . [facility] R/O  01/06/04  . poypectomy    . stent in distal circum  04/17/02     reports that he has never smoked. He has never used smokeless tobacco. He reports that he does not drink alcohol or use drugs.  Allergies  Allergen Reactions  . Namenda [Memantine Hcl] Other (See Comments)    diarrhea  . Naproxen Other (See Comments)    ABD PAIN, DIARRHEA  . Penicillins Other (See Comments)    Was able to tolerate keflex 08/2017    Family History  Problem Relation Age of Onset  . Hypertension Mother   . Leukemia Mother   . Lung  cancer Brother        smoking  . Heart Problems Brother        smoker  . Deafness Brother     Prior to Admission medications   Medication Sig Start Date End Date Taking? Authorizing Provider  atorvastatin (LIPITOR) 80 MG tablet TAKE 1 TABLET BY MOUTH EVERY DAY 10/04/18  Yes End, Cristal Deerhristopher, MD  ferrous sulfate 325 (65 FE) MG tablet Take 325 mg by mouth daily with breakfast.     Yes [provider]  furosemide (LASIX) 40 MG tablet TAKE 1 TABLET(40 MG) BY MOUTH DAILY 10/19/18  Yes Laurey MoraleMcLean, Dalton S, MD  potassium chloride SA (K-DUR,KLOR-CON) 20 MEQ tablet Take 1 tablet (20 mEq total) by mouth daily. 01/25/18  Yes End, Cristal Deerhristopher, MD  ramipril (ALTACE) 2.5 MG capsule TAKE 1 CAPSULE(2.5 MG) BY MOUTH DAILY 01/27/18  Yes End, Cristal Deerhristopher, MD  vitamin B-12 (CYANOCOBALAMIN) 1000 MCG tablet Take 1 tablet (1,000 mcg total) by mouth daily. 05/20/16  Yes Joaquim Namuncan, Graham S, MD  XARELTO 15 MG TABS tablet TAKE 1 TABLET(15 MG) BY MOUTH DAILY WITH SUPPER 07/12/18  Yes End, Cristal Deerhristopher, MD    Physical Exam: Vitals:   11/14/18 1756 11/14/18 1830 11/14/18 1847 11/14/18 1900  BP: (!) 117/91 113/74 113/74 115/80  Pulse:   (!) 109   Resp: (!) 22 (!) 27 20 (!) 26  Temp:      TempSrc:      SpO2:   99%       Constitutional: Moderately built and nourished. Vitals:   11/14/18 1756 11/14/18 1830 11/14/18 1847 11/14/18 1900  BP: (!) 117/91 113/74 113/74 115/80  Pulse:   (!) 109   Resp: (!) 22 (!) 27 20 (!) 26  Temp:      TempSrc:      SpO2:   99%    Eyes: Anicteric no pallor. ENMT: No discharge from the ears eyes nose or mouth. Neck: No mass felt.  No neck rigidity. Respiratory: No rhonchi or crepitations. Cardiovascular: S1-S2 heard. Abdomen: Soft nontender bowel sounds present. Musculoskeletal: No edema.  No joint effusion. Skin: No rash. Neurologic: Alert awake but at times confused.  Oriented to his name able to recognize his daughters.  Moves all extremities. Psychiatric: Appears  confused.   Labs on Admission: I have personally reviewed following labs and imaging studies  CBC: Recent Labs  Lab 11/14/18 1730  WBC 12.5*  NEUTROABS 11.2*  HGB 15.8  HCT 52.9*  MCV 103.1*  PLT 308   Basic Metabolic Panel: Recent Labs  Lab 11/14/18 1730  NA 159*  K 3.6  CL 120*  CO2 28  GLUCOSE 138*  BUN 79*  CREATININE 2.58*  CALCIUM 9.3   GFR: CrCl cannot be calculated (Unknown ideal weight.). Liver Function Tests: Recent Labs  Lab 11/14/18 1730  AST 53*  ALT 31  ALKPHOS 82  BILITOT 1.2  PROT 8.0  ALBUMIN 3.1*   No results for input(s): LIPASE, AMYLASE in the last 168 hours. No results for input(s): AMMONIA in the last 168 hours. Coagulation Profile: No results for input(s): INR, PROTIME in the last 168 hours. Cardiac Enzymes: No results for input(s): CKTOTAL, CKMB, CKMBINDEX, TROPONINI in the last 168 hours. BNP (last 3 results) No results for input(s): PROBNP in the last 8760 hours. HbA1C: No results for input(s): HGBA1C in the last 72 hours. CBG: No results for input(s): GLUCAP in the last 168 hours. Lipid Profile: No results for input(s): CHOL, HDL, LDLCALC, TRIG, CHOLHDL, LDLDIRECT in the last 72 hours. Thyroid Function Tests: No results for input(s): TSH, T4TOTAL, FREET4, T3FREE, THYROIDAB in the last 72 hours. Anemia Panel: No results for input(s): VITAMINB12, FOLATE, FERRITIN, TIBC, IRON, RETICCTPCT in the last 72 hours. Urine analysis:    Component Value Date/Time   COLORURINE YELLOW 11/14/2018 1801   APPEARANCEUR CLEAR 11/14/2018 1801   LABSPEC 1.014 11/14/2018 1801   PHURINE 5.0 11/14/2018 1801   GLUCOSEU NEGATIVE 11/14/2018 1801   HGBUR MODERATE (A) 11/14/2018 1801   HGBUR large 01/29/2009 1157   BILIRUBINUR NEGATIVE 11/14/2018 1801   BILIRUBINUR neg 01/18/2013 1635   KETONESUR NEGATIVE 11/14/2018 1801   PROTEINUR NEGATIVE 11/14/2018 1801   UROBILINOGEN 0.2 01/18/2013 1635   UROBILINOGEN 0.2 01/29/2009 1157   NITRITE NEGATIVE  11/14/2018 1801   LEUKOCYTESUR NEGATIVE 11/14/2018 1801   Sepsis Labs: @LABRCNTIP (procalcitonin:4,lacticidven:4) )No results found for this or any previous visit (from the past 240 hour(s)).   Radiological Exams on Admission: Ct Head Wo Contrast  Result Date: 11/14/2018 CLINICAL DATA:  Altered level of consciousness. EXAM: CT HEAD WITHOUT CONTRAST TECHNIQUE: Contiguous axial images were obtained from the base of the skull through the vertex without intravenous contrast. COMPARISON:  09/04/2017 FINDINGS: Brain: There is no evidence for acute hemorrhage, hydrocephalus, mass lesion, or abnormal extra-axial fluid collection. No definite CT evidence for acute infarction. Diffuse loss of parenchymal volume is consistent with atrophy. Patchy low attenuation in the deep hemispheric and periventricular white matter is nonspecific, but likely reflects chronic microvascular ischemic demyelination. Vascular: No hyperdense vessel or unexpected calcification. Skull: No evidence for fracture. No worrisome lytic or sclerotic lesion. Sinuses/Orbits: The visualized paranasal sinuses and right mastoid air cells are clear. Fluid noted in posterior left mastoid air cells. Visualized portions of the globes and intraorbital fat are unremarkable. Other: None. IMPRESSION: 1. No acute intracranial abnormality. 2. Atrophy with chronic small vessel white matter ischemic disease. Electronically Signed   By: Kennith Center M.D.   On: 11/14/2018 18:31   Dg Chest Portable 1 View  Result Date: 11/14/2018 CLINICAL DATA:  Dementia.  Decreased appetite. EXAM: PORTABLE CHEST 1 VIEW COMPARISON:  09/04/2017 FINDINGS: Right lung is clear. Patchy airspace disease noted left base. Interstitial markings are diffusely coarsened with chronic features. The cardio pericardial silhouette is enlarged. Bones are diffusely demineralized. Face obscures the lung apices. IMPRESSION: Probable emphysema with left base patchy airspace disease suggesting  pneumonia. Electronically Signed   By: Kennith Center M.D.   On: 11/14/2018 17:56    EKG: Independently reviewed.  A. fib rate around 109 bpm.  Assessment/Plan Principal Problem:   ARF (acute renal failure) (  HCC) Active Problems:   Atrial fibrillation (HCC)   Chronic heart failure with preserved ejection fraction (HCC)   Hypernatremia   Pneumonia   Acute GI bleeding    1. Acute on chronic kidney disease with hypernatremia secondary to poor oral intake -patient was given fluid bolus in the ER I have continued patient on D5W.  Closely follow metabolic panel intake output.  Exact reason for poor oral intake is not clear.  May need further work-up.  Abdomen appears benign at this time. 2. GI bleed -patient is on Protonix infusion.  Follow serial CBCs.  N.p.o. for now holding Xarelto after discussing with patient's daughter they are agreement.  Risk of stroke has been explained. 3. A. fib presently rate controlled.  Xarelto on hold for GI bleed after discussing with patient's daughter and the risk of stroke explained to the family they are agreeable. 4. Pneumonia on empiric antibiotics.  May need swallow evaluation. 5. CAD denies any chest pain. 6. Dementia presently in acute encephalopathy likely from metabolic reasons and possible pneumonia. 7. History of diastolic CHF presently receiving fluids for dehydration.  Had extensive discussion with patient's daughter and at this time we will be continuing the present medication.  If patient does not make any reasonable improvement in the next 24 hours of there is further decline in patient's status then the plan is to move towards comfort measures.   DVT prophylaxis: SCDs due to GI bleed. Code Status: DNR. Family Communication: Patient's daughter. Disposition Plan: Home. Consults called: None. Admission status: Observation.   Eduard Clos MD [facility] Pager [phone].  If 7PM-7AM, please contact  night-coverage [email or web address] Password [facility]  11/14/2018, 10:41 PM

## 2018-11-14 NOTE — Progress Notes (Signed)
85 y/o male with known h/o dementia with sx for about the last 6 days.  Dec in appetite.  Dark black stools, with occ diarrhea.  Drowsy recently.  Some prev cough.  Speech changes (mumbling more) and some hallucinations in the last few days.  Brought in for eval.   No ROS due to dementia.   PMH and SH reviewed  ROS: Per HPI unless specifically indicated in ROS section   Meds, vitals, and allergies reviewed.   Elderly male in wheelchair, he is looking downward at his hands.  Not in apparent distress.  He isn't conversant like his prev baseline.  Minimal speech.   Responds to stimuli but not commands, doesn't follow directions.  Will not open eyes or mouth on command  Neck supple, no LA noted.  Tachy with occ ectopy.  No focal dec in BS No inc in wob abd soft Ext w/o edema He moves arms and legs B  Pulse ox wouldn't initially register.  Intact radial pulse and normal cap refill but his hands are cold and that could have artificially decrease his pulse ox.

## 2018-11-14 NOTE — ED Notes (Signed)
Bed: WA02 Expected date:  Expected time:  Means of arrival:  Comments: EMS 85yo from PCP, fatigue, dark stools

## 2018-11-15 ENCOUNTER — Other Ambulatory Visit: Payer: Self-pay

## 2018-11-15 DIAGNOSIS — I5032 Chronic diastolic (congestive) heart failure: Secondary | ICD-10-CM | POA: Diagnosis not present

## 2018-11-15 DIAGNOSIS — R410 Disorientation, unspecified: Secondary | ICD-10-CM | POA: Diagnosis not present

## 2018-11-15 DIAGNOSIS — Z87898 Personal history of other specified conditions: Secondary | ICD-10-CM | POA: Diagnosis not present

## 2018-11-15 DIAGNOSIS — I130 Hypertensive heart and chronic kidney disease with heart failure and stage 1 through stage 4 chronic kidney disease, or unspecified chronic kidney disease: Secondary | ICD-10-CM | POA: Diagnosis not present

## 2018-11-15 DIAGNOSIS — Z7901 Long term (current) use of anticoagulants: Secondary | ICD-10-CM | POA: Diagnosis not present

## 2018-11-15 DIAGNOSIS — I4821 Permanent atrial fibrillation: Secondary | ICD-10-CM

## 2018-11-15 DIAGNOSIS — I2510 Atherosclerotic heart disease of native coronary artery without angina pectoris: Secondary | ICD-10-CM | POA: Diagnosis not present

## 2018-11-15 DIAGNOSIS — N179 Acute kidney failure, unspecified: Secondary | ICD-10-CM | POA: Diagnosis not present

## 2018-11-15 DIAGNOSIS — E440 Moderate protein-calorie malnutrition: Secondary | ICD-10-CM

## 2018-11-15 DIAGNOSIS — Z8249 Family history of ischemic heart disease and other diseases of the circulatory system: Secondary | ICD-10-CM | POA: Diagnosis not present

## 2018-11-15 DIAGNOSIS — R4182 Altered mental status, unspecified: Secondary | ICD-10-CM | POA: Diagnosis present

## 2018-11-15 DIAGNOSIS — N183 Chronic kidney disease, stage 3 (moderate): Secondary | ICD-10-CM | POA: Diagnosis not present

## 2018-11-15 DIAGNOSIS — Z7401 Bed confinement status: Secondary | ICD-10-CM | POA: Diagnosis not present

## 2018-11-15 DIAGNOSIS — E871 Hypo-osmolality and hyponatremia: Secondary | ICD-10-CM | POA: Diagnosis not present

## 2018-11-15 DIAGNOSIS — R195 Other fecal abnormalities: Secondary | ICD-10-CM | POA: Diagnosis not present

## 2018-11-15 DIAGNOSIS — E785 Hyperlipidemia, unspecified: Secondary | ICD-10-CM | POA: Diagnosis not present

## 2018-11-15 DIAGNOSIS — H25812 Combined forms of age-related cataract, left eye: Secondary | ICD-10-CM | POA: Diagnosis not present

## 2018-11-15 DIAGNOSIS — Z7189 Other specified counseling: Secondary | ICD-10-CM | POA: Diagnosis not present

## 2018-11-15 DIAGNOSIS — R2681 Unsteadiness on feet: Secondary | ICD-10-CM | POA: Diagnosis not present

## 2018-11-15 DIAGNOSIS — K922 Gastrointestinal hemorrhage, unspecified: Secondary | ICD-10-CM | POA: Diagnosis not present

## 2018-11-15 DIAGNOSIS — Z806 Family history of leukemia: Secondary | ICD-10-CM | POA: Diagnosis not present

## 2018-11-15 DIAGNOSIS — I482 Chronic atrial fibrillation, unspecified: Secondary | ICD-10-CM | POA: Diagnosis not present

## 2018-11-15 DIAGNOSIS — K219 Gastro-esophageal reflux disease without esophagitis: Secondary | ICD-10-CM | POA: Diagnosis present

## 2018-11-15 DIAGNOSIS — M810 Age-related osteoporosis without current pathological fracture: Secondary | ICD-10-CM | POA: Diagnosis not present

## 2018-11-15 DIAGNOSIS — Z801 Family history of malignant neoplasm of trachea, bronchus and lung: Secondary | ICD-10-CM | POA: Diagnosis not present

## 2018-11-15 DIAGNOSIS — E870 Hyperosmolality and hypernatremia: Secondary | ICD-10-CM | POA: Diagnosis not present

## 2018-11-15 DIAGNOSIS — Z515 Encounter for palliative care: Secondary | ICD-10-CM | POA: Diagnosis not present

## 2018-11-15 DIAGNOSIS — J80 Acute respiratory distress syndrome: Secondary | ICD-10-CM | POA: Diagnosis not present

## 2018-11-15 DIAGNOSIS — J189 Pneumonia, unspecified organism: Secondary | ICD-10-CM | POA: Diagnosis not present

## 2018-11-15 DIAGNOSIS — Z886 Allergy status to analgesic agent status: Secondary | ICD-10-CM | POA: Diagnosis not present

## 2018-11-15 DIAGNOSIS — M6281 Muscle weakness (generalized): Secondary | ICD-10-CM | POA: Diagnosis not present

## 2018-11-15 DIAGNOSIS — R627 Adult failure to thrive: Secondary | ICD-10-CM | POA: Diagnosis present

## 2018-11-15 DIAGNOSIS — I480 Paroxysmal atrial fibrillation: Secondary | ICD-10-CM | POA: Diagnosis present

## 2018-11-15 DIAGNOSIS — G9341 Metabolic encephalopathy: Secondary | ICD-10-CM | POA: Diagnosis not present

## 2018-11-15 DIAGNOSIS — R1312 Dysphagia, oropharyngeal phase: Secondary | ICD-10-CM | POA: Diagnosis not present

## 2018-11-15 DIAGNOSIS — M2550 Pain in unspecified joint: Secondary | ICD-10-CM | POA: Diagnosis not present

## 2018-11-15 DIAGNOSIS — E860 Dehydration: Secondary | ICD-10-CM | POA: Diagnosis present

## 2018-11-15 DIAGNOSIS — K5730 Diverticulosis of large intestine without perforation or abscess without bleeding: Secondary | ICD-10-CM | POA: Diagnosis not present

## 2018-11-15 DIAGNOSIS — F0390 Unspecified dementia without behavioral disturbance: Secondary | ICD-10-CM | POA: Diagnosis not present

## 2018-11-15 DIAGNOSIS — R000 Tachycardia, unspecified: Secondary | ICD-10-CM | POA: Diagnosis not present

## 2018-11-15 DIAGNOSIS — Z880 Allergy status to penicillin: Secondary | ICD-10-CM | POA: Diagnosis not present

## 2018-11-15 LAB — CBC
HCT: 44.6 % (ref 39.0–52.0)
HCT: 41.5 % (ref 39.0–52.0)
HEMATOCRIT: 42.6 % (ref 39.0–52.0)
Hemoglobin: 13.2 g/dL (ref 13.0–17.0)
Hemoglobin: 12.2 g/dL — ABNORMAL LOW (ref 13.0–17.0)
Hemoglobin: 12.6 g/dL — ABNORMAL LOW (ref 13.0–17.0)
MCH: 31 pg (ref 26.0–34.0)
MCH: 31.4 pg (ref 26.0–34.0)
MCH: 31 pg (ref 26.0–34.0)
MCHC: 29.6 g/dL — AB (ref 30.0–36.0)
MCHC: 29.4 g/dL — ABNORMAL LOW (ref 30.0–36.0)
MCHC: 29.6 g/dL — ABNORMAL LOW (ref 30.0–36.0)
MCV: 104.7 fL — ABNORMAL HIGH (ref 80.0–100.0)
MCV: 107 fL — ABNORMAL HIGH (ref 80.0–100.0)
MCV: 104.9 fL — AB (ref 80.0–100.0)
PLATELETS: 205 10*3/uL (ref 150–400)
Platelets: 211 10*3/uL (ref 150–400)
Platelets: 202 10*3/uL (ref 150–400)
RBC: 4.26 MIL/uL (ref 4.22–5.81)
RBC: 3.88 MIL/uL — ABNORMAL LOW (ref 4.22–5.81)
RBC: 4.06 MIL/uL — ABNORMAL LOW (ref 4.22–5.81)
RDW: 13.5 % (ref 11.5–15.5)
RDW: 13.6 % (ref 11.5–15.5)
RDW: 13.5 % (ref 11.5–15.5)
WBC: 11.9 10*3/uL — ABNORMAL HIGH (ref 4.0–10.5)
WBC: 12 10*3/uL — AB (ref 4.0–10.5)
WBC: 11.7 10*3/uL — ABNORMAL HIGH (ref 4.0–10.5)
nRBC: 0 % (ref 0.0–0.2)
nRBC: 0 % (ref 0.0–0.2)
nRBC: 0 % (ref 0.0–0.2)

## 2018-11-15 LAB — BASIC METABOLIC PANEL
Anion gap: 7 (ref 5–15)
BUN: 70 mg/dL — ABNORMAL HIGH (ref 8–23)
CALCIUM: 8 mg/dL — AB (ref 8.9–10.3)
CO2: 23 mmol/L (ref 22–32)
Chloride: 126 mmol/L — ABNORMAL HIGH (ref 98–111)
Creatinine, Ser: 2.11 mg/dL — ABNORMAL HIGH (ref 0.61–1.24)
GFR calc Af Amer: 31 mL/min — ABNORMAL LOW (ref >60)
GFR, EST NON AFRICAN AMERICAN: 27 mL/min — AB (ref >60)
Glucose, Bld: 147 mg/dL — ABNORMAL HIGH (ref 70–99)
Potassium: 3.6 mmol/L (ref 3.5–5.1)
Sodium: 156 mmol/L — ABNORMAL HIGH (ref 135–145)

## 2018-11-15 LAB — LACTIC ACID, PLASMA: Lactic Acid, Venous: 1.8 mmol/L (ref 0.5–1.9)

## 2018-11-15 MED ORDER — ADULT MULTIVITAMIN W/MINERALS CH
1.0000 | ORAL_TABLET | Freq: Every day | ORAL | Status: DC
  Administered 2018-11-15 – 2018-11-23 (×7): 1 via ORAL
  Filled 2018-11-15 (×8): qty 1

## 2018-11-15 MED ORDER — HALOPERIDOL LACTATE 5 MG/ML IJ SOLN
1.0000 mg | INTRAMUSCULAR | Status: AC | PRN
  Administered 2018-11-15 – 2018-11-16 (×2): 1 mg via INTRAVENOUS
  Filled 2018-11-15 (×2): qty 1

## 2018-11-15 MED ORDER — ENSURE ENLIVE PO LIQD
237.0000 mL | Freq: Two times a day (BID) | ORAL | Status: DC
  Administered 2018-11-15 – 2018-11-23 (×11): 237 mL via ORAL

## 2018-11-15 MED ORDER — ORAL CARE MOUTH RINSE
15.0000 mL | Freq: Two times a day (BID) | OROMUCOSAL | Status: DC
  Administered 2018-11-15 – 2018-11-21 (×9): 15 mL via OROMUCOSAL

## 2018-11-15 NOTE — ED Notes (Signed)
ED TO INPATIENT HANDOFF REPORT  Name/Age/Gender Tony Reed 85 y.o. male  Code Status    Code Status Orders  (From admission, onward)         Start     Ordered   11/14/18 2240  Do not attempt resuscitation (DNR)  Continuous    Question Answer Comment  In the event of cardiac or respiratory ARREST Do not call a "code blue"   In the event of cardiac or respiratory ARREST Do not perform Intubation, CPR, defibrillation or ACLS   In the event of cardiac or respiratory ARREST Use medication by any route, position, wound care, and other measures to relive pain and suffering. May use oxygen, suction and manual treatment of airway obstruction as needed for comfort.      11/14/18 2240        Code Status History    Date Active Date Inactive Code Status Order ID Comments User Context   11/14/2018 2126 11/14/2018 2240 DNR [id number]  Eduard Clos, MD ED   03/09/2012 1738 03/15/2012 1400 Full Code [id number]  Seleta Rhymes, RN Inpatient      Home/SNF/Other Home  Chief Complaint failure to thrieve  Level of Care/Admitting Diagnosis ED Disposition    ED Disposition Condition Comment   Admit  Hospital Area: [facility] [[id number]]  Level of Care: Telemetry [5]  Admit to tele based on following criteria: Monitor for Ischemic changes  Diagnosis: ARF (acute renal failure) [facility]) [478295]  Admitting Physician: Eduard Clos [phone]  Attending Physician: Eduard Clos Florian.Pax  PT Class (Do Not Modify): Observation [104]  PT Acc Code (Do Not Modify): Observation [10022]       Medical History Past Medical History:  Diagnosis Date  . CAD (coronary artery disease)    inferior MI in 2003 w BMS to RCA. DES to CFX in 2008.  Normal Myoviews in 5/13.  Marland Kitchen CHF (congestive heart failure) (HCC)    diastolic: Echo (4/11) w EF 50-60%, moderate MR, moderate to sever LAE, severe RAE, moderate to sever TR  . CKD (chronic kidney disease)   . Diverticulitis,  colon   . GERD (gastroesophageal reflux disease)   . Hyperlipidemia   . Hypertension   . Leg edema    L>R chronically  . Leg pain    and decrease pulses consistent w peripheral vasc disease  . Memory difficulty   . Osteoporosis    s/p 5+ years of fosamax treatment as of 2013  . Paroxysmal atrial fibrillation (HCC)    w history of DCCV  . UTI (urinary tract infection)    with altered mentation 2018    Allergies Allergies  Allergen Reactions  . Namenda [Memantine Hcl] Other (See Comments)    diarrhea  . Naproxen Other (See Comments)    ABD PAIN, DIARRHEA  . Penicillins Other (See Comments)    Was able to tolerate keflex 08/2017    IV Location/Drains/Wounds Patient Lines/Drains/Airways Status   Active Line/Drains/Airways    Name:   Placement date:   Placement time:   Site:   Days:   Peripheral IV 11/14/18 Right Antecubital   11/14/18    1735    Antecubital   1   Peripheral IV 11/14/18 Left;Lateral Forearm   11/14/18    2043    Forearm   1          Labs/Imaging Results for orders placed or performed during the hospital encounter of 11/14/18 (from the past 48 hour(s))  POC occult blood, ED Provider will collect     Status: Abnormal   Collection Time: 11/14/18  5:18 PM  Result Value Ref Range   Fecal Occult Bld POSITIVE (A) NEGATIVE  Comprehensive metabolic panel     Status: Abnormal   Collection Time: 11/14/18  5:30 PM  Result Value Ref Range   Sodium 159 (H) 135 - 145 mmol/L   Potassium 3.6 3.5 - 5.1 mmol/L   Chloride 120 (H) 98 - 111 mmol/L   CO2 28 22 - 32 mmol/L   Glucose, Bld 138 (H) 70 - 99 mg/dL   BUN 79 (H) 8 - 23 mg/dL   Creatinine, Ser 7.91 (H) 0.61 - 1.24 mg/dL   Calcium 9.3 8.9 - 50.5 mg/dL   Total Protein 8.0 6.5 - 8.1 g/dL   Albumin 3.1 (L) 3.5 - 5.0 g/dL   AST 53 (H) 15 - 41 U/L   ALT 31 0 - 44 U/L   Alkaline Phosphatase 82 38 - 126 U/L   Total Bilirubin 1.2 0.3 - 1.2 mg/dL   GFR calc non Af Amer 21 (L) >60 mL/min   GFR calc Af Amer 24 (L) >60  mL/min   Anion gap 11 5 - 15    Comment: Performed at [facility], [address], [place], [place] [address]  CBC with Differential     Status: Abnormal   Collection Time: 11/14/18  5:30 PM  Result Value Ref Range   WBC 12.5 (H) 4.0 - 10.5 K/uL   RBC 5.13 4.22 - 5.81 MIL/uL   Hemoglobin 15.8 13.0 - 17.0 g/dL   HCT 80.1 (H) 65.5 - 37.4 %   MCV 103.1 (H) 80.0 - 100.0 fL   MCH 30.8 26.0 - 34.0 pg   MCHC 29.9 (L) 30.0 - 36.0 g/dL   RDW 82.7 07.8 - 67.5 %   Platelets 308 150 - 400 K/uL   nRBC 0.0 0.0 - 0.2 %   Neutrophils Relative % 90 %   Neutro Abs 11.2 (H) 1.7 - 7.7 K/uL   Lymphocytes Relative 6 %   Lymphs Abs 0.7 0.7 - 4.0 K/uL   Monocytes Relative 4 %   Monocytes Absolute 0.5 0.1 - 1.0 K/uL   Eosinophils Relative 0 %   Eosinophils Absolute 0.0 0.0 - 0.5 K/uL   Basophils Relative 0 %   Basophils Absolute 0.0 0.0 - 0.1 K/uL   Immature Granulocytes 0 %   Abs Immature Granulocytes 0.05 0.00 - 0.07 K/uL    Comment: Performed at [facility], [address] [place], [place] [address]  Type and screen     Status: None   Collection Time: 11/14/18  5:30 PM  Result Value Ref Range   ABO/RH(D) A POS    Antibody Screen NEG    Sample Expiration      11/17/2018 Performed at [facility], [address], [place], [place] [address]   Lactic acid, plasma     Status: Abnormal   Collection Time: 11/14/18  5:30 PM  Result Value Ref Range   Lactic Acid, Venous 2.3 (HH) 0.5 - 1.9 mmol/L    Comment: CRITICAL RESULT CALLED TO, READ BACK BY AND VERIFIED WITH: M.BRILL AT 1825 ON 11/14/18 BY N.THOMPSON Performed at [facility], [address], [place], [place] [address]   ABO/Rh     Status: None   Collection Time: 11/14/18  5:30 PM  Result Value Ref Range   ABO/RH(D)  A POS Performed at [facility], [address] [place], [place] [address]   Urinalysis, Routine w reflex microscopic      Status: Abnormal   Collection Time: 11/14/18  6:01 PM  Result Value Ref Range   Color, Urine YELLOW YELLOW   APPearance CLEAR CLEAR   Specific Gravity, Urine 1.014 1.005 - 1.030   pH 5.0 5.0 - 8.0   Glucose, UA NEGATIVE NEGATIVE mg/dL   Hgb urine dipstick MODERATE (A) NEGATIVE   Bilirubin Urine NEGATIVE NEGATIVE   Ketones, ur NEGATIVE NEGATIVE mg/dL   Protein, ur NEGATIVE NEGATIVE mg/dL   Nitrite NEGATIVE NEGATIVE   Leukocytes, UA NEGATIVE NEGATIVE   RBC / HPF 6-10 0 - 5 RBC/hpf   WBC, UA 0-5 0 - 5 WBC/hpf   Bacteria, UA RARE (A) NONE SEEN    Comment: Performed at [facility], [address] [place], [place] [address]   Ct Head Wo Contrast  Result Date: 11/14/2018 CLINICAL DATA:  Altered level of consciousness. EXAM: CT HEAD WITHOUT CONTRAST TECHNIQUE: Contiguous axial images were obtained from the base of the skull through the vertex without intravenous contrast. COMPARISON:  09/04/2017 FINDINGS: Brain: There is no evidence for acute hemorrhage, hydrocephalus, mass lesion, or abnormal extra-axial fluid collection. No definite CT evidence for acute infarction. Diffuse loss of parenchymal volume is consistent with atrophy. Patchy low attenuation in the deep hemispheric and periventricular white matter is nonspecific, but likely reflects chronic microvascular ischemic demyelination. Vascular: No hyperdense vessel or unexpected calcification. Skull: No evidence for fracture. No worrisome lytic or sclerotic lesion. Sinuses/Orbits: The visualized paranasal sinuses and right mastoid air cells are clear. Fluid noted in posterior left mastoid air cells. Visualized portions of the globes and intraorbital fat are unremarkable. Other: None. IMPRESSION: 1. No acute intracranial abnormality. 2. Atrophy with chronic small vessel white matter ischemic disease. Electronically Signed   By: Kennith CenterEric  Mansell M.D.   On: 11/14/2018 18:31   Dg Chest Portable 1 View  Result Date:  11/14/2018 CLINICAL DATA:  Dementia.  Decreased appetite. EXAM: PORTABLE CHEST 1 VIEW COMPARISON:  09/04/2017 FINDINGS: Right lung is clear. Patchy airspace disease noted left base. Interstitial markings are diffusely coarsened with chronic features. The cardio pericardial silhouette is enlarged. Bones are diffusely demineralized. Face obscures the lung apices. IMPRESSION: Probable emphysema with left base patchy airspace disease suggesting pneumonia. Electronically Signed   By: Kennith CenterEric  Mansell M.D.   On: 11/14/2018 17:56   None  Pending Labs Unresulted Labs (From admission, onward)    Start     Ordered   11/15/18 0500  Basic metabolic panel  Tomorrow morning,   R     11/14/18 2240   11/14/18 2240  CBC  Now then every 4 hours,   R     11/14/18 2240   11/14/18 1859  Blood culture (routine x 2)  BLOOD CULTURE X 2,   STAT     11/14/18 1900   11/14/18 1704  Urine culture  ONCE - STAT,   STAT     11/14/18 1713   11/14/18 1704  Lactic acid, plasma  Now then every 2 hours,   STAT     11/14/18 1713          Vitals/Pain Today's Vitals   11/14/18 1847 11/14/18 1900 11/14/18 2248 11/15/18 0145  BP: 113/74 115/80 119/89 (!) 92/49  Pulse: (!) 109  80 89  Resp: 20 (!) 26 (!) 24 (!) 21  Temp:  TempSrc:      SpO2: 99%   97%  PainSc:        Isolation Precautions No active isolations  Medications Medications  dextrose 5 % solution ( Intravenous Transfusing/Transfer 11/15/18 0235)  acetaminophen (TYLENOL) tablet 650 mg (has no administration in time range)    Or  acetaminophen (TYLENOL) suppository 650 mg (has no administration in time range)  pantoprazole (PROTONIX) 80 mg in sodium chloride 0.9 % 250 mL (0.32 mg/mL) infusion (8 mg/hr Intravenous Transfusing/Transfer 11/15/18 0233)  cefTRIAXone (ROCEPHIN) 1 g in sodium chloride 0.9 % 100 mL IVPB (0 g Intravenous Stopped 11/15/18 0234)  azithromycin (ZITHROMAX) 500 mg in sodium chloride 0.9 % 250 mL IVPB (0 mg Intravenous Stopped 11/15/18  0234)  sodium chloride 0.9 % bolus 1,000 mL (0 mLs Intravenous Stopped 11/14/18 1909)  sodium chloride 0.9 % bolus 1,000 mL (0 mLs Intravenous Stopped 11/14/18 2210)  cefTRIAXone (ROCEPHIN) 1 g in sodium chloride 0.9 % 100 mL IVPB (0 g Intravenous Stopped 11/14/18 2136)  doxycycline (VIBRAMYCIN) 100 mg in sodium chloride 0.9 % 250 mL IVPB (0 mg Intravenous Stopped 11/14/18 2350)    Mobility walks with device

## 2018-11-15 NOTE — Progress Notes (Addendum)
Patient taking off mittens, IV, trying to get OOB. Paged Craige CottaKirby. New order for IV haldol and telesitter.  Will continue to monitor.

## 2018-11-15 NOTE — Progress Notes (Signed)
Initial Nutrition Assessment  DOCUMENTATION CODES:   Non-severe (moderate) malnutrition in context of chronic illness  INTERVENTION:  - Will order Ensure Enlive BID, each supplement provides 350 kcal and 20 grams of protein. - Will order Magic Cup BID with meals, each supplement provides 290 kcal and 9 grams of protein - Will order daily multivitamin with minerals. - Will monitor for plan for diet advancement vs other changes to diet. - Continue to encourage PO intakes.    NUTRITION DIAGNOSIS:   Moderate Malnutrition related to chronic illness(dementia) as evidenced by moderate fat depletion, severe muscle depletion, mild muscle depletion.  GOAL:   Patient will meet greater than or equal to 90% of their needs  MONITOR:   PO intake, Supplement acceptance, Diet advancement, Weight trends, Labs  REASON FOR ASSESSMENT:   Malnutrition Screening Tool  ASSESSMENT:   85 y.o. male with history of CAD s/p PCI, CHF, atrial fibrillation, dementia, CKD, and chronic anemia. He was taken to the ED after he was found to have increasing confusion. In the ED, family reported that patient had not been eating well for 5-6 days. They also reported that he had been experiencing a cough and diarrhea.  BMI indicates normal weight. No intakes documented since admission. Patient noted to be disoriented x4 and no family/visitors present. Breakfast tray in the room from this AM was untouched. Spoke with RN who reports that patient was given haldol shortly before this shift so he was asleep nearly all morning expect to wake up once at which time he drank a few sips of water and then went back to sleep.   Per chart review, current weight is 151 lb and weight on 09/04/18 was 161 lb. This indicates 10 lb weight loss (6% body weight) in the past 2 months.    Medications reviewed; 8 mg/hr protonix. Labs reviewed; Na: 156 mmol/l, Cl: 126 mmol/l, BUN: 70 mg/dl, creatinine: 1.612.11 mg/dl, Ca: 8 mg/dl, GFR: 27  ml/min.  IVF; D5 @ 75 ml/hr (306 kcal).      NUTRITION - FOCUSED PHYSICAL EXAM:    Most Recent Value  Orbital Region  Mild depletion  Upper Arm Region  Severe depletion  Thoracic and Lumbar Region  Unable to assess  Buccal Region  Moderate depletion  Temple Region  Moderate depletion  Clavicle Bone Region  Severe depletion  Clavicle and Acromion Bone Region  Severe depletion  Scapular Bone Region  Unable to assess  Dorsal Hand  Unable to assess [bilateral mittens]  Patellar Region  Mild depletion  Anterior Thigh Region  Unable to assess  Posterior Calf Region  Mild depletion  Edema (RD Assessment)  None  Hair  Reviewed  Eyes  Reviewed  Mouth  Reviewed  Skin  Reviewed  Nails  Unable to assess       Diet Order:   Diet Order            Diet full liquid Room service appropriate? Yes; Fluid consistency: Thin  Diet effective now              EDUCATION NEEDS:   No education needs have been identified at this time  Skin:  Skin Assessment: Reviewed RN Assessment  Last BM:  1/28  Height:   Ht Readings from Last 1 Encounters:  11/15/18 5\' 11"  (1.803 m)    Weight:   Wt Readings from Last 1 Encounters:  11/15/18 68.9 kg    Ideal Body Weight:  78.18 kg  BMI:  Body mass index is  21.19 kg/m.  Estimated Nutritional Needs:   Kcal:  1525-1725 kcal  Protein:  60-70 grams  Fluid:  >/= 1.8 L/day     Trenton GammonJessica Amany Rando, MS, RD, LDN, [facility] Inpatient Clinical Dietitian Pager # [phone] After hours/weekend pager # [phone]

## 2018-11-15 NOTE — Patient Instructions (Signed)
To ER

## 2018-11-15 NOTE — Progress Notes (Addendum)
PROGRESS NOTE    Tony Reed  RUE:[id number] DOB: [birth year] DOA: 11/14/2018 PCP: Joaquim Namuncan, Graham S, MD    Brief Narrative: Tony Stackdward L Draughon is a 85 y.o. male with history of CAD status post PCI, diastolic CHF, atrial fibrillation, dementia, chronic kidney disease, chronic anemia was brought to the ER after patient was found to have increasing confusion.  As per the family patient has not been eating well for the last 5 to 6 days.  Patient has become more confused and was taken to the primary care office and was referred to the ER for further work-up. Over the last few days patient also has noticed to have some dark stools.  Does not take any NSAIDs but patient is on Xarelto for A. Fib.   In the ER patient has periods of confusion.  Able to move all extremities follows commands.  CT head was unremarkable.  Chest x-ray shows possibility of pneumonia.  UA is unremarkable.  Patient was afebrile.  Stool for occult blood was positive.  Hemoglobin is around 15.  Creatinine has increased from 1.25 two months ago to 2.5.  Sodium is 159 WBC count is 12.5.  Patient was given fluid bolus due to dehydration and started on D5W for hypernatremia with renal failure.  Protonix started for GI bleed.  Patient admitted for acute encephalopathy with acute renal failure hyponatremia GI bleed and pneumonia.   Assessment & Plan:   Principal Problem:   ARF (acute renal failure) (HCC) Active Problems:   Atrial fibrillation (HCC)   Chronic heart failure with preserved ejection fraction (HCC)   Hypernatremia   Pneumonia   Acute GI bleeding   Acute on chronic kidney disease stage III with hypernatremia probably secondary to dehydration and poor oral intake. Sodium has improved from 159-156.  And creatinine has improved from 2.58-2.1. Continue with IV fluids at this time. Lactic acid within normal limits. Urine analysis is negative for infection.   Atrial fibrillation Rate controlled Xarelto on hold for  possible GI bleed.   Guaiac positive stools possibly blood loss from GI tract Holding Xarelto for now.   Acute metabolic encephalopathy probably secondary to hypernatremia and AKI Continue to monitor.   History of chronic diastolic heart failure Patient appears to be dehydrated will need fluids.  Disposition   Further discussions with the patient's daughter, to check for reasonable improvement in the next 24 to 48 hours and if there is further decline the plan is to move towards comfort measures.   DVT prophylaxis: SCDs Code Status: DNR Family Communication: None at bedside Disposition Plan: Pending clinical improvement  Consultants:   Palliative care consult  Procedures: None Antimicrobials: None  Subjective: Calm and comfortable Was able to answer simple questions.  Objective: Vitals:   11/15/18 0435 11/15/18 0848 11/15/18 1024 11/15/18 1047  BP:      Pulse: 93  82   Resp:      Temp:      TempSrc:      SpO2:      Weight:    68.9 kg  Height:  5\' 11"  (1.803 m)      Intake/Output Summary (Last 24 hours) at 11/15/2018 1402 Last data filed at 11/15/2018 1100 Gross per 24 hour  Intake 2719.07 ml  Output 250 ml  Net 2469.07 ml   Filed Weights   11/15/18 1047  Weight: 68.9 kg    Examination:  General exam: Appears calm and comfortable  Respiratory system: Clear to auscultation. Respiratory effort normal. Cardiovascular system:  S1 & S2 heard, IRREGULAR.  Gastrointestinal system: Abdomen is nondistended, soft and nontender. Central nervous system: Alert and confused Extremities: Symmetric 5 x 5 power. Skin: No rashes, lesions or ulcers Psychiatry:  Mood & affect appropriate.     Data Reviewed: I have personally reviewed following labs and imaging studies  CBC: Recent Labs  Lab 11/14/18 1730 11/14/18 2240 11/15/18 0803 11/15/18 1056  WBC 12.5* 11.9* 12.0* 11.7*  NEUTROABS 11.2*  --   --   --   HGB 15.8 13.2 12.2* 12.6*  HCT 52.9* 44.6 41.5  42.6  MCV 103.1* 104.7* 107.0* 104.9*  PLT 308 211 205 202   Basic Metabolic Panel: Recent Labs  Lab 11/14/18 1730 11/15/18 0500  NA 159* 156*  K 3.6 3.6  CL 120* 126*  CO2 28 23  GLUCOSE 138* 147*  BUN 79* 70*  CREATININE 2.58* 2.11*  CALCIUM 9.3 8.0*   GFR: Estimated Creatinine Clearance: 22.7 mL/min (A) (by C-G formula based on SCr of 2.11 mg/dL (H)). Liver Function Tests: Recent Labs  Lab 11/14/18 1730  AST 53*  ALT 31  ALKPHOS 82  BILITOT 1.2  PROT 8.0  ALBUMIN 3.1*   No results for input(s): LIPASE, AMYLASE in the last 168 hours. No results for input(s): AMMONIA in the last 168 hours. Coagulation Profile: No results for input(s): INR, PROTIME in the last 168 hours. Cardiac Enzymes: No results for input(s): CKTOTAL, CKMB, CKMBINDEX, TROPONINI in the last 168 hours. BNP (last 3 results) No results for input(s): PROBNP in the last 8760 hours. HbA1C: No results for input(s): HGBA1C in the last 72 hours. CBG: No results for input(s): GLUCAP in the last 168 hours. Lipid Profile: No results for input(s): CHOL, HDL, LDLCALC, TRIG, CHOLHDL, LDLDIRECT in the last 72 hours. Thyroid Function Tests: No results for input(s): TSH, T4TOTAL, FREET4, T3FREE, THYROIDAB in the last 72 hours. Anemia Panel: No results for input(s): VITAMINB12, FOLATE, FERRITIN, TIBC, IRON, RETICCTPCT in the last 72 hours. Sepsis Labs: Recent Labs  Lab 11/14/18 1730 11/14/18 1904  LATICACIDVEN 2.3* 1.8    No results found for this or any previous visit (from the past 240 hour(s)).       Radiology Studies: Ct Head Wo Contrast  Result Date: 11/14/2018 CLINICAL DATA:  Altered level of consciousness. EXAM: CT HEAD WITHOUT CONTRAST TECHNIQUE: Contiguous axial images were obtained from the base of the skull through the vertex without intravenous contrast. COMPARISON:  09/04/2017 FINDINGS: Brain: There is no evidence for acute hemorrhage, hydrocephalus, mass lesion, or abnormal extra-axial  fluid collection. No definite CT evidence for acute infarction. Diffuse loss of parenchymal volume is consistent with atrophy. Patchy low attenuation in the deep hemispheric and periventricular white matter is nonspecific, but likely reflects chronic microvascular ischemic demyelination. Vascular: No hyperdense vessel or unexpected calcification. Skull: No evidence for fracture. No worrisome lytic or sclerotic lesion. Sinuses/Orbits: The visualized paranasal sinuses and right mastoid air cells are clear. Fluid noted in posterior left mastoid air cells. Visualized portions of the globes and intraorbital fat are unremarkable. Other: None. IMPRESSION: 1. No acute intracranial abnormality. 2. Atrophy with chronic small vessel white matter ischemic disease. Electronically Signed   By: Kennith CenterEric  Mansell M.D.   On: 11/14/2018 18:31   Dg Chest Portable 1 View  Result Date: 11/14/2018 CLINICAL DATA:  Dementia.  Decreased appetite. EXAM: PORTABLE CHEST 1 VIEW COMPARISON:  09/04/2017 FINDINGS: Right lung is clear. Patchy airspace disease noted left base. Interstitial markings are diffusely coarsened with chronic features. The cardio  pericardial silhouette is enlarged. Bones are diffusely demineralized. Face obscures the lung apices. IMPRESSION: Probable emphysema with left base patchy airspace disease suggesting pneumonia. Electronically Signed   By: Kennith Center M.D.   On: 11/14/2018 17:56        Scheduled Meds: . mouth rinse  15 mL Mouth Rinse BID   Continuous Infusions: . azithromycin Stopped (11/15/18 0109)  . cefTRIAXone (ROCEPHIN)  IV Stopped (11/15/18 1045)  . dextrose 75 mL/hr at 11/15/18 1153  . pantoprozole (PROTONIX) infusion 8 mg/hr (11/15/18 1152)     LOS: 0 days    Time spent: 65 MIN    Kathlen Mody, MD [facility] Pager [phone] If 7PM-7AM, please contact night-coverage [email or web address] Password [email or web address] 11/15/2018, 2:02 PM

## 2018-11-16 DIAGNOSIS — R195 Other fecal abnormalities: Secondary | ICD-10-CM

## 2018-11-16 LAB — BASIC METABOLIC PANEL
Anion gap: 5 (ref 5–15)
BUN: 57 mg/dL — ABNORMAL HIGH (ref 8–23)
CALCIUM: 8.1 mg/dL — AB (ref 8.9–10.3)
CO2: 26 mmol/L (ref 22–32)
Chloride: 122 mmol/L — ABNORMAL HIGH (ref 98–111)
Creatinine, Ser: 1.7 mg/dL — ABNORMAL HIGH (ref 0.61–1.24)
GFR calc Af Amer: 40 mL/min — ABNORMAL LOW (ref >60)
GFR calc non Af Amer: 35 mL/min — ABNORMAL LOW (ref >60)
GLUCOSE: 112 mg/dL — AB (ref 70–99)
Potassium: 3.3 mmol/L — ABNORMAL LOW (ref 3.5–5.1)
Sodium: 153 mmol/L — ABNORMAL HIGH (ref 135–145)

## 2018-11-16 LAB — CBC WITH DIFFERENTIAL/PLATELET
Abs Immature Granulocytes: 0.04 10*3/uL (ref 0.00–0.07)
BASOS PCT: 0 %
Basophils Absolute: 0 10*3/uL (ref 0.0–0.1)
EOS ABS: 0.1 10*3/uL (ref 0.0–0.5)
Eosinophils Relative: 1 %
HCT: 36.9 % — ABNORMAL LOW (ref 39.0–52.0)
Hemoglobin: 10.8 g/dL — ABNORMAL LOW (ref 13.0–17.0)
Immature Granulocytes: 0 %
Lymphocytes Relative: 10 %
Lymphs Abs: 0.9 10*3/uL (ref 0.7–4.0)
MCH: 31.6 pg (ref 26.0–34.0)
MCHC: 29.3 g/dL — ABNORMAL LOW (ref 30.0–36.0)
MCV: 107.9 fL — ABNORMAL HIGH (ref 80.0–100.0)
MONOS PCT: 4 %
Monocytes Absolute: 0.4 10*3/uL (ref 0.1–1.0)
NEUTROS PCT: 85 %
Neutro Abs: 7.6 10*3/uL (ref 1.7–7.7)
Platelets: 167 10*3/uL (ref 150–400)
RBC: 3.42 MIL/uL — ABNORMAL LOW (ref 4.22–5.81)
RDW: 13.5 % (ref 11.5–15.5)
WBC: 8.9 10*3/uL (ref 4.0–10.5)
nRBC: 0 % (ref 0.0–0.2)

## 2018-11-16 LAB — URINE CULTURE: Culture: NO GROWTH

## 2018-11-16 LAB — FOLATE: FOLATE: 10.4 ng/mL (ref >5.9)

## 2018-11-16 MED ORDER — POTASSIUM CHLORIDE CRYS ER 20 MEQ PO TBCR
40.0000 meq | EXTENDED_RELEASE_TABLET | Freq: Once | ORAL | Status: AC
  Administered 2018-11-16: 40 meq via ORAL
  Filled 2018-11-16: qty 2

## 2018-11-16 MED ORDER — LACTATED RINGERS IV BOLUS
500.0000 mL | Freq: Once | INTRAVENOUS | Status: AC
  Administered 2018-11-16: 500 mL via INTRAVENOUS

## 2018-11-16 MED ORDER — PANTOPRAZOLE SODIUM 40 MG IV SOLR
40.0000 mg | Freq: Two times a day (BID) | INTRAVENOUS | Status: DC
  Administered 2018-11-16 – 2018-11-20 (×10): 40 mg via INTRAVENOUS
  Filled 2018-11-16 (×10): qty 40

## 2018-11-16 MED ORDER — HALOPERIDOL LACTATE 5 MG/ML IJ SOLN
2.5000 mg | Freq: Once | INTRAMUSCULAR | Status: AC
  Administered 2018-11-16: 2.5 mg via INTRAVENOUS
  Filled 2018-11-16: qty 1

## 2018-11-16 MED ORDER — NYSTATIN 100000 UNIT/GM EX POWD
Freq: Three times a day (TID) | CUTANEOUS | Status: DC
  Administered 2018-11-16 – 2018-11-22 (×18): via TOPICAL
  Filled 2018-11-16: qty 15

## 2018-11-16 NOTE — Progress Notes (Signed)
PROGRESS NOTE    Tony Reed  WUJ:[id number] DOB: May 23, 1926 DOA: 11/14/2018 PCP: Joaquim Namuncan, Graham S, MD    Brief Narrative: Tony Reed is a 85 y.o. male with history of CAD status post PCI, diastolic CHF, atrial fibrillation, dementia, chronic kidney disease, chronic anemia was brought to the ER after patient was found to have increasing confusion.  As per the family patient has not been eating well for the last 5 to 6 days.  Patient has become more confused and was taken to the primary care office and was referred to the ER for further work-up. Over the last few days patient also has noticed to have some dark stools.  Does not take any NSAIDs but patient is on Xarelto for A. Fib.   In the ER patient has periods of confusion.  Able to move all extremities follows commands.  CT head was unremarkable.  Chest x-ray shows possibility of pneumonia.  UA is unremarkable.  Patient was afebrile.  Stool for occult blood was positive.  Hemoglobin is around 15.  Creatinine has increased from 1.25 two months ago to 2.5.  Sodium is 159 WBC count is 12.5.  Patient was given fluid bolus due to dehydration and started on D5W for hypernatremia with renal failure.  Protonix started for GI bleed.  Patient admitted for acute encephalopathy with acute renal failure hyponatremia GI bleed and pneumonia.   Assessment & Plan:   Principal Problem:   ARF (acute renal failure) (HCC) Active Problems:   Atrial fibrillation (HCC)   Chronic heart failure with preserved ejection fraction (HCC)   Hypernatremia   Pneumonia   Acute GI bleeding   Malnutrition of moderate degree   Acute on chronic kidney disease stage III with hypernatremia probably secondary to dehydration and poor oral intake. Sodium has improved from 159-156 to 153.  And creatinine has improved from 2.58 to 1.7.  Continue with IV fluids at this time. Lactic acid within normal limits. Urine analysis is negative for infection.   Chronic  Atrial fibrillation, Rate controlled Xarelto on hold for possible GI bleed.   Guaiac positive stools possibly blood loss from GI tract Holding Xarelto for now. Gi consulted for EGD.  H&H dropped to 10 today.  Continue to monitor.   Hypokalemia replaced.    Acute metabolic encephalopathy probably secondary to hypernatremia and AKI Improving, daughter feels patient is almost back to baseline.  Continue to monitor.   History of chronic diastolic heart failure Patient appears to be dehydrated will need fluids.    DVT prophylaxis: SCDs Code Status: DNR Family Communication: None at bedside, discussed with daughter over the phone.  Disposition Plan: Pending clinical improvement  Consultants:   Palliative care consult  Gastroenterology consult.   Procedures: None Antimicrobials: None  Subjective: Calm and comfortable Was able to answer simple questions. No chest pain or sob.   Objective: Vitals:   11/15/18 1415 11/15/18 2113 11/16/18 0628 11/16/18 0914  BP: 116/76 102/66 112/71 100/72  Pulse: 83 87 82 78  Resp: (!) 21 20 18    Temp: 97.8 F (36.6 C) 98.1 F (36.7 C) 98.4 F (36.9 C) (!) 96.9 F (36.1 C)  TempSrc: Oral Oral Oral Axillary  SpO2: 100% 100% 98% 99%  Weight:      Height:        Intake/Output Summary (Last 24 hours) at 11/16/2018 1322 Last data filed at 11/16/2018 1104 Gross per 24 hour  Intake 2920.36 ml  Output 900 ml  Net 2020.36 ml  Filed Weights   11/15/18 1047  Weight: 68.9 kg    Examination:  General exam: Appears calm and comfortable , not in distress.  Respiratory system: Clear to auscultation. Respiratory effort normal. No wheezing or rhonchi.  Cardiovascular system: S1 & S2 heard, IRREGULAR.  Gastrointestinal system: Abdomen is nondistended, soft and nontender. Central nervous system: Alert and confused Extremities: Symmetric 5 x 5 power. Skin: No rashes, lesions or ulcers Psychiatry:  Mood & affect appropriate.     Data  Reviewed: I have personally reviewed following labs and imaging studies  CBC: Recent Labs  Lab 11/14/18 1730 11/14/18 2240 11/15/18 0803 11/15/18 1056 11/16/18 0439  WBC 12.5* 11.9* 12.0* 11.7* 8.9  NEUTROABS 11.2*  --   --   --  7.6  HGB 15.8 13.2 12.2* 12.6* 10.8*  HCT 52.9* 44.6 41.5 42.6 36.9*  MCV 103.1* 104.7* 107.0* 104.9* 107.9*  PLT 308 211 205 202 167   Basic Metabolic Panel: Recent Labs  Lab 11/14/18 1730 11/15/18 0500 11/16/18 0439  NA 159* 156* 153*  K 3.6 3.6 3.3*  CL 120* 126* 122*  CO2 28 23 26   GLUCOSE 138* 147* 112*  BUN 79* 70* 57*  CREATININE 2.58* 2.11* 1.70*  CALCIUM 9.3 8.0* 8.1*   GFR: Estimated Creatinine Clearance: 28.1 mL/min (A) (by C-G formula based on SCr of 1.7 mg/dL (H)). Liver Function Tests: Recent Labs  Lab 11/14/18 1730  AST 53*  ALT 31  ALKPHOS 82  BILITOT 1.2  PROT 8.0  ALBUMIN 3.1*   No results for input(s): LIPASE, AMYLASE in the last 168 hours. No results for input(s): AMMONIA in the last 168 hours. Coagulation Profile: No results for input(s): INR, PROTIME in the last 168 hours. Cardiac Enzymes: No results for input(s): CKTOTAL, CKMB, CKMBINDEX, TROPONINI in the last 168 hours. BNP (last 3 results) No results for input(s): PROBNP in the last 8760 hours. HbA1C: No results for input(s): HGBA1C in the last 72 hours. CBG: No results for input(s): GLUCAP in the last 168 hours. Lipid Profile: No results for input(s): CHOL, HDL, LDLCALC, TRIG, CHOLHDL, LDLDIRECT in the last 72 hours. Thyroid Function Tests: No results for input(s): TSH, T4TOTAL, FREET4, T3FREE, THYROIDAB in the last 72 hours. Anemia Panel: Recent Labs    11/16/18 0439  FOLATE 10.4   Sepsis Labs: Recent Labs  Lab 11/14/18 1730 11/14/18 1904  LATICACIDVEN 2.3* 1.8    Recent Results (from the past 240 hour(s))  Urine culture     Status: None   Collection Time: 11/14/18  6:01 PM  Result Value Ref Range Status   Specimen Description   Final      URINE, CATHETERIZED Performed at [facility], [address] [place], [place] [address]    Special Requests   Final    NONE Performed at [facility], [address] [place], [place] [address]    Culture   Final    NO GROWTH Performed at [facility], [address] [place], [place] [address]    Report Status 11/16/2018 FINAL  Final  Blood culture (routine x 2)     Status: None (Preliminary result)   Collection Time: 11/14/18  8:14 PM  Result Value Ref Range Status   Specimen Description   Final    BLOOD RIGHT HAND Performed at [facility], [address] [place], [place] [address]    Special Requests   Final    BOTTLES DRAWN AEROBIC AND ANAEROBIC Blood Culture  results may not be optimal due to an inadequate volume of blood received in culture bottles Performed at [facility], [address], [place], [place] [address]    Culture   Final    NO GROWTH 1 DAY Performed at [facility], [address], [place], [place] [address]    Report Status PENDING  Incomplete  Blood culture (routine x 2)     Status: None (Preliminary result)   Collection Time: 11/14/18  8:40 PM  Result Value Ref Range Status   Specimen Description   Final    BLOOD LEFT WRIST Performed at [facility], [address] [place], [place] [address]    Special Requests   Final    BOTTLES DRAWN AEROBIC AND ANAEROBIC Blood Culture adequate volume Performed at [facility], [address], [place], [place] [address]    Culture   Final    NO GROWTH 1 DAY Performed at [facility], [address], [place], [place] [address]    Report Status PENDING  Incomplete         Radiology Studies: Ct Head Wo Contrast  Result Date: 11/14/2018 CLINICAL DATA:  Altered level of consciousness. EXAM: CT HEAD WITHOUT CONTRAST TECHNIQUE: Contiguous axial images were obtained from the  base of the skull through the vertex without intravenous contrast. COMPARISON:  09/04/2017 FINDINGS: Brain: There is no evidence for acute hemorrhage, hydrocephalus, mass lesion, or abnormal extra-axial fluid collection. No definite CT evidence for acute infarction. Diffuse loss of parenchymal volume is consistent with atrophy. Patchy low attenuation in the deep hemispheric and periventricular white matter is nonspecific, but likely reflects chronic microvascular ischemic demyelination. Vascular: No hyperdense vessel or unexpected calcification. Skull: No evidence for fracture. No worrisome lytic or sclerotic lesion. Sinuses/Orbits: The visualized paranasal sinuses and right mastoid air cells are clear. Fluid noted in posterior left mastoid air cells. Visualized portions of the globes and intraorbital fat are unremarkable. Other: None. IMPRESSION: 1. No acute intracranial abnormality. 2. Atrophy with chronic small vessel white matter ischemic disease. Electronically Signed   By: Kennith Center M.D.   On: 11/14/2018 18:31   Dg Chest Portable 1 View  Result Date: 11/14/2018 CLINICAL DATA:  Dementia.  Decreased appetite. EXAM: PORTABLE CHEST 1 VIEW COMPARISON:  09/04/2017 FINDINGS: Right lung is clear. Patchy airspace disease noted left base. Interstitial markings are diffusely coarsened with chronic features. The cardio pericardial silhouette is enlarged. Bones are diffusely demineralized. Face obscures the lung apices. IMPRESSION: Probable emphysema with left base patchy airspace disease suggesting pneumonia. Electronically Signed   By: Kennith Center M.D.   On: 11/14/2018 17:56        Scheduled Meds: . feeding supplement (ENSURE ENLIVE)  237 mL Oral BID BM  . mouth rinse  15 mL Mouth Rinse BID  . multivitamin with minerals  1 tablet Oral Daily  . nystatin   Topical TID  . pantoprazole (PROTONIX) IV  40 mg Intravenous Q12H   Continuous Infusions: . azithromycin Stopped (11/15/18 2224)  . cefTRIAXone  (ROCEPHIN)  IV 1 g (11/16/18 0931)     LOS: 1 day    Time spent: 37 MIN    Kathlen Mody, MD [facility] Pager [phone] If 7PM-7AM, please contact night-coverage [email or web address] Password [email or web address] 11/16/2018, 1:22 PM

## 2018-11-16 NOTE — Consult Note (Signed)
Referring Provider: [facility]  Primary Care Physician:  Joaquim Namuncan, Graham S, MD Primary Gastroenterologist: Previously Dr. Juanda ChanceBrodie     Reason for Consultation:   GI bleed    ASSESSMENT / PLAN:    1. 85 yo male with multiple medical problems admitted with FTT, AKI on CKD with electrolyte disturbances ( Na+ of 159) -Na+ slowly improving -Cr down from 2.58 to 1.7  2. Macrocytic anemia / heme positive stools on Xarelto. Stools dark (at home) on iron.  Mild drop in hgb from baseline of 11-12 range to 10.8 after IV hydration. The hgb of 15.8 in ED was likely high from volume depletion.  -Patient has dementia, he cannot provide history. -monitor for overt GI bleeding / progressive decline in hgb. If evidence for significant bleeding then we will need to get with family to see how aggressive they won't to be in pursuing workup. -given macrocytosis will check B12, folate  -not unreasonable to give PPI for now.   3. PNA, on antibiotics.  4. Mental status changes, likely multifactorial ( dementia, electrolyte abnormalities, acute illness).   HPI:      HPI: Tony Reed is a 85 y.o. male with multiple medical problems not limited to dementia, PAF, CHF, CAD, CKD, hypertension, GERD , remote adenomatous colon polyps.  Last colonoscopy 10 years ago revealed diverticulosis.  No polyps at that time.  Patient sent by PCP to ED 2 days ago for failure to thrive and dark stools at home per family. Based on home med list he takes iron. Patient cannot provide history, he is confused.   ED evaluation:  WBC 11.9 hgb 15.8, MCV 103, platelets 308 Sodium 159, chloride 120 BUN 79, creatinine 2.58, albumin 4.3 INR 1.21 Head CT scan for altered mental status-no acute changes    Past Medical History:  Diagnosis Date  . CAD (coronary artery disease)    inferior MI in 2003 w BMS to RCA. DES to CFX in 2008.  Normal Myoviews in 5/13.  Marland Kitchen. CHF (congestive heart failure) (HCC)    diastolic: Echo  (8/294/11) w EF 50-60%, moderate MR, moderate to sever LAE, severe RAE, moderate to sever TR  . CKD (chronic kidney disease)   . Diverticulitis, colon   . GERD (gastroesophageal reflux disease)   . Hyperlipidemia   . Hypertension   . Leg edema    L>R chronically  . Leg pain    and decrease pulses consistent w peripheral vasc disease  . Memory difficulty   . Osteoporosis    s/p 5+ years of fosamax treatment as of 2013  . Paroxysmal atrial fibrillation (HCC)    w history of DCCV  . UTI (urinary tract infection)    with altered mentation 2018    Past Surgical History:  Procedure Laterality Date  . adenosine cardiolite  10/03/02   old infarct inf o/w negative EF 61% 10/03/02  . CARDIAC CATHETERIZATION  1986   normal  . CARDIOVERSION  02/03/10   successful on Mutaq (Dr. Juanda ChanceBrodie)   . Cath  06/25/09   nonobstructive disease afib (Dr. Juanda ChanceBrodie)  . Cath, stents  09/27/02  . choleycystectomy  1996  . COLONOSCOPY  12/13/08   mod divertics no polyps (Dr Juanda ChanceBrodie)  . CT  11/21/03   abd mod H. H., CT pelvis elevated prostate  . CYSTOSCOPY  09/26/03   Vonita MossPeterson  . Echo multiple findings  01/18/10  . ESOPHAGOGASTRODUODENOSCOPY  12/05/03   postiive H-Pylori, H.H., stricture   . FLEXIBLE SIGMOIDOSCOPY  1/98  . hemmoroidectoy    . herniorraphy     x2  . Hosp[facility] R/O  01/06/04  . poypectomy    . stent in distal circum  04/17/02    Prior to Admission medications   Medication Sig Start Date End Date Taking? Authorizing Provider  atorvastatin (LIPITOR) 80 MG tablet TAKE 1 TABLET BY MOUTH EVERY DAY 10/04/18  Yes End, Cristal Deer, MD  ferrous sulfate 325 (65 FE) MG tablet Take 325 mg by mouth daily with breakfast.     Yes [provider]  furosemide (LASIX) 40 MG tablet TAKE 1 TABLET(40 MG) BY MOUTH DAILY 10/19/18  Yes Laurey Morale, MD  potassium chloride SA (K-DUR,KLOR-CON) 20 MEQ tablet Take 1 tablet (20 mEq total) by mouth daily. 01/25/18  Yes End, Cristal Deer, MD  ramipril (ALTACE) 2.5 MG  capsule TAKE 1 CAPSULE(2.5 MG) BY MOUTH DAILY 01/27/18  Yes End, Cristal Deer, MD  vitamin B-12 (CYANOCOBALAMIN) 1000 MCG tablet Take 1 tablet (1,000 mcg total) by mouth daily. 05/20/16  Yes Joaquim Nam, MD  XARELTO 15 MG TABS tablet TAKE 1 TABLET(15 MG) BY MOUTH DAILY WITH SUPPER 07/12/18  Yes End, Cristal Deer, MD    Current Facility-Administered Medications  Medication Dose Route Frequency Provider Last Rate Last Dose  . acetaminophen (TYLENOL) tablet 650 mg  650 mg Oral Q6H PRN Eduard Clos, MD       Or  . acetaminophen (TYLENOL) suppository 650 mg  650 mg Rectal Q6H PRN Eduard Clos, MD      . azithromycin (ZITHROMAX) 500 mg in sodium chloride 0.9 % 250 mL IVPB  500 mg Intravenous QHS Eduard Clos, MD   Stopped at 11/15/18 2224  . cefTRIAXone (ROCEPHIN) 1 g in sodium chloride 0.9 % 100 mL IVPB  1 g Intravenous Q24H Eduard Clos, MD 200 mL/hr at 11/16/18 0931 1 g at 11/16/18 0931  . feeding supplement (ENSURE ENLIVE) (ENSURE ENLIVE) liquid 237 mL  237 mL Oral BID BM Kathlen Mody, MD   237 mL at 11/16/18 0920  . MEDLINE mouth rinse  15 mL Mouth Rinse BID Eduard Clos, MD   15 mL at 11/16/18 0931  . multivitamin with minerals tablet 1 tablet  1 tablet Oral Daily Kathlen Mody, MD   1 tablet at 11/16/18 0919  . nystatin (MYCOSTATIN/NYSTOP) topical powder   Topical TID Kathlen Mody, MD      . pantoprazole (PROTONIX) 80 mg in sodium chloride 0.9 % 250 mL (0.32 mg/mL) infusion  8 mg/hr Intravenous Continuous Eduard Clos, MD 25 mL/hr at 11/16/18 0919 8 mg/hr at 11/16/18 0919    Allergies as of 11/14/2018 - Review Complete 11/14/2018  Allergen Reaction Noted  . Namenda [memantine hcl] Other (See Comments) 06/01/2017  . Naproxen Other (See Comments) 12/30/2006  . Penicillins Other (See Comments)     Family History  Problem Relation Age of Onset  . Hypertension Mother   . Leukemia Mother   . Lung cancer Brother        smoking  . Heart  Problems Brother        smoker  . Deafness Brother     Social History   Socioeconomic History  . Marital status: Married    Spouse name: Not on file  . Number of children: 6  . Years of education: Not on file  . Highest education level: Not on file  Occupational History    Employer: RETIRED  Social Needs  . Financial resource strain:  Not on file  . Food insecurity:    Worry: Not on file    Inability: Not on file  . Transportation needs:    Medical: Not on file    Non-medical: Not on file  Tobacco Use  . Smoking status: Never Smoker  . Smokeless tobacco: Never Used  Substance and Sexual Activity  . Alcohol use: No  . Drug use: No  . Sexual activity: Yes  Lifestyle  . Physical activity:    Days per week: Not on file    Minutes per session: Not on file  . Stress: Not on file  Relationships  . Social connections:    Talks on phone: Not on file    Gets together: Not on file    Attends religious service: Not on file    Active member of club or organization: Not on file    Attends meetings of clubs or organizations: Not on file    Relationship status: Not on file  . Intimate partner violence:    Fear of current or ex partner: Not on file    Emotionally abused: Not on file    Physically abused: Not on file    Forced sexual activity: Not on file  Other Topics Concern  . Not on file  Social History Narrative   Retired. Lives w/son in [place].    Wife died 2017/02/27- due to dementia, patient may not recall that his wife has died    Review of Systems: All systems reviewed and negative except where noted in HPI.  Physical Exam: Vital signs in last 24 hours: Temp:  [96.9 F (36.1 C)-98.4 F (36.9 C)] 96.9 F (36.1 C) (01/30 0914) Pulse Rate:  [78-87] 78 (01/30 0914) Resp:  [18-21] 18 (01/30 0628) BP: (100-116)/(66-76) 100/72 (01/30 0914) SpO2:  [98 %-100 %] 99 % (01/30 0914) Last BM Date: 11/16/18 General:   Drowsy white elderly male in NAD Psych:  cooperative.  . Eyes:  Pupils equal, sclera clear, no icterus.   Conjunctiva pale. Ears:  Decreased auditory acuity. Nose:  No deformity, discharge,  or lesions. Neck:  Supple; no masses Lungs:  Clear throughout to auscultation.   No wheezes, crackles, or rhonchi.  Heart:   Regular rate, irreg rhythm, no lower extremity edema Abdomen:  Soft, non-distended, nontender, BS active, no palp mass    Rectal:  Deferred  Msk:  Symmetrical without gross deformities. . Neurologic:  Sleepy. Doesn't answer questions on demand. Confused. Skin:  Intact without significant lesions or rashes.   Intake/Output from previous day: 01/29 0701 - 01/30 0700 In: 3153.7 [P.O.:677; I.V.:1681.6; IV Piggyback:795.1] Out: 575 [Urine:575] Intake/Output this shift: Total I/O In: 514 [P.O.:357; I.V.:157] Out: 325 [Urine:325]  Lab Results: Recent Labs    11/15/18 0803 11/15/18 1056 11/16/18 0439  WBC 12.0* 11.7* 8.9  HGB 12.2* 12.6* 10.8*  HCT 41.5 42.6 36.9*  PLT 205 202 167   BMET Recent Labs    11/14/18 1730 11/15/18 0500 11/16/18 0439  NA 159* 156* 153*  K 3.6 3.6 3.3*  CL 120* 126* 122*  CO2 28 23 26   GLUCOSE 138* 147* 112*  BUN 79* 70* 57*  CREATININE 2.58* 2.11* 1.70*  CALCIUM 9.3 8.0* 8.1*   LFT Recent Labs    11/14/18 1730  PROT 8.0  ALBUMIN 3.1*  AST 53*  ALT 31  ALKPHOS 82  BILITOT 1.2      Studies/Results: Ct Head Wo Contrast  Result Date: 11/14/2018 CLINICAL DATA:  Altered level of consciousness. EXAM: CT  HEAD WITHOUT CONTRAST TECHNIQUE: Contiguous axial images were obtained from the base of the skull through the vertex without intravenous contrast. COMPARISON:  09/04/2017 FINDINGS: Brain: There is no evidence for acute hemorrhage, hydrocephalus, mass lesion, or abnormal extra-axial fluid collection. No definite CT evidence for acute infarction. Diffuse loss of parenchymal volume is consistent with atrophy. Patchy low attenuation in the deep hemispheric and periventricular white matter  is nonspecific, but likely reflects chronic microvascular ischemic demyelination. Vascular: No hyperdense vessel or unexpected calcification. Skull: No evidence for fracture. No worrisome lytic or sclerotic lesion. Sinuses/Orbits: The visualized paranasal sinuses and right mastoid air cells are clear. Fluid noted in posterior left mastoid air cells. Visualized portions of the globes and intraorbital fat are unremarkable. Other: None. IMPRESSION: 1. No acute intracranial abnormality. 2. Atrophy with chronic small vessel white matter ischemic disease. Electronically Signed   By: Kennith Center M.D.   On: 11/14/2018 18:31   Dg Chest Portable 1 View  Result Date: 11/14/2018 CLINICAL DATA:  Dementia.  Decreased appetite. EXAM: PORTABLE CHEST 1 VIEW COMPARISON:  09/04/2017 FINDINGS: Right lung is clear. Patchy airspace disease noted left base. Interstitial markings are diffusely coarsened with chronic features. The cardio pericardial silhouette is enlarged. Bones are diffusely demineralized. Face obscures the lung apices. IMPRESSION: Probable emphysema with left base patchy airspace disease suggesting pneumonia. Electronically Signed   By: Kennith Center M.D.   On: 11/14/2018 17:56     Willette Cluster, NP-C @  11/16/2018, 11:14 AM

## 2018-11-16 NOTE — Evaluation (Signed)
Physical Therapy Evaluation Patient Details Name: Tony Reed MRN: [id number] DOB: 1926/01/05 Today's Date: 11/16/2018   History of Present Illness  85 yo male admitted with GI bleed, confusion, ARF. Hx of dementia, A fib, CAD, CHF  Clinical Impression  On eval, pt required Mod assist for mobility. He was able to stand and pivot x 2, bed<>bsc, with RW. He participated well. Pt remained on [place] O2 during session. No family present during session. Recommend SNF for rehab unless family plans to take pt back home with 24 hour supervision/assist.     Follow Up Recommendations SNF;Supervision/Assistance - 24 hour(unless family decides to take pt back home)    Equipment Recommendations  Rolling walker with 5" wheels;3in1 (PT)(if pt doesn't already have DME)    Recommendations for Other Services       Precautions / Restrictions Precautions Precautions: Fall Restrictions Weight Bearing Restrictions: No      Mobility  Bed Mobility Overal bed mobility: Needs Assistance Bed Mobility: Supine to Sit;Sit to Supine     Supine to sit: Min assist;HOB elevated Sit to supine: Mod assist;HOB elevated   General bed mobility comments: Assist for trunk and LEs. Increased time. Multimodal cueing required.   Transfers Overall transfer level: Needs assistance Equipment used: Rolling walker (2 wheeled) Transfers: Sit to/from [employer] CorporationStand;Stand Pivot Transfers Sit to Stand: From elevated surface;Mod assist Stand pivot transfers: Min assist       General transfer comment: Assist to rise, stabilize, control descent. Multimodal cueing for safety, technique, hand placement. Stand pivot x 2, bed<>bsc, with RW.   Ambulation/Gait Ambulation/Gait assistance: Min assist   Assistive device: Rolling walker (2 wheeled)       General Gait Details: Side steps x 4 along side of bed with RW. Multimodal cueing required. Assist to stabilize pt and maneuver with RW.   Stairs            Wheelchair  Mobility    Modified Rankin (Stroke Patients Only)       Balance Overall balance assessment: Needs assistance           Standing balance-Leahy Scale: Poor                               Pertinent Vitals/Pain Pain Assessment: No/denies pain    Home Living Family/patient expects to be discharged to:: Unsure Living Arrangements: Children                    Prior Function           Comments: unsure of PLOF. No family present.     Hand Dominance        Extremity/Trunk Assessment   Upper Extremity Assessment Upper Extremity Assessment: Generalized weakness    Lower Extremity Assessment Lower Extremity Assessment: Generalized weakness    Cervical / Trunk Assessment Cervical / Trunk Assessment: Normal  Communication   Communication: HOH  Cognition Arousal/Alertness: Awake/alert Behavior During Therapy: WFL for tasks assessed/performed Overall Cognitive Status: History of cognitive impairments - at baseline                                        General Comments      Exercises     Assessment/Plan    PT Assessment Patient needs continued PT services  PT Problem List Decreased strength;Decreased cognition;Decreased balance;Decreased mobility;Decreased activity tolerance;Decreased  knowledge of use of DME       PT Treatment Interventions DME instruction;Gait training;Functional mobility training;Therapeutic activities;Balance training;Patient/family education;Therapeutic exercise    PT Goals (Current goals can be found in the Care Plan section)  Acute Rehab PT Goals Patient Stated Goal: none stated. no family present.  PT Goal Formulation: Patient unable to participate in goal setting Time For Goal Achievement: 11/30/18 Potential to Achieve Goals: Fair    Frequency Min 2X/week   Barriers to discharge        Co-evaluation               AM-PAC PT "6 Clicks" Mobility  Outcome Measure Help needed turning  from your back to your side while in a flat bed without using bedrails?: A Lot Help needed moving from lying on your back to sitting on the side of a flat bed without using bedrails?: A Lot Help needed moving to and from a bed to a chair (including a wheelchair)?: A Lot Help needed standing up from a chair using your arms (e.g., wheelchair or bedside chair)?: A Lot Help needed to walk in hospital room?: A Lot Help needed climbing 3-5 steps with a railing? : Total 6 Click Score: 11    End of Session Equipment Utilized During Treatment: Gait belt;Oxygen Activity Tolerance: Patient tolerated treatment well Patient left: in bed;with call bell/phone within reach;with bed alarm set   PT Visit Diagnosis: Muscle weakness (generalized) (M62.81);Difficulty in walking, not elsewhere classified (R26.2);Unsteadiness on feet (R26.81)    Time: 5697-9480 PT Time Calculation (min) (ACUTE ONLY): 20 min   Charges:   PT Evaluation $PT Eval Moderate Complexity: 1 Mod           Rebeca Alert, PT Acute Rehabilitation Services Pager: [phone] Office: [phone]

## 2018-11-17 DIAGNOSIS — F0390 Unspecified dementia without behavioral disturbance: Secondary | ICD-10-CM

## 2018-11-17 DIAGNOSIS — R195 Other fecal abnormalities: Secondary | ICD-10-CM

## 2018-11-17 LAB — CBC WITH DIFFERENTIAL/PLATELET
Abs Immature Granulocytes: 0.05 10*3/uL (ref 0.00–0.07)
Basophils Absolute: 0 10*3/uL (ref 0.0–0.1)
Basophils Relative: 0 %
Eosinophils Absolute: 0.1 10*3/uL (ref 0.0–0.5)
Eosinophils Relative: 1 %
HCT: 42.2 % (ref 39.0–52.0)
Hemoglobin: 12.7 g/dL — ABNORMAL LOW (ref 13.0–17.0)
IMMATURE GRANULOCYTES: 1 %
Lymphocytes Relative: 10 %
Lymphs Abs: 0.8 10*3/uL (ref 0.7–4.0)
MCH: 31.6 pg (ref 26.0–34.0)
MCHC: 30.1 g/dL (ref 30.0–36.0)
MCV: 105 fL — AB (ref 80.0–100.0)
Monocytes Absolute: 0.4 10*3/uL (ref 0.1–1.0)
Monocytes Relative: 5 %
Neutro Abs: 6.6 10*3/uL (ref 1.7–7.7)
Neutrophils Relative %: 83 %
Platelets: 163 10*3/uL (ref 150–400)
RBC: 4.02 MIL/uL — ABNORMAL LOW (ref 4.22–5.81)
RDW: 13.3 % (ref 11.5–15.5)
WBC: 7.9 10*3/uL (ref 4.0–10.5)
nRBC: 0 % (ref 0.0–0.2)

## 2018-11-17 LAB — BASIC METABOLIC PANEL
Anion gap: 4 — ABNORMAL LOW (ref 5–15)
BUN: 39 mg/dL — ABNORMAL HIGH (ref 8–23)
CO2: 25 mmol/L (ref 22–32)
Calcium: 8.7 mg/dL — ABNORMAL LOW (ref 8.9–10.3)
Chloride: 123 mmol/L — ABNORMAL HIGH (ref 98–111)
Creatinine, Ser: 1.46 mg/dL — ABNORMAL HIGH (ref 0.61–1.24)
GFR calc Af Amer: 48 mL/min — ABNORMAL LOW (ref >60)
GFR calc non Af Amer: 42 mL/min — ABNORMAL LOW (ref >60)
Glucose, Bld: 108 mg/dL — ABNORMAL HIGH (ref 70–99)
Potassium: 3.8 mmol/L (ref 3.5–5.1)
Sodium: 152 mmol/L — ABNORMAL HIGH (ref 135–145)

## 2018-11-17 LAB — VITAMIN B12: Vitamin B-12: 1761 pg/mL — ABNORMAL HIGH (ref 180–914)

## 2018-11-17 MED ORDER — LORAZEPAM 2 MG/ML IJ SOLN
1.0000 mg | Freq: Once | INTRAMUSCULAR | Status: AC
  Administered 2018-11-17: 1 mg via INTRAVENOUS
  Filled 2018-11-17: qty 1

## 2018-11-17 MED ORDER — AZITHROMYCIN 250 MG PO TABS
500.0000 mg | ORAL_TABLET | Freq: Every day | ORAL | Status: AC
  Administered 2018-11-17 – 2018-11-18 (×2): 500 mg via ORAL
  Filled 2018-11-17 (×2): qty 2

## 2018-11-17 MED ORDER — DEXTROSE 5 % IV SOLN
INTRAVENOUS | Status: DC
  Administered 2018-11-17 – 2018-11-18 (×2): via INTRAVENOUS

## 2018-11-17 NOTE — Progress Notes (Signed)
Pharmacy IV to PO conversion  This patient is receiving Azithromycin by the intravenous route. Based on criteria approved by the Pharmacy and Therapeutics Committee, and the Infectious Disease Division, the antibiotic(s) is/are being converted to equivalent oral dose form(s). These criteria include:   Patient being treated for a respiratory tract infection, urinary tract infection, cellulitis, or Clostridium Difficile Associated Diarrhea  The patient is not neutropenic and does not exhibit a GI malabsorption state  The patient is eating (either orally or per tube) and/or has been taking other orally administered medications for at least 24 hours.  The patient is improving clinically (physician assessment and a 24-hour Tmax of <=100.5 F)  If you have any questions about this conversion, please contact the Pharmacy Department (ext [phone]).  Thank you.  Bernadene Person, PharmD, BCPS [phone] 11/17/2018, 12:36 PM

## 2018-11-17 NOTE — Progress Notes (Signed)
PROGRESS NOTE    Tony Stackdward L Baze  ZOX:[id number] DOB: 22-Jul-1926 DOA: 11/14/2018 PCP: Joaquim Namuncan, Graham S, MD    Brief Narrative: Tony Reed is a 85 y.o. male with history of CAD status post PCI, diastolic CHF, atrial fibrillation, dementia, chronic kidney disease, chronic anemia was brought to the ER after patient was found to have increasing confusion.  As per the family patient has not been eating well for the last 5 to 6 days.  Patient has become more confused and was taken to the primary care office and was referred to the ER for further work-up. Over the last few days patient also has noticed to have some dark stools.  Does not take any NSAIDs but patient is on Xarelto for A. Fib.   In the ER patient has periods of confusion.  Able to move all extremities follows commands.  CT head was unremarkable.  Chest x-ray shows possibility of pneumonia.  UA is unremarkable.  Patient was afebrile.  Stool for occult blood was positive.  Hemoglobin is around 15.  Creatinine has increased from 1.25 two months ago to 2.5.  Sodium is 159 WBC count is 12.5.  Patient was given fluid bolus due to dehydration and started on D5W for hypernatremia with renal failure.  Protonix started for GI bleed.  Patient admitted for acute encephalopathy with acute renal failure hyponatremia GI bleed and pneumonia.   Assessment & Plan:   Principal Problem:   ARF (acute renal failure) (HCC) Active Problems:   Atrial fibrillation (HCC)   Chronic heart failure with preserved ejection fraction (HCC)   Hypernatremia   Pneumonia   Acute GI bleeding   Malnutrition of moderate degree   Acute on chronic kidney disease stage III ,with hypernatremia probably secondary to dehydration and poor oral intake. Sodium has improved from 159-156 to 153 to 152.  And creatinine has improved from 2.58 to 1.7 to 1.4.  Continue with IV fluids at this time. Lactic acid within normal limits. Urine analysis is negative for  infection.   Chronic Atrial fibrillation, Rate controlled Xarelto on hold for possible GI bleed. Discussed with daughter, would like to hold the xarelto until Gi work up.    Guaiac positive stools possibly blood loss from GI tract Holding Xarelto for now. GI consulted for EGD.  H&H stable. Resume PPI. Continue to monitor.   Hypokalemia replaced.    Acute metabolic encephalopathy probably secondary to hypernatremia and AKI Improving, daughter feels patient is almost back to baseline.  Continue to monitor.   History of chronic diastolic heart failure Appears euvolemic.   Community acquired pneumonia:  Resume rocephin and zithromax and plan to transition to Augmentin on discharge.   DVT prophylaxis: SCDs Code Status: DNR Family Communication: discussed with daughter at bedside.  Disposition Plan: Pending clinical improvement  Consultants:   Palliative care consult  Gastroenterology consult.   Procedures: None Antimicrobials: rocephin and zithromax since admission.  Subjective: slightly confused,but able to communicate , recognized his daughter.  Objective: Vitals:   11/16/18 2128 11/17/18 0500 11/17/18 0504 11/17/18 0610  BP: 115/87  119/74   Pulse: 87  85 84  Resp: 20  18   Temp: 98.3 F (36.8 C)  97.7 F (36.5 C)   TempSrc: Oral  Oral   SpO2: (!) 89% (!) 85% 96% 95%  Weight:      Height:        Intake/Output Summary (Last 24 hours) at 11/17/2018 1249 Last data filed at 11/17/2018 [id number] Gross per  24 hour  Intake 345.15 ml  Output 500 ml  Net -154.85 ml   Filed Weights   11/15/18 1047  Weight: 68.9 kg    Examination:  General exam: no distress noted.  Respiratory system: clear to auscultation.  Cardiovascular system: S1 & S2 heard, IRREGULAR.  Gastrointestinal system: Abdomen is nondistended, soft and nontender. Central nervous system: Alert and confused Extremities: Symmetric 5 x 5 power. Skin: No rashes, lesions or ulcers Psychiatry:  Mood &  affect appropriate.     Data Reviewed: I have personally reviewed following labs and imaging studies  CBC: Recent Labs  Lab 11/14/18 1730 11/14/18 2240 11/15/18 0803 11/15/18 1056 11/16/18 0439 11/17/18 0407  WBC 12.5* 11.9* 12.0* 11.7* 8.9 7.9  NEUTROABS 11.2*  --   --   --  7.6 6.6  HGB 15.8 13.2 12.2* 12.6* 10.8* 12.7*  HCT 52.9* 44.6 41.5 42.6 36.9* 42.2  MCV 103.1* 104.7* 107.0* 104.9* 107.9* 105.0*  PLT 308 211 205 202 167 163   Basic Metabolic Panel: Recent Labs  Lab 11/14/18 1730 11/15/18 0500 11/16/18 0439 11/17/18 0407  NA 159* 156* 153* 152*  K 3.6 3.6 3.3* 3.8  CL 120* 126* 122* 123*  CO2 28 23 26 25   GLUCOSE 138* 147* 112* 108*  BUN 79* 70* 57* 39*  CREATININE 2.58* 2.11* 1.70* 1.46*  CALCIUM 9.3 8.0* 8.1* 8.7*   GFR: Estimated Creatinine Clearance: 32.8 mL/min (A) (by C-G formula based on SCr of 1.46 mg/dL (H)). Liver Function Tests: Recent Labs  Lab 11/14/18 1730  AST 53*  ALT 31  ALKPHOS 82  BILITOT 1.2  PROT 8.0  ALBUMIN 3.1*   No results for input(s): LIPASE, AMYLASE in the last 168 hours. No results for input(s): AMMONIA in the last 168 hours. Coagulation Profile: No results for input(s): INR, PROTIME in the last 168 hours. Cardiac Enzymes: No results for input(s): CKTOTAL, CKMB, CKMBINDEX, TROPONINI in the last 168 hours. BNP (last 3 results) No results for input(s): PROBNP in the last 8760 hours. HbA1C: No results for input(s): HGBA1C in the last 72 hours. CBG: No results for input(s): GLUCAP in the last 168 hours. Lipid Profile: No results for input(s): CHOL, HDL, LDLCALC, TRIG, CHOLHDL, LDLDIRECT in the last 72 hours. Thyroid Function Tests: No results for input(s): TSH, T4TOTAL, FREET4, T3FREE, THYROIDAB in the last 72 hours. Anemia Panel: Recent Labs    11/16/18 0439 11/17/18 0407  VITAMINB12  --  1,761*  FOLATE 10.4  --    Sepsis Labs: Recent Labs  Lab 11/14/18 1730 11/14/18 1904  LATICACIDVEN 2.3* 1.8     Recent Results (from the past 240 hour(s))  Urine culture     Status: None   Collection Time: 11/14/18  6:01 PM  Result Value Ref Range Status   Specimen Description   Final    URINE, CATHETERIZED Performed at [facility], [address] [place], [place] [address]    Special Requests   Final    NONE Performed at [facility], [address] [place], [place] [address]    Culture   Final    NO GROWTH Performed at [facility], [address], [place], [place] [address]    Report Status 11/16/2018 FINAL  Final  Blood culture (routine x 2)     Status: None (Preliminary result)   Collection Time: 11/14/18  8:14 PM  Result Value Ref Range Status   Specimen Description   Final    BLOOD RIGHT  HAND Performed at [facility], [address], [place], [place] [address]    Special Requests   Final    BOTTLES DRAWN AEROBIC AND ANAEROBIC Blood Culture results may not be optimal due to an inadequate volume of blood received in culture bottles Performed at [facility], [address], [place], [place] [address]    Culture   Final    NO GROWTH 2 DAYS Performed at [facility], [address] [place], [place] [address]    Report Status PENDING  Incomplete  Blood culture (routine x 2)     Status: None (Preliminary result)   Collection Time: 11/14/18  8:40 PM  Result Value Ref Range Status   Specimen Description   Final    BLOOD LEFT WRIST Performed at [facility], [address] [place], [place] [address]    Special Requests   Final    BOTTLES DRAWN AEROBIC AND ANAEROBIC Blood Culture adequate volume Performed at [facility], [address] [place], [place] [address]    Culture   Final    NO GROWTH 2 DAYS Performed at [facility], [address], [place], [place] [address]    Report Status PENDING  Incomplete         Radiology  Studies: No results found.      Scheduled Meds: . azithromycin  500 mg Oral QHS  . feeding supplement (ENSURE ENLIVE)  237 mL Oral BID BM  . mouth rinse  15 mL Mouth Rinse BID  . multivitamin with minerals  1 tablet Oral Daily  . nystatin   Topical TID  . pantoprazole (PROTONIX) IV  40 mg Intravenous Q12H   Continuous Infusions: . cefTRIAXone (ROCEPHIN)  IV 1 g (11/17/18 1124)     LOS: 2 days    Time spent: 28 min    Kathlen Mody, MD [facility] Pager [phone] If 7PM-7AM, please contact night-coverage [email or web address] Password [facility] 11/17/2018, 12:49 PM

## 2018-11-17 NOTE — NC FL2 (Signed)
[place] MEDICAID FL2 LEVEL OF CARE SCREENING TOOL     IDENTIFICATION  Patient Name: Tony Reed Birthdate: 06/05/1926 Sex: male Admission Date (Current Location): 11/14/2018  [facility] and [place] Number:  Producer, television/film/video and Address:  [facility],  [address], [place] [address]      Provider Number: [id number]  Attending Physician Name and Address:  Kathlen Mody, MD  Relative Name and Phone Number:       Current Level of Care: Hospital Recommended Level of Care: Skilled Nursing Facility Prior Approval Number:    Date Approved/Denied:   PASRR Number: [id number]  Discharge Plan: SNF    Current Diagnoses: Patient Active Problem List   Diagnosis Date Noted  . Malnutrition of moderate degree 11/15/2018  . Altered mental status 11/14/2018  . Hypernatremia 11/14/2018  . ARF (acute renal failure) (HCC) 11/14/2018  . Pneumonia 11/14/2018  . Acute GI bleeding 11/14/2018  . Chronic kidney disease, stage 3 (HCC) 01/23/2018  . Prolonged Q-T interval on ECG 01/23/2018  . Seborrheic keratoses 09/30/2017  . UTI (urinary tract infection) 09/21/2017  . Chronic heart failure with preserved ejection fraction (HCC) 03/24/2017  . Cough 10/27/2015  . Long toenail 05/16/2015  . Advance care planning 05/16/2015  . Dementia without behavioral disturbance (HCC) 05/16/2015  . Encounter for therapeutic drug monitoring 12/05/2013  . Orthostasis 04/21/2012  . Orthostatic hypotension 03/14/2012  . Syncope 03/09/2012  . Osteoporosis 02/11/2012  . Memory loss 08/11/2011  . Hyperlipidemia LDL goal <70 03/31/2011  . DIASTOLIC HEART FAILURE, CHRONIC 11/26/2010  . DIASTOLIC HEART FAILURE, ACUTE ON CHRONIC 2020-11-809  . Atrial fibrillation (HCC) 06/25/2009  . CARDIOVASCULAR FUNCTION STUDY, ABNORMAL 06/20/2009  . Coronary artery disease involving native coronary artery of native heart without angina pectoris 06/13/2009  . UNSPECIFIED VITAMIN D DEFICIENCY  01/29/2009  . Edema 01/22/2008  . TESTOSTERONE DEFICIENCY 02/22/2007  . B12 deficiency 02/22/2007  . ANEMIA DUE TO CHRONIC BLOOD LOSS 02/22/2007  . ANEMIA, IRON DEFICIENCY 02/22/2007  . CATARACT, LEFT EYE 02/22/2007  . Essential hypertension 02/22/2007  . DIVERTICULOSIS, COLON 02/22/2007  . BENIGN PROSTATIC HYPERTROPHY, HX OF 02/22/2007    Orientation RESPIRATION BLADDER Height & Weight     Self, Place  Normal Incontinent Weight: 151 lb 14.4 oz (68.9 kg) Height:  5\' 11"  (180.3 cm)  BEHAVIORAL SYMPTOMS/MOOD NEUROLOGICAL BOWEL NUTRITION STATUS      Incontinent Diet(full liquid diet- see DC summary for updated diet)  AMBULATORY STATUS COMMUNICATION OF NEEDS Skin   Extensive Assist Verbally Normal                       Personal Care Assistance Level of Assistance  Bathing, Feeding, Dressing Bathing Assistance: Maximum assistance Feeding assistance: Independent Dressing Assistance: Maximum assistance     Functional Limitations Info  Sight, Hearing, Speech Sight Info: Adequate Hearing Info: Impaired Speech Info: Adequate    SPECIAL CARE FACTORS FREQUENCY  PT (By licensed PT), OT (By licensed OT)     PT Frequency: 5x OT Frequency: 5x            Contractures Contractures Info: Not present    Additional Factors Info  Code Status, Allergies Code Status Info: DNR Allergies Info: Namenda Memantine Hcl, Naproxen, Penicillins           Current Medications (11/17/2018):  This is the current hospital active medication list Current Facility-Administered Medications  Medication Dose Route Frequency Provider Last Rate Last Dose  . acetaminophen (TYLENOL) tablet 650 mg  650 mg Oral Q6H PRN Eduard Clos, MD       Or  . acetaminophen (TYLENOL) suppository 650 mg  650 mg Rectal Q6H PRN Eduard Clos, MD      . azithromycin [facility]) tablet 500 mg  500 mg Oral QHS Danford Bad, RPH      . cefTRIAXone (ROCEPHIN) 1 g in sodium chloride 0.9 % 100 mL IVPB   1 g Intravenous Q24H Eduard Clos, MD   Stopped at 11/17/18 1154  . dextrose 5 % solution   Intravenous Continuous Kathlen Mody, MD      . feeding supplement (ENSURE ENLIVE) (ENSURE ENLIVE) liquid 237 mL  237 mL Oral BID BM Kathlen Mody, MD   237 mL at 11/17/18 0856  . MEDLINE mouth rinse  15 mL Mouth Rinse BID Eduard Clos, MD   15 mL at 11/17/18 1132  . multivitamin with minerals tablet 1 tablet  1 tablet Oral Daily Kathlen Mody, MD   1 tablet at 11/17/18 1111  . nystatin (MYCOSTATIN/NYSTOP) topical powder   Topical TID Kathlen Mody, MD      . pantoprazole (PROTONIX) injection 40 mg  40 mg Intravenous Q12H Meredith Pel, NP   40 mg at 11/17/18 1112     Discharge Medications: Please see discharge summary for a list of discharge medications.  Relevant Imaging Results:  Relevant Lab Results:   Additional Information SS# [id number]  Nelwyn Salisbury, LCSW

## 2018-11-17 NOTE — Progress Notes (Signed)
     [facility] Progress Note   Chief Complaint:   Anemia , heme positive stool    SUBJECTIVE:       ASSESSMENT AND PLAN:   1. 85 yo male with multiple medical problems admitted with FTT, AKI on CKD with electrolyte disturbances ( Na+ of 159) -Na+ slowly improving but still quite elevated at 152.  -Renal function improving. Cr down to 1.46.  2. Macrocytic anemia / heme positive stools on Xarelto. Stools dark (at home) on iron.  Mild drop in hgb yesterday from baseline of 11-12 range to 10.8 after IV hydration. Today hgb back up to 12.7. Per RN, yesterday patient had a black stool -folate, B12 normal.  -continue IV PPI -Black BM yesterday per RN. No stools today. Stools may still be black due to taking iron at home though they were heme positive on admission. Could be slow GI bleed. Since he remains stable with improvement in hgb we will hold off on EGD for now. Will continue to follow over weekend. If stools remain black (off iron) and / or hgb declines then we can proceed with EGD to at least determine risk of resuming Xarelto (provided family agrees to procedure). Of course we will need to wait for hypernatremia to improve  OBJECTIVE:     Vital signs in last 24 hours: Temp:  [97.7 F (36.5 C)-98.7 F (37.1 C)] 98.4 F (36.9 C) (01/31 1346) Pulse Rate:  [84-91] 89 (01/31 1346) Resp:  [18-20] 18 (01/31 1346) BP: (114-124)/(74-87) 114/77 (01/31 1346) SpO2:  [85 %-98 %] 98 % (01/31 1346) Last BM Date: 11/16/18 General:   Alert, confused male in NAD EENT:  Normal hearing, non icteric sclera, conjunctive pink.  Heart:  Regular rate and rhythm.  No lower extremity edema   Pulm: Normal respiratory effor. Abdomen:  Soft, nondistended, nontender.  Normal bowel sounds, no masses felt.       Neurologic:  Alert , confused  Psych:  Pleasant, cooperative.    Intake/Output from previous day: 01/30 0701 - 01/31 0700 In: 859.2 [P.O.:477; I.V.:282.2; IV  Piggyback:100] Out: 825 [Urine:825] Intake/Output this shift: Total I/O In: 350 [IV Piggyback:350] Out: -   Lab Results: Recent Labs    11/15/18 1056 11/16/18 0439 11/17/18 0407  WBC 11.7* 8.9 7.9  HGB 12.6* 10.8* 12.7*  HCT 42.6 36.9* 42.2  PLT 202 167 163   BMET Recent Labs    11/15/18 0500 11/16/18 0439 11/17/18 0407  NA 156* 153* 152*  K 3.6 3.3* 3.8  CL 126* 122* 123*  CO2 23 26 25   GLUCOSE 147* 112* 108*  BUN 70* 57* 39*  CREATININE 2.11* 1.70* 1.46*  CALCIUM 8.0* 8.1* 8.7*   LFT Recent Labs    11/14/18 1730  PROT 8.0  ALBUMIN 3.1*  AST 53*  ALT 31  ALKPHOS 82  BILITOT 1.2     Principal Problem:   ARF (acute renal failure) (HCC) Active Problems:   Atrial fibrillation (HCC)   Chronic heart failure with preserved ejection fraction (HCC)   Hypernatremia   Pneumonia   Acute GI bleeding   Malnutrition of moderate degree     LOS: 2 days   Willette ClusterPaula  ,NP 11/17/2018, 1:55 PM

## 2018-11-17 NOTE — Evaluation (Signed)
Occupational Therapy Evaluation Patient Details Name: Tony Reed MRN: [id number] DOB: [birth year] Today's Date: 11/17/2018    History of Present Illness 85 yo male admitted with GI bleed, confusion, ARF. Hx of dementia, A fib, CAD, CHF   Clinical Impression   This 85 yo male admitted with above presents to acute OT with decreased safety awareness and decreased balance both affecting his safety with being able to do even A with basic ADLs. He will benefit from acute OT with follow up OT at SNF.    Follow Up Recommendations  SNF;Supervision/Assistance - 24 hour;Other (comment)(unless family can manage his current level of care at home then [facility])    Equipment Recommendations  3 in 1 bedside commode(if they don't have one)       Precautions / Restrictions Precautions Precautions: Fall Restrictions Weight Bearing Restrictions: No      Mobility Bed Mobility Overal bed mobility: Needs Assistance Bed Mobility: Supine to Sit;Sit to Supine     Supine to sit: Min guard;HOB elevated(use of rail) Sit to supine: Min guard;HOB elevated      Transfers Overall transfer level: Needs assistance Equipment used: Rolling walker (2 wheeled) Transfers: Sit to/from Stand Sit to Stand: Min assist         General transfer comment: increased time and cues for safe hand placement from bed; from/to recliner he automatically reached back for or pushed up from arms. Pt ambulated with min A 15 feet, 20 feet, and 20 feet with RW.    Balance Overall balance assessment: Needs assistance Sitting-balance support: No upper extremity supported;Feet unsupported Sitting balance-Leahy Scale: Good Sitting balance - Comments: could sit EOB and adjust his socks without LOB   Standing balance support: Single extremity supported Standing balance-Leahy Scale: Poor Standing balance comment: Pt noticed yellow circles that are on the floor, he kept one hand on RW and bent over/squatted down to try  and pick them up--min A for balance                           ADL either performed or assessed with clinical judgement   ADL Overall ADL's : Needs assistance/impaired Eating/Feeding: Set up;Supervision/ safety;Sitting   Grooming: Set up;Supervision/safety;Sitting;Wash/dry face   Upper Body Bathing: Moderate assistance;Sitting   Lower Body Bathing: Maximal assistance Lower Body Bathing Details (indicate cue type and reason): min A sit<>stand Upper Body Dressing : Maximal assistance;Sitting   Lower Body Dressing: Maximal assistance Lower Body Dressing Details (indicate cue type and reason): min A sit<>stand Toilet Transfer: Minimal assistance;Ambulation;RW   Toileting- Clothing Manipulation and Hygiene: Maximal assistance Toileting - Clothing Manipulation Details (indicate cue type and reason): min A sit<>stand             Vision Patient Visual Report: No change from baseline              Pertinent Vitals/Pain Pain Assessment: Faces Faces Pain Scale: No hurt     Hand Dominance Right   Extremity/Trunk Assessment Upper Extremity Assessment Upper Extremity Assessment: Overall WFL for tasks assessed           Communication Communication Communication: HOH   Cognition Arousal/Alertness: Awake/alert Behavior During Therapy: Restless Overall Cognitive Status: No family/caregiver present to determine baseline cognitive functioning                                 General Comments: Pt with h/o dementia  not sure if he is at baseline or worse; decreased safety with RW--put it off to the side several times              Home Living Family/patient expects to be discharged to:: Unsure                                        Prior Functioning/Environment          Comments: unsure of PLOF. No family present.        OT Problem List: Impaired balance (sitting and/or standing);Decreased cognition;Decreased safety  awareness      OT Treatment/Interventions: Self-care/ADL training;Balance training;DME and/or AE instruction;Patient/family education    OT Goals(Current goals can be found in the care plan section) Acute Rehab OT Goals Patient Stated Goal: Pt unable OT Goal Formulation: Patient unable to participate in goal setting Time For Goal Achievement: 12/01/18 Potential to Achieve Goals: Fair  OT Frequency: Min 2X/week              AM-PAC OT "6 Clicks" Daily Activity     Outcome Measure Help from another person eating meals?: A Little Help from another person taking care of personal grooming?: A Little Help from another person toileting, which includes using toliet, bedpan, or urinal?: A Lot Help from another person bathing (including washing, rinsing, drying)?: A Lot Help from another person to put on and taking off regular upper body clothing?: A Lot Help from another person to put on and taking off regular lower body clothing?: A Lot 6 Click Score: 14   End of Session Equipment Utilized During Treatment: Gait belt;Rolling walker Nurse Communication: (NT A'd with pushing recliner behind pt; RN saw pt ambulating in hallway)  Activity Tolerance: Patient tolerated treatment well Patient left: in bed;with call bell/phone within reach;with bed alarm set  OT Visit Diagnosis: Unsteadiness on feet (R26.81);Other abnormalities of gait and mobility (R26.89);Other symptoms and signs involving cognitive function                Time: 1250-1312 OT Time Calculation (min): 22 min Charges:  OT General Charges $OT Visit: 1 Visit OT Evaluation $OT Eval Moderate Complexity: 1 Mod  Ignacia Palmaathy Denine Brotz, OTR/L [facility] Pager [phone] Office [phone]     Evette GeorgesLeonard, Conor Filsaime Eva 11/17/2018, 1:27 PM

## 2018-11-18 DIAGNOSIS — Z515 Encounter for palliative care: Secondary | ICD-10-CM

## 2018-11-18 DIAGNOSIS — N179 Acute kidney failure, unspecified: Secondary | ICD-10-CM

## 2018-11-18 DIAGNOSIS — K922 Gastrointestinal hemorrhage, unspecified: Secondary | ICD-10-CM

## 2018-11-18 DIAGNOSIS — Z7189 Other specified counseling: Secondary | ICD-10-CM

## 2018-11-18 LAB — CBC WITH DIFFERENTIAL/PLATELET
Abs Immature Granulocytes: 0.04 10*3/uL (ref 0.00–0.07)
Basophils Absolute: 0 10*3/uL (ref 0.0–0.1)
Basophils Relative: 0 %
Eosinophils Absolute: 0.1 10*3/uL (ref 0.0–0.5)
Eosinophils Relative: 1 %
HEMATOCRIT: 39 % (ref 39.0–52.0)
Hemoglobin: 11.7 g/dL — ABNORMAL LOW (ref 13.0–17.0)
Immature Granulocytes: 1 %
LYMPHS ABS: 0.9 10*3/uL (ref 0.7–4.0)
Lymphocytes Relative: 13 %
MCH: 31.8 pg (ref 26.0–34.0)
MCHC: 30 g/dL (ref 30.0–36.0)
MCV: 106 fL — ABNORMAL HIGH (ref 80.0–100.0)
Monocytes Absolute: 0.5 10*3/uL (ref 0.1–1.0)
Monocytes Relative: 7 %
Neutro Abs: 5.5 10*3/uL (ref 1.7–7.7)
Neutrophils Relative %: 78 %
Platelets: 163 10*3/uL (ref 150–400)
RBC: 3.68 MIL/uL — ABNORMAL LOW (ref 4.22–5.81)
RDW: 13.2 % (ref 11.5–15.5)
WBC: 7 10*3/uL (ref 4.0–10.5)
nRBC: 0 % (ref 0.0–0.2)

## 2018-11-18 LAB — BASIC METABOLIC PANEL
Anion gap: 8 (ref 5–15)
BUN: 28 mg/dL — AB (ref 8–23)
CO2: 23 mmol/L (ref 22–32)
Calcium: 8.3 mg/dL — ABNORMAL LOW (ref 8.9–10.3)
Chloride: 115 mmol/L — ABNORMAL HIGH (ref 98–111)
Creatinine, Ser: 1.19 mg/dL (ref 0.61–1.24)
GFR calc Af Amer: >60 mL/min (ref >60)
GFR calc non Af Amer: 53 mL/min — ABNORMAL LOW (ref >60)
GLUCOSE: 102 mg/dL — AB (ref 70–99)
Potassium: 3.5 mmol/L (ref 3.5–5.1)
Sodium: 146 mmol/L — ABNORMAL HIGH (ref 135–145)

## 2018-11-18 MED ORDER — RIVAROXABAN 15 MG PO TABS
15.0000 mg | ORAL_TABLET | Freq: Every day | ORAL | Status: DC
  Administered 2018-11-19 – 2018-11-23 (×5): 15 mg via ORAL
  Filled 2018-11-18 (×6): qty 1

## 2018-11-18 MED ORDER — LORAZEPAM 2 MG/ML IJ SOLN
1.0000 mg | Freq: Once | INTRAMUSCULAR | Status: AC
  Administered 2018-11-18: 1 mg via INTRAVENOUS
  Filled 2018-11-18: qty 1

## 2018-11-18 MED ORDER — RIVAROXABAN 15 MG PO TABS
15.0000 mg | ORAL_TABLET | Freq: Once | ORAL | Status: AC
  Administered 2018-11-18: 15 mg via ORAL
  Filled 2018-11-18: qty 1

## 2018-11-18 NOTE — Progress Notes (Signed)
PROGRESS NOTE    Tony Reed  ZOX:[id number] DOB: Sep 26, 1926 DOA: 11/14/2018 PCP: Joaquim Namuncan, Graham S, MD    Brief Narrative: Tony Reed is a 85 y.o. male with history of CAD status post PCI, diastolic CHF, atrial fibrillation, dementia, chronic kidney disease, chronic anemia was brought to the ER after patient was found to have increasing confusion.  As per the family patient has not been eating well for the last 5 to 6 days.  Patient has become more confused and was taken to the primary care office and was referred to the ER for further work-up. Over the last few days patient also has noticed to have some dark stools.  Does not take any NSAIDs but patient is on Xarelto for A. Fib.   In the ER patient has periods of confusion.  Able to move all extremities follows commands.  CT head was unremarkable.  Chest x-ray shows possibility of pneumonia.  UA is unremarkable.  Patient was afebrile.  Stool for occult blood was positive.  Hemoglobin is around 15.  Creatinine has increased from 1.25 two months ago to 2.5.  Sodium is 159 WBC count is 12.5.  Patient was given fluid bolus due to dehydration and started on D5W for hypernatremia with renal failure.  Protonix started for GI bleed.  Patient admitted for acute encephalopathy with acute renal failure hyponatremia GI bleed and pneumonia.   Assessment & Plan:   Principal Problem:   ARF (acute renal failure) (HCC) Active Problems:   Atrial fibrillation (HCC)   Chronic heart failure with preserved ejection fraction (HCC)   Hypernatremia   Pneumonia   Acute GI bleeding   Malnutrition of moderate degree   Heme positive stool   Acute on chronic kidney disease stage III ,with hypernatremia probably secondary to dehydration and poor oral intake. Sodium has improved from 159-156 to 153 to 152 to 146.  And creatinine has improved from 2.58 to 1.7 to 1.4 to 1.1.  Continue with IV fluids at this time. Lactic acid within normal limits. Urine  analysis is negative for infection. Overnight patient has had some agitation and he was given Ativan.  Patient is sound asleep at this time without any distress.  Chronic Atrial fibrillation, Rate controlled Since patient did not have any further drop in hemoglobin and there are no active signs of bleeding after discussion with GI we will go ahead and resume Xarelto and watch for any drop in hemoglobin.  Family in agreement.   Guaiac positive stools possibly blood loss from GI tract H&H stable. Resume PPI.  GI consulted and recommendations given no plans for EGD at this time.  But if his hemoglobin drops while on Xarelto then GI will proceed with EGD in the future. Continue to monitor.   Hypokalemia replaced.    Acute metabolic encephalopathy probably secondary to hypernatremia and AKI Improving, daughter feels patient is almost back to baseline.  Continue to monitor. Overnight patient had some restlessness and he was trying to get out of bed he was given a dose of anxiolytic and patient is sound asleep at this time.  History of chronic diastolic heart failure Appears euvolemic.   Community acquired pneumonia:  Resume rocephin and zithromax and plan to transition to Augmentin on discharge.  Transition to Augmentin tomorrow.  DVT prophylaxis: SCDs Code Status: DNR Family Communication: discussed with daughter at bedside on 11/17/2018 Disposition Plan: Pending clinical improvement  Consultants:   Palliative care consult  Gastroenterology consult.   Procedures: None Antimicrobials:  rocephin and zithromax since admission.  Subjective: Patient is sound asleep, does not appear to be in any distress. Objective: Vitals:   11/17/18 1346 11/17/18 2200 11/18/18 0607 11/18/18 1239  BP: 114/77 (!) 155/94 (!) 155/84 (!) 147/88  Pulse: 89 85 76 69  Resp: 18 18 19 16   Temp: 98.4 F (36.9 C) 97.7 F (36.5 C) 98 F (36.7 C) 97.8 F (36.6 C)  TempSrc: Oral Oral Oral   SpO2: 98% 93%  98% 97%  Weight:      Height:        Intake/Output Summary (Last 24 hours) at 11/18/2018 1637 Last data filed at 11/17/2018 1803 Gross per 24 hour  Intake 104.17 ml  Output -  Net 104.17 ml   Filed Weights   11/15/18 1047  Weight: 68.9 kg    Examination:  General exam: no distress noted.  Comfortable sleeping Respiratory system: clear to auscultation.  No wheezing or rhonchi Cardiovascular system: S1 & S2 heard, IRREGULAR.  Gastrointestinal system: Abdomen is nondistended, soft and nontender.  Bowel sounds are good Central nervous system: Briefly wakes to sternal rub. Extremities: Symmetric 5 x 5 power.  No pedal edema Skin: No rashes, lesions or ulcers Psychiatry: Could not be assessed today    Data Reviewed: I have personally reviewed following labs and imaging studies  CBC: Recent Labs  Lab 11/14/18 1730  11/15/18 0803 11/15/18 1056 11/16/18 0439 11/17/18 0407 11/18/18 0555  WBC 12.5*   < > 12.0* 11.7* 8.9 7.9 7.0  NEUTROABS 11.2*  --   --   --  7.6 6.6 5.5  HGB 15.8   < > 12.2* 12.6* 10.8* 12.7* 11.7*  HCT 52.9*   < > 41.5 42.6 36.9* 42.2 39.0  MCV 103.1*   < > 107.0* 104.9* 107.9* 105.0* 106.0*  PLT 308   < > 205 202 167 163 163   < > = values in this interval not displayed.   Basic Metabolic Panel: Recent Labs  Lab 11/14/18 1730 11/15/18 0500 11/16/18 0439 11/17/18 0407 11/18/18 0555  NA 159* 156* 153* 152* 146*  K 3.6 3.6 3.3* 3.8 3.5  CL 120* 126* 122* 123* 115*  CO2 28 23 26 25 23   GLUCOSE 138* 147* 112* 108* 102*  BUN 79* 70* 57* 39* 28*  CREATININE 2.58* 2.11* 1.70* 1.46* 1.19  CALCIUM 9.3 8.0* 8.1* 8.7* 8.3*   GFR: Estimated Creatinine Clearance: 40.2 mL/min (by C-G formula based on SCr of 1.19 mg/dL). Liver Function Tests: Recent Labs  Lab 11/14/18 1730  AST 53*  ALT 31  ALKPHOS 82  BILITOT 1.2  PROT 8.0  ALBUMIN 3.1*   No results for input(s): LIPASE, AMYLASE in the last 168 hours. No results for input(s): AMMONIA in the last  168 hours. Coagulation Profile: No results for input(s): INR, PROTIME in the last 168 hours. Cardiac Enzymes: No results for input(s): CKTOTAL, CKMB, CKMBINDEX, TROPONINI in the last 168 hours. BNP (last 3 results) No results for input(s): PROBNP in the last 8760 hours. HbA1C: No results for input(s): HGBA1C in the last 72 hours. CBG: No results for input(s): GLUCAP in the last 168 hours. Lipid Profile: No results for input(s): CHOL, HDL, LDLCALC, TRIG, CHOLHDL, LDLDIRECT in the last 72 hours. Thyroid Function Tests: No results for input(s): TSH, T4TOTAL, FREET4, T3FREE, THYROIDAB in the last 72 hours. Anemia Panel: Recent Labs    11/16/18 0439 11/17/18 0407  VITAMINB12  --  1,761*  FOLATE 10.4  --    Sepsis Labs:  Recent Labs  Lab 11/14/18 1730 11/14/18 1904  LATICACIDVEN 2.3* 1.8    Recent Results (from the past 240 hour(s))  Urine culture     Status: None   Collection Time: 11/14/18  6:01 PM  Result Value Ref Range Status   Specimen Description   Final    URINE, CATHETERIZED Performed at [facility], [address] [place], [place] [address]    Special Requests   Final    NONE Performed at [facility], [address] [place], [place] [address]    Culture   Final    NO GROWTH Performed at [facility], [address], [place], [place] [address]    Report Status 11/16/2018 FINAL  Final  Blood culture (routine x 2)     Status: None (Preliminary result)   Collection Time: 11/14/18  8:14 PM  Result Value Ref Range Status   Specimen Description BLOOD RIGHT HAND  Final   Special Requests   Final    BOTTLES DRAWN AEROBIC AND ANAEROBIC Blood Culture results may not be optimal due to an inadequate volume of blood received in culture bottles Performed at [facility], [address], [place], [place] [address]    Culture NO GROWTH 3 DAYS  Final   Report Status PENDING  Incomplete  Blood culture  (routine x 2)     Status: None (Preliminary result)   Collection Time: 11/14/18  8:40 PM  Result Value Ref Range Status   Specimen Description BLOOD LEFT WRIST  Final   Special Requests   Final    BOTTLES DRAWN AEROBIC AND ANAEROBIC Blood Culture adequate volume Performed at [facility], [address] [place], [place] [address]    Culture NO GROWTH 3 DAYS  Final   Report Status PENDING  Incomplete         Radiology Studies: No results found.      Scheduled Meds: . azithromycin  500 mg Oral QHS  . feeding supplement (ENSURE ENLIVE)  237 mL Oral BID BM  . mouth rinse  15 mL Mouth Rinse BID  . multivitamin with minerals  1 tablet Oral Daily  . nystatin   Topical TID  . pantoprazole (PROTONIX) IV  40 mg Intravenous Q12H  . rivaroxaban  15 mg Oral Once  . [START ON 11/19/2018] rivaroxaban  15 mg Oral Q supper   Continuous Infusions: . cefTRIAXone (ROCEPHIN)  IV 1 g (11/18/18 0940)  . dextrose 50 mL/hr at 11/18/18 0931     LOS: 3 days    Time spent: 28 min    Kathlen Mody, MD [facility] Pager [phone] If 7PM-7AM, please contact night-coverage [email or web address] Password [facility] 11/18/2018, 4:37 PM

## 2018-11-18 NOTE — Progress Notes (Signed)
ANTICOAGULATION CONSULT NOTE   Pharmacy Consult for xarelto Indication: hx atrial fibrillation  Allergies  Allergen Reactions  . Namenda [Memantine Hcl] Other (See Comments)    diarrhea  . Naproxen Other (See Comments)    ABD PAIN, DIARRHEA  . Penicillins Other (See Comments)    Was able to tolerate keflex 08/2017    Patient Measurements: Height: 5\' 11"  (180.3 cm) Weight: 151 lb 14.4 oz (68.9 kg) IBW/kg (Calculated) : 75.3 Heparin Dosing Weight:   Vital Signs: Temp: 98 F (36.7 C) (02/01 0607) Temp Source: Oral (02/01 0607) BP: 155/84 (02/01 0607) Pulse Rate: 76 (02/01 0607)  Labs: Recent Labs    11/16/18 0439 11/17/18 0407 11/18/18 0555  HGB 10.8* 12.7* 11.7*  HCT 36.9* 42.2 39.0  PLT 167 163 163  CREATININE 1.70* 1.46* 1.19    Estimated Creatinine Clearance: 40.2 mL/min (by C-G formula based on SCr of 1.19 mg/dL).   Medications:  - on xarelto 15 mg daily PTA  Assessment: Patient is a 85 y.o M with hx Afib on xarelto PTA presented to the ED on 1/28 with c/o fatigue and dark stools with hemeoccult positive on admission. Stools are no longer dark and hgb remains stable. GI recommends to hold off on EGD for now and to resume xarelto back on 2/1.  - scr trending down (1.19, crcl~40)   Plan:  - resume home xarelto dose of 15 mg PO daily - monitor s/s bleeding  Tony Reed P 11/18/2018,11:18 AM

## 2018-11-18 NOTE — Consult Note (Signed)
Consultation Note Date: 11/18/2018   Patient Name: Tony Reed  DOB: [birth year]  MRN: [id number]  Age / Sex: 85 y.o., male  PCP: Tony Nam, MD Referring Physician: Kathlen Mody, MD  Reason for Consultation: Establishing goals of care  HPI/Patient Profile: 85 y.o. male  with past medical history of CAD s/p PCI, CHF, a fib, dementia, CKD, anemia admitted on 11/14/2018 with AKI, Guaiac + stool, CAP, and metabolic encephalopathy.  Palliative consulted for GOC.    Clinical Assessment and Goals of Care: Tony Reed is sleepy today following Ativan for agitation last night.  I did check in on him briefly last night and he was up and alert and joking at that time.  I called and reached his daughter, Tony Reed, via phone.  I introduced palliative care as specialized medical care for people living with serious illness. It focuses on providing relief from the symptoms and stress of a serious illness. The goal is to improve quality of life for both the patient and the family.  We discussed clinical course as well as wishes moving forward in regard to advanced directives.  Concepts specific to code status and rehospitalization discussed.  We discussed difference between a aggressive medical intervention path and a palliative, comfort focused care path.  Values and goals of care important to patient and family were attempted to be elicited.  Tony Reed reports that family understands that her father is elderly with multiple chronic conditions.  He lives with one of his sons and the rest of family help with his care.  They have noticed continued decline in his functional status over the last few months.  She reports that family wants to continue to provide reasonable care if there are medical conditions that can be treated.  She believes that this would include EGD if needed, but reports she needs to discuss again with  her siblings.  Concept of Palliative Care were discussed  Questions and concerns addressed.   PMT will continue to support holistically.  SUMMARY OF RECOMMENDATIONS   - DNR/DNI - Continue current scope treatment.  Family is hopeful for continued improvement but are open to further conversation if his clinical course worsens. - Will plan to check in again on Tony Reed on Monday.  Please call if needs before this time.  Code Status/Advance Care Planning:  DNR   Symptom Management:  Standard delirium management (adapted from NICE guidelines 2011 for prevention of delirium):  Provide continuity of care when possible (avoid frequent changing of surroundings and staff).  Frequent reorientation to time with:  A clock should always be visible.  Make sure Calendar/white board is updated.  Lights on/blinds open during the day and off/closed at night.  Encourage frequent family visits.  Monitor for and treat dehydration/constipation.  Optimize oxygen saturation.  Avoid catheters and IV's when possible and look for/treat infections.  Encourage early mobility.  Assess and treat pain.  Ensure adequate nutrition and functioning dentures.  Address reversible causes of hearing and visual impairment:  Use pocket talker if  hearing aids are unavailable.  Avoid sleep disturbance (normalize sleep/wake cycle).  Minimize disturbances and consider NOT obtaining vitals at night if possible.  Review Medications to avoid polypharmacy and avoid deliriogenic medications when possible:  Benzodiazepines.  Dihydropyridines.  Antihistamines.  Anticholinergics.  (Possibly avoid: H2 blockers, tricyclic antidepressants, antiparkinson medications, steroids, NSAID's).     Palliative Prophylaxis:   Delirium Protocol  Additional Recommendations (Limitations, Scope, Preferences):  Full Scope Treatment  Psycho-social/Spiritual:   Desire for further Chaplaincy support:no  Additional Recommendations:  Caregiving  Support/Resources  Prognosis:   Unable to determine  Discharge Planning: To Be Determined      Primary Diagnoses: Present on Admission: . Hypernatremia . ARF (acute renal failure) (HCC) . Pneumonia . Acute GI bleeding . Atrial fibrillation (HCC) . Chronic heart failure with preserved ejection fraction (HCC)   I have reviewed the medical record, interviewed the patient and family, and examined the patient. The following aspects are pertinent.  Past Medical History:  Diagnosis Date  . CAD (coronary artery disease)    inferior MI in 03-07-2002 w BMS to RCA. DES to CFX in 03-08-07.  Normal Myoviews in 5/13.  Marland Kitchen CHF (congestive heart failure) (HCC)    diastolic: Echo (4/11) w EF 50-60%, moderate MR, moderate to sever LAE, severe RAE, moderate to sever TR  . CKD (chronic kidney disease)   . Diverticulitis, colon   . GERD (gastroesophageal reflux disease)   . Hyperlipidemia   . Hypertension   . Leg edema    L>R chronically  . Leg pain    and decrease pulses consistent w peripheral vasc disease  . Memory difficulty   . Osteoporosis    s/p 5+ years of fosamax treatment as of 03-07-2012  . Paroxysmal atrial fibrillation (HCC)    w history of DCCV  . UTI (urinary tract infection)    with altered mentation 03-07-2017   Social History   Socioeconomic History  . Marital status: Married    Spouse name: Not on file  . Number of children: 6  . Years of education: Not on file  . Highest education level: Not on file  Occupational History    Employer: RETIRED  Social Needs  . Financial resource strain: Not on file  . Food insecurity:    Worry: Not on file    Inability: Not on file  . Transportation needs:    Medical: Not on file    Non-medical: Not on file  Tobacco Use  . Smoking status: Never Smoker  . Smokeless tobacco: Never Used  Substance and Sexual Activity  . Alcohol use: No  . Drug use: No  . Sexual activity: Yes  Lifestyle  . Physical activity:    Days per week: Not  on file    Minutes per session: Not on file  . Stress: Not on file  Relationships  . Social connections:    Talks on phone: Not on file    Gets together: Not on file    Attends religious service: Not on file    Active member of club or organization: Not on file    Attends meetings of clubs or organizations: Not on file    Relationship status: Not on file  Other Topics Concern  . Not on file  Social History Narrative   Retired. Lives w/son in [place].    Wife died 2017-03-07- due to dementia, patient may not recall that his wife has died   Family History  Problem Relation Age of Onset  . Hypertension  Mother   . Leukemia Mother   . Lung cancer Brother        smoking  . Heart Problems Brother        smoker  . Deafness Brother    Scheduled Meds: . feeding supplement (ENSURE ENLIVE)  237 mL Oral BID BM  . mouth rinse  15 mL Mouth Rinse BID  . multivitamin with minerals  1 tablet Oral Daily  . nystatin   Topical TID  . pantoprazole (PROTONIX) IV  40 mg Intravenous Q12H  . [START ON 11/19/2018] rivaroxaban  15 mg Oral Q supper   Continuous Infusions: . cefTRIAXone (ROCEPHIN)  IV 1 g (11/18/18 0940)  . dextrose 50 mL/hr at 11/18/18 0931   PRN Meds:.acetaminophen **OR** acetaminophen Medications Prior to Admission:  Prior to Admission medications   Medication Sig Start Date End Date Taking? Authorizing Provider  atorvastatin (LIPITOR) 80 MG tablet TAKE 1 TABLET BY MOUTH EVERY DAY 10/04/18  Yes End, Cristal Deerhristopher, MD  ferrous sulfate 325 (65 FE) MG tablet Take 325 mg by mouth daily with breakfast.     Yes [provider]  furosemide (LASIX) 40 MG tablet TAKE 1 TABLET(40 MG) BY MOUTH DAILY 10/19/18  Yes Laurey MoraleMcLean, Dalton S, MD  potassium chloride SA (K-DUR,KLOR-CON) 20 MEQ tablet Take 1 tablet (20 mEq total) by mouth daily. 01/25/18  Yes End, Cristal Deerhristopher, MD  ramipril (ALTACE) 2.5 MG capsule TAKE 1 CAPSULE(2.5 MG) BY MOUTH DAILY 01/27/18  Yes End, Cristal Deerhristopher, MD  vitamin B-12  (CYANOCOBALAMIN) 1000 MCG tablet Take 1 tablet (1,000 mcg total) by mouth daily. 05/20/16  Yes Tony Namuncan, Graham S, MD  XARELTO 15 MG TABS tablet TAKE 1 TABLET(15 MG) BY MOUTH DAILY WITH SUPPER 07/12/18  Yes End, Cristal Deerhristopher, MD   Allergies  Allergen Reactions  . Namenda [Memantine Hcl] Other (See Comments)    diarrhea  . Naproxen Other (See Comments)    ABD PAIN, DIARRHEA  . Penicillins Other (See Comments)    Was able to tolerate keflex 08/2017   Review of Systems  Endorsed being tired and weak.  Physical Exam  General: Sleepy, in no acute distress.  HEENT: No bruits, no goiter, no JVD Heart: irregular. No murmur appreciated. Lungs: Good air movement, clear Abdomen: Soft, nontender, nondistended, positive bowel sounds.  Ext: No significant edema Skin: Warm and dry  Vital Signs: BP (!) 147/87 (BP Location: Left Arm)   Pulse 82   Temp 98.3 F (36.8 C) (Oral)   Resp 19   Ht 5\' 11"  (1.803 m)   Wt 68.9 kg   SpO2 100%   BMI 21.19 kg/m  Pain Scale: PAINAD   Pain Score: 0-No pain   SpO2: SpO2: 100 % O2 Device:SpO2: 100 % O2 Flow Rate: .O2 Flow Rate (L/min): 2 L/min  IO: Intake/output summary:   Intake/Output Summary (Last 24 hours) at 11/18/2018 2225 Last data filed at 11/18/2018 1800 Gross per 24 hour  Intake 550 ml  Output -  Net 550 ml    LBM: Last BM Date: 11/17/18 Baseline Weight: Weight: 68.9 kg Most recent weight: Weight: 68.9 kg     Palliative Assessment/Data:   Flowsheet Rows     Most Recent Value  Intake Tab  Referral Department  Hospitalist  Unit at Time of Referral  Med/Surg Unit  Palliative Care Primary Diagnosis  Nephrology  Date Notified  11/16/18  Palliative Care Type  New Palliative care  Reason for referral  Clarify Goals of Care  Date of Admission  11/14/18  #  of days IP prior to Palliative referral  2  Clinical Assessment  Psychosocial & Spiritual Assessment  Palliative Care Outcomes      Time Total: 60 Greater than 50%  of this time  was spent counseling and coordinating care related to the above assessment and plan.  Signed by: Romie Minus, MD   Please contact Palliative Medicine Team phone at [phone] for questions and concerns.  For individual provider: See Loretha Stapler

## 2018-11-18 NOTE — Progress Notes (Signed)
     [facility] Progress Note   Chief Complaint:   Anemia , heme positive stool    SUBJECTIVE:    sleeping.    ASSESSMENT AND PLAN:   1. 85 yo male with multiple medical problems admitted with FTT, AKI on CKD with electrolyte disturbances ( Na+ of 159) -Na+ improved.  -Renal function is now normal.   2. Macrocytic anemia / heme positive stools on Xarelto. Stools dark (at home) on iron. Hgb fluctuating between mid 10 to mid 12.  -folate, B12 normal.  -continue IV PPI -Spoke with RN, stools now brown. No longer on oral iron. Since no downward trend in hgb, stools now brown our plan is to hold off on EGD.  -Can resume anticoagulant. Hold oral iron . If recurrent black stool / hgb decline then EGD. I called daughter Tony Reed to discuss. She will talk with other siblings and make sure okay with EGD should it be necessary.    OBJECTIVE:     Vital signs in last 24 hours: Temp:  [97.7 F (36.5 C)-98.4 F (36.9 C)] 98 F (36.7 C) (02/01 0607) Pulse Rate:  [76-89] 76 (02/01 0607) Resp:  [18-19] 19 (02/01 0607) BP: (114-155)/(77-94) 155/84 (02/01 0607) SpO2:  [93 %-98 %] 98 % (02/01 0607) Last BM Date: 11/17/18 General:   Asleep, NAD  Heart:  Regular rate and rhythm; + murmur.  No lower extremity edema   Pulm: Normal respiratory effor Abdomen:  Soft, nondistended, nontender.  Normal bowel sounds, no masses felt.       Neurologic:  Alert and  oriented x4;  grossly normal neurologically.    Intake/Output from previous day: 01/31 0701 - 02/01 0700 In: 517.6 [I.V.:167.6; IV Piggyback:350] Out: -  Intake/Output this shift: No intake/output data recorded.  Lab Results: Recent Labs    11/16/18 0439 11/17/18 0407 11/18/18 0555  WBC 8.9 7.9 7.0  HGB 10.8* 12.7* 11.7*  HCT 36.9* 42.2 39.0  PLT 167 163 163   BMET Recent Labs    11/16/18 0439 11/17/18 0407 11/18/18 0555  NA 153* 152* 146*  K 3.3* 3.8 3.5  CL 122* 123* 115*  CO2 26 25 23   GLUCOSE 112*  108* 102*  BUN 57* 39* 28*  CREATININE 1.70* 1.46* 1.19  CALCIUM 8.1* 8.7* 8.3*     Principal Problem:   ARF (acute renal failure) (HCC) Active Problems:   Atrial fibrillation (HCC)   Chronic heart failure with preserved ejection fraction (HCC)   Hypernatremia   Pneumonia   Acute GI bleeding   Malnutrition of moderate degree   Heme positive stool     LOS: 3 days   Willette Cluster ,NP 11/18/2018, 10:05 AM

## 2018-11-19 LAB — BASIC METABOLIC PANEL
Anion gap: 7 (ref 5–15)
BUN: 19 mg/dL (ref 8–23)
CO2: 24 mmol/L (ref 22–32)
Calcium: 8.3 mg/dL — ABNORMAL LOW (ref 8.9–10.3)
Chloride: 111 mmol/L (ref 98–111)
Creatinine, Ser: 1.05 mg/dL (ref 0.61–1.24)
GFR calc Af Amer: >60 mL/min (ref >60)
GFR calc non Af Amer: >60 mL/min (ref >60)
Glucose, Bld: 102 mg/dL — ABNORMAL HIGH (ref 70–99)
Potassium: 4.5 mmol/L (ref 3.5–5.1)
Sodium: 142 mmol/L (ref 135–145)

## 2018-11-19 LAB — CBC WITH DIFFERENTIAL/PLATELET
Abs Immature Granulocytes: 0.06 10*3/uL (ref 0.00–0.07)
Basophils Absolute: 0 10*3/uL (ref 0.0–0.1)
Basophils Relative: 0 %
Eosinophils Absolute: 0.1 10*3/uL (ref 0.0–0.5)
Eosinophils Relative: 1 %
HCT: 40.2 % (ref 39.0–52.0)
Hemoglobin: 12.9 g/dL — ABNORMAL LOW (ref 13.0–17.0)
Immature Granulocytes: 1 %
Lymphocytes Relative: 13 %
Lymphs Abs: 1 10*3/uL (ref 0.7–4.0)
MCH: 32 pg (ref 26.0–34.0)
MCHC: 32.1 g/dL (ref 30.0–36.0)
MCV: 99.8 fL (ref 80.0–100.0)
MONO ABS: 0.5 10*3/uL (ref 0.1–1.0)
Monocytes Relative: 7 %
Neutro Abs: 5.8 10*3/uL (ref 1.7–7.7)
Neutrophils Relative %: 78 %
Platelets: 163 10*3/uL (ref 150–400)
RBC: 4.03 MIL/uL — ABNORMAL LOW (ref 4.22–5.81)
RDW: 13 % (ref 11.5–15.5)
WBC: 7.4 10*3/uL (ref 4.0–10.5)
nRBC: 0.3 % — ABNORMAL HIGH (ref 0.0–0.2)

## 2018-11-19 MED ORDER — HALOPERIDOL LACTATE 5 MG/ML IJ SOLN
5.0000 mg | Freq: Once | INTRAMUSCULAR | Status: AC
  Administered 2018-11-19: 5 mg via INTRAVENOUS
  Filled 2018-11-19: qty 1

## 2018-11-19 MED ORDER — SODIUM CHLORIDE 0.9 % IV SOLN: INTRAVENOUS | Status: DC | PRN

## 2018-11-19 NOTE — Progress Notes (Signed)
Patient's hgb continues to rise, up to 12.9 today. Xarelto being resumed today.No plans for EGD at this point.  If patient has evidence for recurrent GI bleeding or hgb declines then will certainly reconsider EGD. Call for questions. Thanks

## 2018-11-19 NOTE — Progress Notes (Signed)
Patient had 6 beat run of VTach.  Patient sitting in bed attempting to sit up and get out of bed.  Patient sitting back relaxed in bed.  Denies complaints.  Dr. Blake Divine on unit and notified.  Labs to be added.  Continue with d/c telemetry order.

## 2018-11-19 NOTE — Progress Notes (Signed)
PROGRESS NOTE    Tony Reed  UJW:[id number] DOB: 24-Nov-1925 DOA: 11/14/2018 PCP: Joaquim Namuncan, Graham S, MD    Brief Narrative: Tony Reed is a 85 y.o. male with history of CAD status post PCI, diastolic CHF, atrial fibrillation, dementia, chronic kidney disease, chronic anemia was brought to the ER after patient was found to have increasing confusion.  As per the family patient has not been eating well for the last 5 to 6 days.  Patient has become more confused and was taken to the primary care office and was referred to the ER for further work-up. Over the last few days patient also has noticed to have some dark stools.  Does not take any NSAIDs but patient is on Xarelto for A. Fib.   In the ER patient has periods of confusion.  Able to move all extremities follows commands.  CT head was unremarkable.  Chest x-ray shows possibility of pneumonia.  UA is unremarkable.  Patient was afebrile.  Stool for occult blood was positive.  Hemoglobin is around 15.  Creatinine has increased from 1.25 two months ago to 2.5.  Sodium is 159 WBC count is 12.5.  Patient was given fluid bolus due to dehydration and started on D5W for hypernatremia with renal failure.  Protonix started for GI bleed.  Patient admitted for acute encephalopathy with acute renal failure hyponatremia GI bleed and pneumonia.   Assessment & Plan:   Principal Problem:   ARF (acute renal failure) (HCC) Active Problems:   Atrial fibrillation (HCC)   Chronic heart failure with preserved ejection fraction (HCC)   Hypernatremia   Pneumonia   Acute GI bleeding   Malnutrition of moderate degree   Heme positive stool   Acute on chronic kidney disease stage III ,with hypernatremia probably secondary to dehydration and poor oral intake. Sodium has improved from 159-156 to 153 to 152 to 146 to 142.  And creatinine has improved from 2.58 to 1.7 to 1.4 to 1.1 to 1.05. Lactic acid within normal limits. Urine analysis is negative for  infection.    Chronic Atrial fibrillation, Rate controlled Since patient did not have any further drop in hemoglobin and there are no active signs of bleeding after discussion with GI we will go ahead and resume Xarelto and watch for any drop in hemoglobin.  Family in agreement. Repeat H&H is back to baseline.    Guaiac positive stools possibly blood loss from GI tract H&H stable. Resume PPI.  GI consulted and recommendations given no plans for EGD at this time.  But if his hemoglobin drops while on Xarelto then GI will proceed with EGD in the future. Continue to monitor.   Hypokalemia replaced.    Acute metabolic encephalopathy probably secondary to hypernatremia and AKI Improving, daughter feels patient is almost back to baseline.  Continue to monitor. Overnight patient had some restlessness and he was trying to get out of bed he was given a dose of anxiolytic and patient is sound asleep at this time.  History of chronic diastolic heart failure Appears euvolemic.   Community acquired pneumonia:  Day 5 of IV antibiotics. D/c antibiotics after today's dose.   Disposition:  Pt has advanced dementia, lives alone, has malnutrition, palliative care consulted for goals of care. Family hopeful that he will be able to function after short term rehab.  PT consulted and plan for SNF are in process.   DVT prophylaxis: SCDs Code Status: DNR Family Communication: discussed with daughter at bedside on 11/17/2018 Disposition  Plan: Pending clinical improvement  Consultants:   Palliative care consult  Gastroenterology consult.   Procedures: None Antimicrobials: rocephin and zithromax since admission. Till 11/19/2018.   Subjective: .no new complaints.  Vitals:   11/18/18 0607 11/18/18 1239 11/18/18 2204 11/19/18 0508  BP: (!) 155/84 (!) 147/88 (!) 147/87 117/77  Pulse: 76 69 82 84  Resp: 19 16 19 18   Temp: 98 F (36.7 C) 97.8 F (36.6 C) 98.3 F (36.8 C) 97.8 F (36.6 C)    TempSrc: Oral  Oral Oral  SpO2: 98% 97% 100% 92%  Weight:      Height:        Intake/Output Summary (Last 24 hours) at 11/19/2018 6060 Last data filed at 11/19/2018 0500 Gross per 24 hour  Intake 1070 ml  Output 350 ml  Net 720 ml   Filed Weights   11/15/18 1047  Weight: 68.9 kg    Examination:  General exam: Alert and comfortable, no agitation or distress noted Respiratory system: Clear to auscultation bilaterally no wheezing or rhonchi Cardiovascular system: S1 & S2 heard, IRREGULAR.  Gastrointestinal system: Abdomen is soft, nontender, nondistended with good bowel sounds Central nervous system: Pleasantly confused.  Able to move all extremities. Extremities: Symmetric 5 x 5 power.  No pedal edema Skin: No rashes, lesions or ulcers Psychiatry: Pleasantly confused    Data Reviewed: I have personally reviewed following labs and imaging studies  CBC: Recent Labs  Lab 11/14/18 1730  11/15/18 1056 11/16/18 0439 11/17/18 0407 11/18/18 0555 11/19/18 0437  WBC 12.5*   < > 11.7* 8.9 7.9 7.0 7.4  NEUTROABS 11.2*  --   --  7.6 6.6 5.5 5.8  HGB 15.8   < > 12.6* 10.8* 12.7* 11.7* 12.9*  HCT 52.9*   < > 42.6 36.9* 42.2 39.0 40.2  MCV 103.1*   < > 104.9* 107.9* 105.0* 106.0* 99.8  PLT 308   < > 202 167 163 163 163   < > = values in this interval not displayed.   Basic Metabolic Panel: Recent Labs  Lab 11/15/18 0500 11/16/18 0439 11/17/18 0407 11/18/18 0555 11/19/18 0437  NA 156* 153* 152* 146* 142  K 3.6 3.3* 3.8 3.5 4.5  CL 126* 122* 123* 115* 111  CO2 23 26 25 23 24   GLUCOSE 147* 112* 108* 102* 102*  BUN 70* 57* 39* 28* 19  CREATININE 2.11* 1.70* 1.46* 1.19 1.05  CALCIUM 8.0* 8.1* 8.7* 8.3* 8.3*   GFR: Estimated Creatinine Clearance: 45.6 mL/min (by C-G formula based on SCr of 1.05 mg/dL). Liver Function Tests: Recent Labs  Lab 11/14/18 1730  AST 53*  ALT 31  ALKPHOS 82  BILITOT 1.2  PROT 8.0  ALBUMIN 3.1*   No results for input(s): LIPASE, AMYLASE  in the last 168 hours. No results for input(s): AMMONIA in the last 168 hours. Coagulation Profile: No results for input(s): INR, PROTIME in the last 168 hours. Cardiac Enzymes: No results for input(s): CKTOTAL, CKMB, CKMBINDEX, TROPONINI in the last 168 hours. BNP (last 3 results) No results for input(s): PROBNP in the last 8760 hours. HbA1C: No results for input(s): HGBA1C in the last 72 hours. CBG: No results for input(s): GLUCAP in the last 168 hours. Lipid Profile: No results for input(s): CHOL, HDL, LDLCALC, TRIG, CHOLHDL, LDLDIRECT in the last 72 hours. Thyroid Function Tests: No results for input(s): TSH, T4TOTAL, FREET4, T3FREE, THYROIDAB in the last 72 hours. Anemia Panel: Recent Labs    11/17/18 0407  VITAMINB12 1,761*  Sepsis Labs: Recent Labs  Lab 11/14/18 1730 11/14/18 1904  LATICACIDVEN 2.3* 1.8    Recent Results (from the past 240 hour(s))  Urine culture     Status: None   Collection Time: 11/14/18  6:01 PM  Result Value Ref Range Status   Specimen Description   Final    URINE, CATHETERIZED Performed at [facility], [address] [place], [place] [address]    Special Requests   Final    NONE Performed at [facility], [address] [place], [place] [address]    Culture   Final    NO GROWTH Performed at [facility], [address], [place], [place] [address]    Report Status 11/16/2018 FINAL  Final  Blood culture (routine x 2)     Status: None (Preliminary result)   Collection Time: 11/14/18  8:14 PM  Result Value Ref Range Status   Specimen Description BLOOD RIGHT HAND  Final   Special Requests   Final    BOTTLES DRAWN AEROBIC AND ANAEROBIC Blood Culture results may not be optimal due to an inadequate volume of blood received in culture bottles Performed at [facility], [address], [place], [place] [address]    Culture NO GROWTH 4 DAYS  Final   Report Status PENDING   Incomplete  Blood culture (routine x 2)     Status: None (Preliminary result)   Collection Time: 11/14/18  8:40 PM  Result Value Ref Range Status   Specimen Description BLOOD LEFT WRIST  Final   Special Requests   Final    BOTTLES DRAWN AEROBIC AND ANAEROBIC Blood Culture adequate volume Performed at [facility], [address] [place], [place] [address]    Culture NO GROWTH 4 DAYS  Final   Report Status PENDING  Incomplete         Radiology Studies: No results found.      Scheduled Meds: . feeding supplement (ENSURE ENLIVE)  237 mL Oral BID BM  . mouth rinse  15 mL Mouth Rinse BID  . multivitamin with minerals  1 tablet Oral Daily  . nystatin   Topical TID  . pantoprazole (PROTONIX) IV  40 mg Intravenous Q12H  . rivaroxaban  15 mg Oral Q supper   Continuous Infusions: . sodium chloride    . cefTRIAXone (ROCEPHIN)  IV 1 g (11/18/18 0940)     LOS: 4 days    Time spent: 26 min    Kathlen Mody, MD [facility] Pager [phone] If 7PM-7AM, please contact night-coverage [email or web address] Password [facility] 11/19/2018, 9:37 AM

## 2018-11-20 DIAGNOSIS — E860 Dehydration: Secondary | ICD-10-CM

## 2018-11-20 LAB — CULTURE, BLOOD (ROUTINE X 2)
Culture: NO GROWTH
Culture: NO GROWTH
Special Requests: ADEQUATE

## 2018-11-20 MED ORDER — NYSTATIN 100000 UNIT/GM EX POWD: Freq: Three times a day (TID) | CUTANEOUS | 0 refills | Status: DC

## 2018-11-20 MED ORDER — PANTOPRAZOLE SODIUM 40 MG PO TBEC: 40.0000 mg | DELAYED_RELEASE_TABLET | Freq: Every day | ORAL | 0 refills | Status: DC

## 2018-11-20 MED ORDER — ENSURE ENLIVE PO LIQD: 237.0000 mL | Freq: Two times a day (BID) | ORAL | 12 refills | Status: DC

## 2018-11-20 MED ORDER — ADULT MULTIVITAMIN W/MINERALS CH: 1.0000 | ORAL_TABLET | Freq: Every day | ORAL | 0 refills | Status: DC

## 2018-11-20 NOTE — Progress Notes (Signed)
Physical Therapy Treatment Patient Details Name: Tony Reed MRN: [id number] DOB: 1926-06-08 Today's Date: 11/20/2018    History of Present Illness 85 yo male admitted with GI bleed, confusion, ARF. Hx of dementia, A fib, CAD, CHF    PT Comments    Pt sleeping on arrival.  Pt assisted to standing however attempting to return to supine quickly after.  Upon return to supine, pt restless and requesting bed rail down, observed BM on bed pad so provided bed pan.  Pt assisted with pericare and quickly returned to sleeping.    Follow Up Recommendations  SNF;Supervision/Assistance - 24 hour     Equipment Recommendations  Rolling walker with 5" wheels;3in1 (PT)    Recommendations for Other Services       Precautions / Restrictions Precautions Precautions: Fall    Mobility  Bed Mobility Overal bed mobility: Needs Assistance Bed Mobility: Supine to Sit     Supine to sit: Max assist;+2 for physical assistance Sit to supine: +2 for physical assistance;Mod assist   General bed mobility comments: pt sleeping on arrival, required assist for supine to sit however pt able to self assist more upon returning to bed  Transfers Overall transfer level: Needs assistance Equipment used: 2 person hand held assist Transfers: Sit to/from Stand Sit to Stand: Mod assist         General transfer comment: multimodal cues provided for technique, pt attempting to assist initially however posterior lean against bed and pt returned to sitting, pt not attempting to rise on second attempt and started to return head to supine position  Ambulation/Gait                 Stairs             Wheelchair Mobility    Modified Rankin (Stroke Patients Only)       Balance                                            Cognition Arousal/Alertness: Awake/alert Behavior During Therapy: Restless Overall Cognitive Status: No family/caregiver present to determine baseline  cognitive functioning                                 General Comments: pt sleeping on arrival and departure, restless part of session due to BM urgency (wanting to get OOB however utilized bed pan for safety)      Exercises      General Comments        Pertinent Vitals/Pain Pain Assessment: Faces Faces Pain Scale: No hurt    Home Living                      Prior Function            PT Goals (current goals can now be found in the care plan section) Progress towards PT goals: Progressing toward goals    Frequency    Min 2X/week      PT Plan Current plan remains appropriate    Co-evaluation              AM-PAC PT "6 Clicks" Mobility   Outcome Measure  Help needed turning from your back to your side while in a flat bed without using bedrails?: A Lot Help needed moving from lying  on your back to sitting on the side of a flat bed without using bedrails?: A Lot Help needed moving to and from a bed to a chair (including a wheelchair)?: A Lot Help needed standing up from a chair using your arms (e.g., wheelchair or bedside chair)?: A Lot Help needed to walk in hospital room?: A Lot Help needed climbing 3-5 steps with a railing? : Total 6 Click Score: 11    End of Session Equipment Utilized During Treatment: Gait belt;Oxygen Activity Tolerance: Patient limited by fatigue Patient left: in bed;with call bell/phone within reach;with bed alarm set   PT Visit Diagnosis: Unsteadiness on feet (R26.81);Difficulty in walking, not elsewhere classified (R26.2)     Time: 6503-5465 PT Time Calculation (min) (ACUTE ONLY): 17 min  Charges:  $Therapeutic Activity: 8-22 mins                     Zenovia Jarred, PT, DPT Acute Rehabilitation Services Office: [phone] Pager: [phone]  Sarajane Jews 11/20/2018, 12:35 PM

## 2018-11-20 NOTE — Clinical Social Work Note (Addendum)
Discussed bed offers with pt's daughter- she is visiting facilities to make selection. Aware pt ready for DC (will need insurance authorization)   Clinical Social Work Assessment  Patient Details  Name: Tony Reed MRN: [id number] Date of Birth: [birth year]  Date of referral:  11/20/18               Reason for consult:  Facility Placement                Permission sought to share information with:  Family Supports Permission granted to share information::  Yes, Verbal Permission Granted  Name::     daughters Tony RiegerLaura and Tony BallRobin  Agency::     Relationship::     Contact Information:     Housing/Transportation Living arrangements for the past 2 months:  Single Family Home Source of Information:  Patient, Adult Children, Medical Team Patient Interpreter Needed:  None Criminal Activity/Legal Involvement Pertinent to Current Situation/Hospitalization:  No - Comment as needed Significant Relationships:  Adult Children Lives with:  Self Do you feel safe going back to the place where you live?  Yes Need for family participation in patient care:  Yes (Comment)(pt with dementia)  Care giving concerns:  Pt admitted from home where he resides independently with much help from nearby children. Pt has 4 living adult children. Walks with walker or independently at baseline but due to dementia needs assistance with meals, medications, prompting for ADLs, household affairs, etc. Pt's wife died last year and since then family has been assisting him more. Daughter reports often the children stay with him or lock his doors as he will wander at night. Generally knows his whereabouts and family at baseline but sometimes thinks his daughters are his deceased wife. Not typically oriented to situation. Daughter reports pt began acting "sedated and confused, not eating" last week prompting family to being him to PCP who directed him to ED visit.  Pt admitted for GI bleed, pneumonia, acute on chronic renal  disease.    Social Worker assessment / plan:  CSW consulted for assistance with SNF placement. Pt not oriented but pleasant as CSW and daughter Tony Reed(Tony Reed) discussed disposition. Reports pt has never been to rehab before but family familiar with short term therapy goals as pt's deceased wife did SNF rehab in the past.  Feels pt would benefit has his level of mobility PTA was quite independent. They are concerned about his wandering behaviors and feel he would need locked unit. CSW in agreement (pt not ambulating much presently but if progresses with therapy at SNF, wandering would be safety risk for him and facility). Made referrals and left voicemail with pt's other daughter Tony Reed(Laura- Tony BallRobin reports she would be relative to decide facility) to discuss bed offers. [facility] once [facility]. Daughter aware locked unit rehab facilities will be very limited.  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 When he interjected in interaction, was to joke appropriately or ask off-topic questions. Daughter reports this is baseline mentation. He did follow commands and respond appropriately to basic questions  Emotional Assessment Appearance:  Appears stated age Attitude/Demeanor/Rapport:  (pleasant, not oriented) Affect (typically observed):  Calm, Happy Orientation:  Oriented  to Self, Oriented to Place Alcohol / Substance use:  Not Applicable Psych involvement (Current and /or in the community):  No (Comment)  Discharge Needs  Concerns to be addressed:  Discharge Planning Concerns Readmission  within the last 30 days:  No Current discharge risk:  Dependent with Mobility, Cognitively Impaired Barriers to Discharge:  Insurance Authorization   Nelwyn Salisbury, LCSW 11/20/2018, 10:44 AM [phone]

## 2018-11-20 NOTE — Discharge Summary (Signed)
Physician Discharge Summary  Tony Reed ZOX:[id number] DOB: [birth year] DOA: 11/14/2018  PCP: Tony Nam, MD  Admit date: 11/14/2018 Discharge date: 11/20/2018  Admitted From: Home. Disposition:  SNF.   Recommendations for Outpatient Follow-up:  1. Follow up with PCP in 1-2 weeks 2. Please obtain BMP/CBC in one week Please follow up with gastroenterology as needed.   Discharge Condition: STABLE.  CODE STATUS: DNR  Diet recommendation: Heart Healthy  Brief/Interim Summary: Tony Reed a 85 y.o.malewithhistory of CAD status post PCI, diastolic CHF, atrial fibrillation, dementia, chronic kidney disease, chronic anemia was brought to the ER after patient was found to have increasing confusion. As per the family patient has not been eating well for the last 5 to 6 days. Patient has become more confused and was taken to the primary care office and was referred to the ER for further work-up. Over the last few days patient also has noticed to have some dark stools. Does not take any NSAIDs but patient is on Xarelto for A. Fib.  In the ER patient has periods of confusion. Able to move all extremities follows commands. CT head was unremarkable. Chest x-ray shows possibility of pneumonia. UA is unremarkable. Patient was afebrile. Stool for occult blood was positive. Hemoglobin is around 15. Creatinine has increased from 1.25 twomonths ago to 2.5. Sodium is 159 WBC count is 12.5. Patient was given fluid bolus due to dehydration and started on D5W for hypernatremia with renal failure. Protonix started for GI bleed. Patient admitted for acute encephalopathy with acute renal failure hyponatremia GI bleed and pneumonia.  Discharge Diagnoses:  Principal Problem:   ARF (acute renal failure) (HCC) Active Problems:   Atrial fibrillation (HCC)   Chronic heart failure with preserved ejection fraction (HCC)   Hypernatremia   Pneumonia   Acute GI bleeding    Malnutrition of moderate degree   Heme positive stool  Acute on chronic kidney disease stage III ,with hypernatremia probably secondary to dehydration and poor oral intake. Sodium has improved from 159-156 to 153 to 152 to 146 to 142.  And creatinine has improved from 2.58 to 1.7 to 1.4 to 1.1 to 1.05 Continue with IV fluids at this time. Lactic acid within normal limits. Urine analysis is negative for infection.   Chronic Atrial fibrillation, Rate controlled Since patient did not have any further drop in hemoglobin and there are no active signs of bleeding after discussion with GI we will go ahead and resume Xarelto and watch for any drop in hemoglobin.  Family in agreement.   Guaiac positive stools possibly blood loss from GI tract H&H stable. Resume PPI.  GI consulted and recommendations given no plans for EGD at this time.  But if his hemoglobin drops while on Xarelto then GI will proceed with EGD in the future. Continue to monitor.   Hypokalemia replaced.    Acute metabolic encephalopathy probably secondary to hypernatremia and AKI Improving, daughter feels patient is almost back to baseline.     History of chronic diastolic heart failure Appears euvolemic.   Community acquired pneumonia:  COMPLETED 5 days of IV antibiotics.     Discharge Instructions  Discharge Instructions    Diet - low sodium heart healthy   Complete by:  As directed    Discharge instructions   Complete by:  As directed    Please follow up with PCP in one week.     Allergies as of 11/20/2018      Reactions   Namenda [memantine  Hcl] Other (See Comments)   diarrhea   Naproxen Other (See Comments)   ABD PAIN, DIARRHEA   Penicillins Other (See Comments)   Was able to tolerate keflex 08/2017      Medication List    STOP taking these medications   ramipril 2.5 MG capsule Commonly known as:  ALTACE     TAKE these medications   atorvastatin 80 MG tablet Commonly known as:   LIPITOR TAKE 1 TABLET BY MOUTH EVERY DAY   feeding supplement (ENSURE ENLIVE) Liqd Take 237 mLs by mouth 2 (two) times daily between meals.   ferrous sulfate 325 (65 FE) MG tablet Take 325 mg by mouth daily with breakfast.   furosemide 40 MG tablet Commonly known as:  LASIX TAKE 1 TABLET(40 MG) BY MOUTH DAILY   multivitamin with minerals Tabs tablet Take 1 tablet by mouth daily.   nystatin powder Commonly known as:  MYCOSTATIN/NYSTOP Apply topically 3 (three) times daily.   pantoprazole 40 MG tablet Commonly known as:  PROTONIX Take 1 tablet (40 mg total) by mouth daily.   potassium chloride SA 20 MEQ tablet Commonly known as:  K-DUR,KLOR-CON Take 1 tablet (20 mEq total) by mouth daily.   vitamin B-12 1000 MCG tablet Commonly known as:  CYANOCOBALAMIN Take 1 tablet (1,000 mcg total) by mouth daily.   XARELTO 15 MG Tabs tablet Generic drug:  Rivaroxaban TAKE 1 TABLET(15 MG) BY MOUTH DAILY WITH SUPPER      Follow-up Information    Tony Nam, MD. Schedule an appointment as soon as possible for a visit in 1 week(s).   Specialty:  Family Medicine Contact information: [address] [place] [address] [phone]        Yvonne Kendall, MD .   Specialty:  Cardiology Contact information: [address] [place] [address] [phone]          Allergies  Allergen Reactions  . Namenda [Memantine Hcl] Other (See Comments)    diarrhea  . Naproxen Other (See Comments)    ABD PAIN, DIARRHEA  . Penicillins Other (See Comments)    Was able to tolerate keflex 08/2017    Consultations:  Gastroenterology.    Procedures/Studies: Ct Head Wo Contrast  Result Date: 11/14/2018 CLINICAL DATA:  Altered level of consciousness. EXAM: CT HEAD WITHOUT CONTRAST TECHNIQUE: Contiguous axial images were obtained from the base of the skull through the vertex without intravenous contrast. COMPARISON:  09/04/2017 FINDINGS: Brain: There is no  evidence for acute hemorrhage, hydrocephalus, mass lesion, or abnormal extra-axial fluid collection. No definite CT evidence for acute infarction. Diffuse loss of parenchymal volume is consistent with atrophy. Patchy low attenuation in the deep hemispheric and periventricular white matter is nonspecific, but likely reflects chronic microvascular ischemic demyelination. Vascular: No hyperdense vessel or unexpected calcification. Skull: No evidence for fracture. No worrisome lytic or sclerotic lesion. Sinuses/Orbits: The visualized paranasal sinuses and right mastoid air cells are clear. Fluid noted in posterior left mastoid air cells. Visualized portions of the globes and intraorbital fat are unremarkable. Other: None. IMPRESSION: 1. No acute intracranial abnormality. 2. Atrophy with chronic small vessel white matter ischemic disease. Electronically Signed   By: Kennith Center M.D.   On: 11/14/2018 18:31   Dg Chest Portable 1 View  Result Date: 11/14/2018 CLINICAL DATA:  Dementia.  Decreased appetite. EXAM: PORTABLE CHEST 1 VIEW COMPARISON:  09/04/2017 FINDINGS: Right lung is clear. Patchy airspace disease noted left base. Interstitial markings are diffusely coarsened with chronic  features. The cardio pericardial silhouette is enlarged. Bones are diffusely demineralized. Face obscures the lung apices. IMPRESSION: Probable emphysema with left base patchy airspace disease suggesting pneumonia. Electronically Signed   By: Kennith CenterEric  Mansell M.D.   On: 11/14/2018 17:56       Subjective:  No new complaints.  Discharge Exam: Vitals:   11/19/18 2046 11/20/18 0537  BP:  119/77  Pulse: 70 76  Resp:  18  Temp:  98.1 F (36.7 C)  SpO2: 95%    Vitals:   11/19/18 1326 11/19/18 2039 11/19/18 2046 11/20/18 0537  BP: 133/77 132/87  119/77  Pulse: 81 77 70 76  Resp: 18 20  18   Temp: 97.8 F (36.6 C) 98.3 F (36.8 C)  98.1 F (36.7 C)  TempSrc: Oral     SpO2: 97%  95%   Weight:      Height:         General: Pt is alert, awake, not in acute distress Cardiovascular: RRR, S1/S2 +, no rubs, no gallops Respiratory: CTA bilaterally, no wheezing, no rhonchi Abdominal: Soft, NT, ND, bowel sounds + Extremities: no edema, no cyanosis    The results of significant diagnostics from this hospitalization (including imaging, microbiology, ancillary and laboratory) are listed below for reference.     Microbiology: Recent Results (from the past 240 hour(s))  Urine culture     Status: None   Collection Time: 11/14/18  6:01 PM  Result Value Ref Range Status   Specimen Description   Final    URINE, CATHETERIZED Performed at [facility], [address] [place], [place] [address]    Special Requests   Final    NONE Performed at [facility], [address] [place], [place] [address]    Culture   Final    NO GROWTH Performed at [facility], [address] [place], [place] [address]    Report Status 11/16/2018 FINAL  Final  Blood culture (routine x 2)     Status: None   Collection Time: 11/14/18  8:14 PM  Result Value Ref Range Status   Specimen Description   Final    BLOOD RIGHT HAND Performed at [facility], [address] [place], [place] [address]    Special Requests   Final    BOTTLES DRAWN AEROBIC AND ANAEROBIC Blood Culture results may not be optimal due to an inadequate volume of blood received in culture bottles Performed at [facility], [address] [place], [place] [address]    Culture   Final    NO GROWTH 5 DAYS Performed at [facility], [address] [place], [place] [address]    Report Status 11/20/2018 FINAL  Final  Blood culture (routine x 2)     Status: None   Collection Time: 11/14/18  8:40 PM  Result Value Ref Range Status   Specimen Description   Final    BLOOD LEFT WRIST Performed at [facility], [address] [place], [place]  [address]    Special Requests   Final    BOTTLES DRAWN AEROBIC AND ANAEROBIC Blood Culture adequate volume Performed at [facility], [address] [place], [place] [address]    Culture   Final    NO GROWTH 5 DAYS Performed at [facility], [address] [place], [place] [address]    Report Status 11/20/2018 FINAL  Final     Labs: BNP (last  3 results) No results for input(s): BNP in the last 8760 hours. Basic Metabolic Panel: Recent Labs  Lab 11/15/18 0500 11/16/18 0439 11/17/18 0407 11/18/18 0555 11/19/18 0437  NA 156* 153* 152* 146* 142  K 3.6 3.3* 3.8 3.5 4.5  CL 126* 122* 123* 115* 111  CO2 23 26 25 23 24   GLUCOSE 147* 112* 108* 102* 102*  BUN 70* 57* 39* 28* 19  CREATININE 2.11* 1.70* 1.46* 1.19 1.05  CALCIUM 8.0* 8.1* 8.7* 8.3* 8.3*   Liver Function Tests: Recent Labs  Lab 11/14/18 1730  AST 53*  ALT 31  ALKPHOS 82  BILITOT 1.2  PROT 8.0  ALBUMIN 3.1*   No results for input(s): LIPASE, AMYLASE in the last 168 hours. No results for input(s): AMMONIA in the last 168 hours. CBC: Recent Labs  Lab 11/14/18 1730  11/15/18 1056 11/16/18 0439 11/17/18 0407 11/18/18 0555 11/19/18 0437  WBC 12.5*   < > 11.7* 8.9 7.9 7.0 7.4  NEUTROABS 11.2*  --   --  7.6 6.6 5.5 5.8  HGB 15.8   < > 12.6* 10.8* 12.7* 11.7* 12.9*  HCT 52.9*   < > 42.6 36.9* 42.2 39.0 40.2  MCV 103.1*   < > 104.9* 107.9* 105.0* 106.0* 99.8  PLT 308   < > 202 167 163 163 163   < > = values in this interval not displayed.   Cardiac Enzymes: No results for input(s): CKTOTAL, CKMB, CKMBINDEX, TROPONINI in the last 168 hours. BNP: Invalid input(s): POCBNP CBG: No results for input(s): GLUCAP in the last 168 hours. D-Dimer No results for input(s): DDIMER in the last 72 hours. Hgb A1c No results for input(s): HGBA1C in the last 72 hours. Lipid Profile No results for input(s): CHOL, HDL, LDLCALC, TRIG, CHOLHDL, LDLDIRECT in the last 72 hours. Thyroid function  studies No results for input(s): TSH, T4TOTAL, T3FREE, THYROIDAB in the last 72 hours.  Invalid input(s): FREET3 Anemia work up No results for input(s): VITAMINB12, FOLATE, FERRITIN, TIBC, IRON, RETICCTPCT in the last 72 hours. Urinalysis    Component Value Date/Time   COLORURINE YELLOW 11/14/2018 1801   APPEARANCEUR CLEAR 11/14/2018 1801   LABSPEC 1.014 11/14/2018 1801   PHURINE 5.0 11/14/2018 1801   GLUCOSEU NEGATIVE 11/14/2018 1801   HGBUR MODERATE (A) 11/14/2018 1801   HGBUR large 01/29/2009 1157   BILIRUBINUR NEGATIVE 11/14/2018 1801   BILIRUBINUR neg 01/18/2013 1635   KETONESUR NEGATIVE 11/14/2018 1801   PROTEINUR NEGATIVE 11/14/2018 1801   UROBILINOGEN 0.2 01/18/2013 1635   UROBILINOGEN 0.2 01/29/2009 1157   NITRITE NEGATIVE 11/14/2018 1801   LEUKOCYTESUR NEGATIVE 11/14/2018 1801   Sepsis Labs Invalid input(s): PROCALCITONIN,  WBC,  LACTICIDVEN Microbiology Recent Results (from the past 240 hour(s))  Urine culture     Status: None   Collection Time: 11/14/18  6:01 PM  Result Value Ref Range Status   Specimen Description   Final    URINE, CATHETERIZED Performed at [facility], [address], [place], [place] [address]    Special Requests   Final    NONE Performed at [facility], [address], [place], [place] [address]    Culture   Final    NO GROWTH Performed at [facility], [address] [place], [place] [address]    Report Status 11/16/2018 FINAL  Final  Blood culture (routine x 2)     Status: None   Collection Time: 11/14/18  8:14 PM  Result Value Ref Range Status  Specimen Description   Final    BLOOD RIGHT HAND Performed at [facility], [address] [place], [place] [address]    Special Requests   Final    BOTTLES DRAWN AEROBIC AND ANAEROBIC Blood Culture results may not be optimal due to an inadequate volume of blood received in culture bottles Performed at [facility], [address] [place], [place] [address]    Culture   Final    NO GROWTH 5 DAYS Performed at [facility], [address] [place], [place] [address]    Report Status 11/20/2018 FINAL  Final  Blood culture (routine x 2)     Status: None   Collection Time: 11/14/18  8:40 PM  Result Value Ref Range Status   Specimen Description   Final    BLOOD LEFT WRIST Performed at [facility], [address] [place], [place] [address]    Special Requests   Final    BOTTLES DRAWN AEROBIC AND ANAEROBIC Blood Culture adequate volume Performed at [facility], [address] [place], [place] [address]    Culture   Final    NO GROWTH 5 DAYS Performed at [facility], [address] [place], [place] [address]    Report Status 11/20/2018 FINAL  Final     Time coordinating discharge: 35  minutes  SIGNED:   Kathlen ModyVijaya Skipper Dacosta, MD  [facility] 11/20/2018, 10:06 AM Pager   If 7PM-7AM, please contact night-coverage [email or web address] Password [email or web address]

## 2018-11-21 MED ORDER — PANTOPRAZOLE SODIUM 40 MG PO TBEC
40.0000 mg | DELAYED_RELEASE_TABLET | Freq: Two times a day (BID) | ORAL | Status: DC
  Administered 2018-11-21 – 2018-11-23 (×5): 40 mg via ORAL
  Filled 2018-11-21 (×5): qty 1

## 2018-11-21 NOTE — Care Management Note (Signed)
Case Management Note  Patient Details  Name: Tony Reed MRN: [id number] Date of Birth: Nov 04, 1925  Subjective/Objective:                  discharged  Action/Plan: snf discharge see csw notes  Expected Discharge Date:  11/20/18               Expected Discharge Plan:  Skilled Nursing Facility  In-House Referral:  Clinical Social Work  Discharge planning Services  CM Consult  Post Acute Care Choice:    Choice offered to:     DME Arranged:    DME Agency:     HH Arranged:    HH Agency:     Status of Service:  Completed, signed off  If discussed at [employer], dates discussed:    Additional Comments:  Golda Acre, RN 11/21/2018, 8:28 AM

## 2018-11-21 NOTE — Progress Notes (Signed)
ANTICOAGULATION CONSULT NOTE   Pharmacy Consult for xarelto Indication: hx atrial fibrillation  Allergies  Allergen Reactions  . Namenda [Memantine Hcl] Other (See Comments)    diarrhea  . Naproxen Other (See Comments)    ABD PAIN, DIARRHEA  . Penicillins Other (See Comments)    Was able to tolerate keflex 08/2017    Patient Measurements: Height: 5\' 11"  (180.3 cm) Weight: 151 lb 14.4 oz (68.9 kg) IBW/kg (Calculated) : 75.3 Heparin Dosing Weight:   Vital Signs: Temp: 98.3 F (36.8 C) (02/04 0449) Temp Source: Oral (02/04 0449) BP: 141/77 (02/04 0449) Pulse Rate: 84 (02/04 0449)  Labs: Recent Labs    11/19/18 0437  HGB 12.9*  HCT 40.2  PLT 163  CREATININE 1.05    Estimated Creatinine Clearance: 45.6 mL/min (by C-G formula based on SCr of 1.05 mg/dL).   Medications:  - on xarelto 15 mg daily PTA  Assessment: Patient is a 85 y.o M with hx Afib on xarelto PTA presented to the ED on 1/28 with c/o fatigue and dark stools with hemeoccult positive on admission. Stools are no longer dark and hgb remains stable. GI recommends to hold off on EGD for now and to resume xarelto back on 2/1.  - scr trending down (1.05, crcl~45 ml/min using total body weight)   Plan:  - continue home xarelto dose of 15 mg PO daily - monitor renal function and s/s bleeding  Loralee Pacas, PharmD, BCPS Pager: [phone] 11/21/2018,8:04 AM

## 2018-11-21 NOTE — Progress Notes (Signed)
PHARMACIST - PHYSICIAN COMMUNICATION  DR:   Macky Lower  CONCERNING: IV to Oral Route Change Policy  RECOMMENDATION: This patient is receiving Protonix by the intravenous route.  Based on criteria approved by the Pharmacy and Therapeutics Committee, the intravenous medication(s) is/are being converted to the equivalent oral dose form(s).   DESCRIPTION: These criteria include:  The patient is eating (either orally or via tube) and/or has been taking other orally administered medications for a least 24 hours  The patient has no evidence of active gastrointestinal bleeding or impaired GI absorption (gastrectomy, short bowel, patient on TNA or NPO).  If you have questions about this conversion, please contact the Pharmacy Department  []   [phone] )  [facility] []   [phone] )  [facility] []   [phone] )  [facility] []   [phone] )  [facility] [x]   [phone] )  [facility]   Loralee Pacas, PharmD, BCPS 11/21/2018 8:03 AM

## 2018-11-21 NOTE — Progress Notes (Signed)
Nutrition Follow-up  DOCUMENTATION CODES:   Non-severe (moderate) malnutrition in context of chronic illness  INTERVENTION:    Ensure Enlive po BID, each supplement provides 350 kcal and 20 grams of protein  Magic cup BID with meals, each supplement provides 290 kcal and 9 grams of protein  MVI daily  NUTRITION DIAGNOSIS:   Moderate Malnutrition related to chronic illness(dementia) as evidenced by moderate fat depletion, severe muscle depletion, mild muscle depletion.  Ongoing  GOAL:   Patient will meet greater than or equal to 90% of their needs  Progressing   MONITOR:   PO intake, Supplement acceptance, Diet advancement, Weight trends, Labs  REASON FOR ASSESSMENT:   Malnutrition Screening Tool    ASSESSMENT:   85 y.o. male with history of CAD s/p PCI, CHF, atrial fibrillation, dementia, CKD, and chronic anemia. He was taken to the ED after he was found to have increasing confusion. In the ED, family reported that patient had not been eating well for 5-6 days. They also reported that he had been experiencing a cough and diarrhea.   Daughter at bedside states pt's appetite has progressed back to baseline. Meal completions charted as 25-100% for the pts last eight meals. RD observed pt eating pudding and soup from his lunch tray. Daughter would like to continue Ensure as pt has been compliant with them BID.   A weight has not been obtained since 1/29. Plan for discharge to SNF rehab once insurance is authorized.   Medications reviewed and include: MVI with minerals Labs reviewed.   Diet Order:   Diet Order            Diet - low sodium heart healthy        Diet full liquid Room service appropriate? Yes; Fluid consistency: Thin  Diet effective now              EDUCATION NEEDS:   No education needs have been identified at this time  Skin:  Skin Assessment: Reviewed RN Assessment  Last BM:  2/4  Height:   Ht Readings from Last 1 Encounters:  11/15/18  5\' 11"  (1.803 m)    Weight:   Wt Readings from Last 1 Encounters:  11/15/18 68.9 kg    Ideal Body Weight:  78.18 kg  BMI:  Body mass index is 21.19 kg/m.  Estimated Nutritional Needs:   Kcal:  1525-1725 kcal  Protein:  60-70 grams  Fluid:  >/= 1.8 L/day   Vanessa Kickarly Umberto Pavek RD, LDN Clinical Nutrition Pager # - [phone]

## 2018-11-21 NOTE — Progress Notes (Signed)
PROGRESS NOTE    Tony Reed  POI:[id number] DOB: 10/18/1926 DOA: 11/14/2018 PCP: Joaquim Nam, MD    Brief Narrative: Tony Reed is a 85 y.o. male with history of CAD status post PCI, diastolic CHF, atrial fibrillation, dementia, chronic kidney disease, chronic anemia was brought to the ER after patient was found to have increasing confusion.  As per the family patient has not been eating well for the last 5 to 6 days.  Patient has become more confused and was taken to the primary care office and was referred to the ER for further work-up. Over the last few days patient also has noticed to have some dark stools.  Does not take any NSAIDs but patient is on Xarelto for A. Fib.   In the ER patient had periods of confusion.  Able to move all extremities follows commands.  CT head was unremarkable.  Chest x-ray shows possibility of pneumonia.  UA is unremarkable.  Patient was afebrile.  Stool for occult blood was positive.  Hemoglobin is around 15.  Creatinine has increased from 1.25 two months ago to 2.5.  Sodium is 159 WBC count is 12.5.  Patient was given fluid bolus due to dehydration and started on D5W for hypernatremia with renal failure.  Protonix started for GI bleed.  Patient admitted for acute encephalopathy with acute renal failure hyponatremia GI bleed and pneumonia.   Assessment & Plan:   Principal Problem:   ARF (acute renal failure) (HCC) Active Problems:   Atrial fibrillation (HCC)   Chronic heart failure with preserved ejection fraction (HCC)   Hypernatremia   Pneumonia   Acute GI bleeding   Malnutrition of moderate degree   Heme positive stool   Acute on chronic kidney disease stage III ,with hypernatremia probably secondary to dehydration and poor oral intake. Sodium has improved from 159-156 to 153 to 152 to 146 to 142.  And creatinine has improved from 2.58 to 1.7 to 1.4 to 1.1 to 1.05. Lactic acid within normal limits. Urine analysis is negative for  infection. 11/21/2018: -Sodium last checked on 11/19/2018 was 142.  Ordered labs for a.m. -Creatinine also ordered for a.m.  Chronic Atrial fibrillation: Rate controlled Since patient did not have any further drop in hemoglobin and there are no active signs of bleeding after discussion with GI Xarelto was resumed.  11/21/2018: -Hemoglobin checked on 11/19/2018 was 12.9.  Ordered labs for a.m.  Guaiac positive stools possibly blood loss from GI tract H&H stable. Resume PPI.  GI consulted and recommendations given no plans for EGD at this time.  But if his hemoglobin drops while on Xarelto then GI will proceed with EGD in the future. Continue to monitor.   Hypokalemia: Potassium replaced.  -Ordered labs for a.m.  Acute metabolic encephalopathy probably secondary to hypernatremia and AKI -At this time oriented x1.  Baseline?  Patient has advanced dementia.  Not as agitated as before. -Continue to monitor.  History of chronic diastolic heart failure Appears euvolemic.   Community acquired pneumonia:  -Completed 5 days of antibiotics.  Afebrile without any leukocytosis at this time.  Disposition:  Pt has advanced dementia, lives alone, has malnutrition, palliative care consulted for goals of care.   PT consulted and awaiting insurance authorization for SNF.   DVT prophylaxis: SCDs Code Status: DNR Family Communication: No family at bedside at this time Disposition Plan: Awaiting insurance authorization for SNF  Consultants:   Palliative care consult  Gastroenterology consult.   Procedures: None Antimicrobials: rocephin  and zithromax since admission. Till 11/19/2018.   Subjective: .no new complaints.  Vitals:   11/20/18 0537 11/20/18 1258 11/20/18 2103 11/21/18 0449  BP: 119/77 122/77 123/65 (!) 141/77  Pulse: 76 76 75 84  Resp: 18 20 20 18   Temp: 98.1 F (36.7 C) 98.2 F (36.8 C) 98.6 F (37 C) 98.3 F (36.8 C)  TempSrc:  Oral Oral Oral  SpO2:  96% 98% 97%  Weight:        Height:        Intake/Output Summary (Last 24 hours) at 11/21/2018 1416 Last data filed at 11/21/2018 0800 Gross per 24 hour  Intake 180 ml  Output 100 ml  Net 80 ml   Filed Weights   11/15/18 1047  Weight: 68.9 kg    Examination:  General exam: Oriented x1, no agitation or distress noted Respiratory system: Clear to auscultation bilaterally no wheezing or rhonchi Cardiovascular system: S1 & S2 heard, IRREGULAR.  Gastrointestinal system: Abdomen is soft, nontender, nondistended with good bowel sounds Central nervous system: Pleasantly confused.  Able to move all extremities. Extremities: Symmetric 5 x 5 power.  No pedal edema Skin: No rashes, lesions or ulcers Psychiatry: Pleasantly confused    Data Reviewed: I have personally reviewed following labs and imaging studies  CBC: Recent Labs  Lab 11/14/18 1730  11/15/18 1056 11/16/18 0439 11/17/18 0407 11/18/18 0555 11/19/18 0437  WBC 12.5*   < > 11.7* 8.9 7.9 7.0 7.4  NEUTROABS 11.2*  --   --  7.6 6.6 5.5 5.8  HGB 15.8   < > 12.6* 10.8* 12.7* 11.7* 12.9*  HCT 52.9*   < > 42.6 36.9* 42.2 39.0 40.2  MCV 103.1*   < > 104.9* 107.9* 105.0* 106.0* 99.8  PLT 308   < > 202 167 163 163 163   < > = values in this interval not displayed.   Basic Metabolic Panel: Recent Labs  Lab 11/15/18 0500 11/16/18 0439 11/17/18 0407 11/18/18 0555 11/19/18 0437  NA 156* 153* 152* 146* 142  K 3.6 3.3* 3.8 3.5 4.5  CL 126* 122* 123* 115* 111  CO2 23 26 25 23 24   GLUCOSE 147* 112* 108* 102* 102*  BUN 70* 57* 39* 28* 19  CREATININE 2.11* 1.70* 1.46* 1.19 1.05  CALCIUM 8.0* 8.1* 8.7* 8.3* 8.3*   GFR: Estimated Creatinine Clearance: 45.6 mL/min (by C-G formula based on SCr of 1.05 mg/dL). Liver Function Tests: Recent Labs  Lab 11/14/18 1730  AST 53*  ALT 31  ALKPHOS 82  BILITOT 1.2  PROT 8.0  ALBUMIN 3.1*   No results for input(s): LIPASE, AMYLASE in the last 168 hours. No results for input(s): AMMONIA in the last 168  hours. Coagulation Profile: No results for input(s): INR, PROTIME in the last 168 hours. Cardiac Enzymes: No results for input(s): CKTOTAL, CKMB, CKMBINDEX, TROPONINI in the last 168 hours. BNP (last 3 results) No results for input(s): PROBNP in the last 8760 hours. HbA1C: No results for input(s): HGBA1C in the last 72 hours. CBG: No results for input(s): GLUCAP in the last 168 hours. Lipid Profile: No results for input(s): CHOL, HDL, LDLCALC, TRIG, CHOLHDL, LDLDIRECT in the last 72 hours. Thyroid Function Tests: No results for input(s): TSH, T4TOTAL, FREET4, T3FREE, THYROIDAB in the last 72 hours. Anemia Panel: No results for input(s): VITAMINB12, FOLATE, FERRITIN, TIBC, IRON, RETICCTPCT in the last 72 hours. Sepsis Labs: Recent Labs  Lab 11/14/18 1730 11/14/18 1904  LATICACIDVEN 2.3* 1.8    Recent Results (from  the past 240 hour(s))  Urine culture     Status: None   Collection Time: 11/14/18  6:01 PM  Result Value Ref Range Status   Specimen Description   Final    URINE, CATHETERIZED Performed at [facility], [address] [place], [place] [address]    Special Requests   Final    NONE Performed at [facility], [address] [place], [place] [address]    Culture   Final    NO GROWTH Performed at [facility], [address] [place], [place] [address]    Report Status 11/16/2018 FINAL  Final  Blood culture (routine x 2)     Status: None   Collection Time: 11/14/18  8:14 PM  Result Value Ref Range Status   Specimen Description   Final    BLOOD RIGHT HAND Performed at [facility], [address] [place], [place] [address]    Special Requests   Final    BOTTLES DRAWN AEROBIC AND ANAEROBIC Blood Culture results may not be optimal due to an inadequate volume of blood received in culture bottles Performed at [facility], [address] [place], [place] [address]    Culture    Final    NO GROWTH 5 DAYS Performed at [facility], [address] [place], [place] [address]    Report Status 11/20/2018 FINAL  Final  Blood culture (routine x 2)     Status: None   Collection Time: 11/14/18  8:40 PM  Result Value Ref Range Status   Specimen Description   Final    BLOOD LEFT WRIST Performed at [facility], [address] [place], [place] [address]    Special Requests   Final    BOTTLES DRAWN AEROBIC AND ANAEROBIC Blood Culture adequate volume Performed at [facility], [address] [place], [place] [address]    Culture   Final    NO GROWTH 5 DAYS Performed at [facility], [address] [place], [place] [address]    Report Status 11/20/2018 FINAL  Final         Radiology Studies: No results found.      Scheduled Meds: . feeding supplement (ENSURE ENLIVE)  237 mL Oral BID BM  . mouth rinse  15 mL Mouth Rinse BID  . multivitamin with minerals  1 tablet Oral Daily  . nystatin   Topical TID  . pantoprazole  40 mg Oral BID AC  . rivaroxaban  15 mg Oral Q supper   Continuous Infusions: . sodium chloride       LOS: 6 days    Time spent: 25 min    Vonzella NippleAnupama Riki Berninger, MD [facility] Pager on amion If 7PM-7AM, please contact night-coverage [email or web address] Password [email or web address] 11/21/2018, 2:16 PM

## 2018-11-21 NOTE — Care Management Important Message (Signed)
Important Message  Patient Details  Name: Tony Reed MRN: [id number] Date of Birth: Apr 16, 1926   Medicare Important Message Given:  Yes    Caren Macadam 11/21/2018, 12:04 PMImportant Message  Patient Details  Name: Tony Reed MRN: [id number] Date of Birth: [birth year]   Medicare Important Message Given:  Yes    Caren Macadam 11/21/2018, 12:04 PM

## 2018-11-21 NOTE — Progress Notes (Signed)
Family selects [facility] for SNF rehab for pt. Facility aware of pt's dementia and feels can be accommodated. Insurance authorization initiated.  Ilean Skill, MSW, LCSW Clinical Social Work 11/21/2018 [phone]

## 2018-11-21 NOTE — Progress Notes (Signed)
Patient more alert today and talking.  Ate all of pudding at lunch today and part of soup.  For evening meal, patient able to eat 50% of meal.

## 2018-11-22 LAB — CBC
HCT: 40.9 % (ref 39.0–52.0)
Hemoglobin: 13 g/dL (ref 13.0–17.0)
MCH: 31.1 pg (ref 26.0–34.0)
MCHC: 31.8 g/dL (ref 30.0–36.0)
MCV: 97.8 fL (ref 80.0–100.0)
NRBC: 0 % (ref 0.0–0.2)
PLATELETS: 209 10*3/uL (ref 150–400)
RBC: 4.18 MIL/uL — ABNORMAL LOW (ref 4.22–5.81)
RDW: 13 % (ref 11.5–15.5)
WBC: 5.8 10*3/uL (ref 4.0–10.5)

## 2018-11-22 LAB — BASIC METABOLIC PANEL
Anion gap: 7 (ref 5–15)
BUN: 17 mg/dL (ref 8–23)
CALCIUM: 8.5 mg/dL — AB (ref 8.9–10.3)
CO2: 25 mmol/L (ref 22–32)
Chloride: 106 mmol/L (ref 98–111)
Creatinine, Ser: 1.16 mg/dL (ref 0.61–1.24)
GFR calc Af Amer: >60 mL/min (ref >60)
GFR, EST NON AFRICAN AMERICAN: 55 mL/min — AB (ref >60)
Glucose, Bld: 91 mg/dL (ref 70–99)
Potassium: 4 mmol/L (ref 3.5–5.1)
Sodium: 138 mmol/L (ref 135–145)

## 2018-11-22 MED ORDER — QUETIAPINE FUMARATE 25 MG PO TABS
25.0000 mg | ORAL_TABLET | Freq: Two times a day (BID) | ORAL | Status: DC
  Administered 2018-11-22 – 2018-11-23 (×2): 25 mg via ORAL
  Filled 2018-11-22 (×2): qty 1

## 2018-11-22 MED ORDER — DEXTROSE-NACL 5-0.45 % IV SOLN
INTRAVENOUS | Status: DC
  Administered 2018-11-22: 21:00:00 via INTRAVENOUS

## 2018-11-22 MED ORDER — LORAZEPAM 2 MG/ML IJ SOLN
0.5000 mg | Freq: Once | INTRAMUSCULAR | Status: AC
  Administered 2018-11-22: 0.5 mg via INTRAVENOUS
  Filled 2018-11-22: qty 1

## 2018-11-22 NOTE — Progress Notes (Signed)
Patient received insurance authorization for [facility] today 2/5- CSW notified MD for new DC orders/ summary. CSW will call for transportation once updated DC summary is in.   Stacy Gardner, LCSW Clinical Social Worker  [employer]  [phone]

## 2018-11-22 NOTE — Progress Notes (Addendum)
PROGRESS NOTE  BOBBY KROSCHEL SHU:[id number] DOB: Jun 19, 1926 DOA: 11/14/2018 PCP: Joaquim Nam, MD  HPI/Recap of past 24 hours: Oneal Deputy Robertsonis a 85 y.o.malewithhistory of CAD status post PCI, diastolic CHF, atrial fibrillation, dementia, chronic kidney disease, chronic anemia was brought to the ER after patient was found to have increasing confusion. As per the family patient has not been eating well for the last 5 to 6 days. Patient has become more confused and was taken to the primary care office and was referred to the ER for further work-up. Over the last few days patient also has noticed to have some dark stools. Does not take any NSAIDs but patient is on Xarelto for A. Fib.  In the ER patient had periods of confusion. Able to move all extremities follows commands. CT head was unremarkable. Chest x-ray shows possibility of pneumonia. UA is unremarkable. Patient was afebrile. Stool for occult blood was positive. Hemoglobin is around 15. Creatinine has increased from 1.25 twomonths ago to 2.5. Sodium is 159 WBC count is 12.5. Patient was given fluid bolus due to dehydration and started on D5W for hypernatremia with renal failure. Protonix started for GI bleed. Patient admitted for acute encephalopathy with acute renal failure hyponatremia GI bleed and pneumonia.  11/22/18: Patient seen and examined at his bedside.  He is somnolent and does not appear to be in distress.  Some agitation reported overnight.    Assessment/Plan: Principal Problem:   ARF (acute renal failure) (HCC) Active Problems:   Atrial fibrillation (HCC)   Chronic heart failure with preserved ejection fraction (HCC)   Hypernatremia   Pneumonia   Acute GI bleeding   Malnutrition of moderate degree   Heme positive stool  Acute metabolic encephalopathy probably secondary to hypernatremia and AKI -Some agitations overnight -Somnolent this am -when awake start seroquel 25 mg bId -  Patient has advanced dementia.   -CT head negative for any acute intracranial findings -Hypernatremia has resolved with sodium 138 from 159 on presentation  QTC prolongation Twelve-lead EKG done on 09/22/2019 revealed QTC > 500 Repeat twelve-lead EKG in the morning  AKI on CKD 3 Baseline creatinine appears to be 1.1 with GFR 55 Presented with creatinine of 1.70 Avoid nephrotoxic agent Continue gentle IV fluid hydration Monitor urine output   Chronic Atrial fibrillation: Rate controlled Since patient did not have any further drop in hemoglobin and there are no active signs of bleeding after discussion with GI Xarelto was resumed.  On xarelto  Guaiac positive stools possibly blood loss from GI tract H&H stable. Resume PPI.  GI consulted and recommendations given no plans for EGD at this time.  But if his hemoglobin drops while on Xarelto then GI will proceed with EGD in the future. Continue to monitor.   Hypokalemia: Potassium replaced.  -Resolved  History of chronic diastolic heart failure Appears euvolemic.   Community acquired pneumonia:  -Completed 5 days of antibiotics.  Afebrile without any leukocytosis at this time.  Disposition:  Pt has advanced dementia, lives alone, has malnutrition, palliative care consulted for goals of care.   Possible discharge to SNF tomorrow 11/23/2018  Physical debility/ambulatory dysfunction PT recommended SNF Placement established Possible discharge to SNF tomorrow 11/23/2018    DVT prophylaxis: xarelto Code Status: DNR Family Communication: No family at bedside at this time Disposition Plan: Possible discharge to SNF tomorrow 11/23/2018  Consultants:   Palliative care consult  Gastroenterology consult.   Procedures: None Antimicrobials: rocephin and zithromax since admission. Till 11/19/2018.  Objective: Vitals:   11/21/18 1449 11/21/18 2106 11/22/18 0658 11/22/18 1320  BP: 119/75 114/69 136/88 94/69  Pulse: 69  67 73 76  Resp: 16 20 18 18   Temp: 98 F (36.7 C) 98.1 F (36.7 C) (!) 97.5 F (36.4 C) 97.8 F (36.6 C)  TempSrc: Oral Oral Axillary   SpO2: 96% 97% 93% 96%  Weight:      Height:        Intake/Output Summary (Last 24 hours) at 11/22/2018 1814 Last data filed at 11/21/2018 1900 Gross per 24 hour  Intake 120 ml  Output -  Net 120 ml   Filed Weights   11/15/18 1047  Weight: 68.9 kg    Exam:  . General: 85 y.o. year-old male somnolent does not appear to be in distress.  . Cardiovascular: Regular rate and rhythm with no rubs or gallops.  No thyromegaly or JVD noted.   Marland Kitchen. Respiratory: Clear to auscultation with no wheezes or rales. Good inspiratory effort. . Abdomen: Soft nontender nondistended with normal bowel sounds x4 quadrants. . Musculoskeletal: No lower extremity edema. 2/4 pulses in all 4 extremities. Marland Kitchen. Psychiatry: Unable to assess mood due to somnolence.  Data Reviewed: CBC: Recent Labs  Lab 11/16/18 0439 11/17/18 0407 11/18/18 0555 11/19/18 0437 11/22/18 0425  WBC 8.9 7.9 7.0 7.4 5.8  NEUTROABS 7.6 6.6 5.5 5.8  --   HGB 10.8* 12.7* 11.7* 12.9* 13.0  HCT 36.9* 42.2 39.0 40.2 40.9  MCV 107.9* 105.0* 106.0* 99.8 97.8  PLT 167 163 163 163 209   Basic Metabolic Panel: Recent Labs  Lab 11/16/18 0439 11/17/18 0407 11/18/18 0555 11/19/18 0437 11/22/18 0425  NA 153* 152* 146* 142 138  K 3.3* 3.8 3.5 4.5 4.0  CL 122* 123* 115* 111 106  CO2 26 25 23 24 25   GLUCOSE 112* 108* 102* 102* 91  BUN 57* 39* 28* 19 17  CREATININE 1.70* 1.46* 1.19 1.05 1.16  CALCIUM 8.1* 8.7* 8.3* 8.3* 8.5*   GFR: Estimated Creatinine Clearance: 41.2 mL/min (by C-G formula based on SCr of 1.16 mg/dL). Liver Function Tests: No results for input(s): AST, ALT, ALKPHOS, BILITOT, PROT, ALBUMIN in the last 168 hours. No results for input(s): LIPASE, AMYLASE in the last 168 hours. No results for input(s): AMMONIA in the last 168 hours. Coagulation Profile: No results for input(s): INR,  PROTIME in the last 168 hours. Cardiac Enzymes: No results for input(s): CKTOTAL, CKMB, CKMBINDEX, TROPONINI in the last 168 hours. BNP (last 3 results) No results for input(s): PROBNP in the last 8760 hours. HbA1C: No results for input(s): HGBA1C in the last 72 hours. CBG: No results for input(s): GLUCAP in the last 168 hours. Lipid Profile: No results for input(s): CHOL, HDL, LDLCALC, TRIG, CHOLHDL, LDLDIRECT in the last 72 hours. Thyroid Function Tests: No results for input(s): TSH, T4TOTAL, FREET4, T3FREE, THYROIDAB in the last 72 hours. Anemia Panel: No results for input(s): VITAMINB12, FOLATE, FERRITIN, TIBC, IRON, RETICCTPCT in the last 72 hours. Urine analysis:    Component Value Date/Time   COLORURINE YELLOW 11/14/2018 1801   APPEARANCEUR CLEAR 11/14/2018 1801   LABSPEC 1.014 11/14/2018 1801   PHURINE 5.0 11/14/2018 1801   GLUCOSEU NEGATIVE 11/14/2018 1801   HGBUR MODERATE (A) 11/14/2018 1801   HGBUR large 01/29/2009 1157   BILIRUBINUR NEGATIVE 11/14/2018 1801   BILIRUBINUR neg 01/18/2013 1635   KETONESUR NEGATIVE 11/14/2018 1801   PROTEINUR NEGATIVE 11/14/2018 1801   UROBILINOGEN 0.2 01/18/2013 1635   UROBILINOGEN 0.2 01/29/2009 1157  NITRITE NEGATIVE 11/14/2018 1801   LEUKOCYTESUR NEGATIVE 11/14/2018 1801   Sepsis Labs: @LABRCNTIP (procalcitonin:4,lacticidven:4)  ) Recent Results (from the past 240 hour(s))  Urine culture     Status: None   Collection Time: 11/14/18  6:01 PM  Result Value Ref Range Status   Specimen Description   Final    URINE, CATHETERIZED Performed at [facility], [address] [place], [place] [address]    Special Requests   Final    NONE Performed at [facility], [address] [place], [place] [address]    Culture   Final    NO GROWTH Performed at [facility], [address] [place], [place] [address]    Report Status 11/16/2018 FINAL  Final  Blood culture (routine x 2)      Status: None   Collection Time: 11/14/18  8:14 PM  Result Value Ref Range Status   Specimen Description   Final    BLOOD RIGHT HAND Performed at [facility], [address] [place], [place] [address]    Special Requests   Final    BOTTLES DRAWN AEROBIC AND ANAEROBIC Blood Culture results may not be optimal due to an inadequate volume of blood received in culture bottles Performed at [facility], [address] [place], [place] [address]    Culture   Final    NO GROWTH 5 DAYS Performed at [facility], [address] [place], [place] [address]    Report Status 11/20/2018 FINAL  Final  Blood culture (routine x 2)     Status: None   Collection Time: 11/14/18  8:40 PM  Result Value Ref Range Status   Specimen Description   Final    BLOOD LEFT WRIST Performed at [facility], [address] [place], [place] [address]    Special Requests   Final    BOTTLES DRAWN AEROBIC AND ANAEROBIC Blood Culture adequate volume Performed at [facility], [address] [place], [place] [address]    Culture   Final    NO GROWTH 5 DAYS Performed at [facility], [address] [place], [place] [address]    Report Status 11/20/2018 FINAL  Final      Studies: No results found.  Scheduled Meds: . feeding supplement (ENSURE ENLIVE)  237 mL Oral BID BM  . mouth rinse  15 mL Mouth Rinse BID  . multivitamin with minerals  1 tablet Oral Daily  . nystatin   Topical TID  . pantoprazole  40 mg Oral BID AC  . QUEtiapine  25 mg Oral BID  . rivaroxaban  15 mg Oral Q supper    Continuous Infusions: . sodium chloride       LOS: 7 days     Darlin Droparole N Triana Coover, MD [facility] Pager [phone]  If 7PM-7AM, please contact night-coverage [email or web address] Password [email or web address] 11/22/2018, 6:14 PM

## 2018-11-22 NOTE — Progress Notes (Signed)
Patient is not fully awake after receiving prn medication for agitation on previous shift, MD aware, EKG ordered to be obtained prior to starting PO Seroquel, NT aware however first scheduled dose was held.

## 2018-11-22 NOTE — Progress Notes (Signed)
2nd shift ED CSW received a handoff from the 1st shift [facility] CSW.   CSW spoke to the RN who stated pt will be kept overnight to re-assess the pt in the morning due to medication that was given to the pt on 2/420.  [facility] is aware. Clydie Braun voiced understanding.  2nd shift ED CSW will leave handoff for 1st shift ED CSW.  CSW will continue to follow for D/C needs.  Dorothe Pea. Cherise Fedder, LCSW, LCAS, CSI Clinical Social Worker Ph: [phone]

## 2018-11-23 DIAGNOSIS — I482 Chronic atrial fibrillation, unspecified: Secondary | ICD-10-CM | POA: Diagnosis not present

## 2018-11-23 DIAGNOSIS — I2510 Atherosclerotic heart disease of native coronary artery without angina pectoris: Secondary | ICD-10-CM | POA: Diagnosis not present

## 2018-11-23 DIAGNOSIS — I700 Atherosclerosis of aorta: Secondary | ICD-10-CM | POA: Diagnosis not present

## 2018-11-23 DIAGNOSIS — E871 Hypo-osmolality and hyponatremia: Secondary | ICD-10-CM | POA: Diagnosis not present

## 2018-11-23 DIAGNOSIS — E870 Hyperosmolality and hypernatremia: Secondary | ICD-10-CM | POA: Diagnosis not present

## 2018-11-23 DIAGNOSIS — I5032 Chronic diastolic (congestive) heart failure: Secondary | ICD-10-CM | POA: Diagnosis not present

## 2018-11-23 DIAGNOSIS — K449 Diaphragmatic hernia without obstruction or gangrene: Secondary | ICD-10-CM | POA: Diagnosis not present

## 2018-11-23 DIAGNOSIS — I480 Paroxysmal atrial fibrillation: Secondary | ICD-10-CM | POA: Diagnosis not present

## 2018-11-23 DIAGNOSIS — Z87898 Personal history of other specified conditions: Secondary | ICD-10-CM | POA: Diagnosis not present

## 2018-11-23 DIAGNOSIS — Z955 Presence of coronary angioplasty implant and graft: Secondary | ICD-10-CM | POA: Diagnosis not present

## 2018-11-23 DIAGNOSIS — E1165 Type 2 diabetes mellitus with hyperglycemia: Secondary | ICD-10-CM | POA: Diagnosis not present

## 2018-11-23 DIAGNOSIS — K219 Gastro-esophageal reflux disease without esophagitis: Secondary | ICD-10-CM | POA: Diagnosis not present

## 2018-11-23 DIAGNOSIS — R404 Transient alteration of awareness: Secondary | ICD-10-CM | POA: Diagnosis not present

## 2018-11-23 DIAGNOSIS — E785 Hyperlipidemia, unspecified: Secondary | ICD-10-CM | POA: Diagnosis not present

## 2018-11-23 DIAGNOSIS — M810 Age-related osteoporosis without current pathological fracture: Secondary | ICD-10-CM | POA: Diagnosis not present

## 2018-11-23 DIAGNOSIS — I10 Essential (primary) hypertension: Secondary | ICD-10-CM | POA: Diagnosis not present

## 2018-11-23 DIAGNOSIS — E860 Dehydration: Secondary | ICD-10-CM | POA: Diagnosis not present

## 2018-11-23 DIAGNOSIS — Z79899 Other long term (current) drug therapy: Secondary | ICD-10-CM | POA: Diagnosis not present

## 2018-11-23 DIAGNOSIS — J189 Pneumonia, unspecified organism: Secondary | ICD-10-CM | POA: Diagnosis not present

## 2018-11-23 DIAGNOSIS — L209 Atopic dermatitis, unspecified: Secondary | ICD-10-CM | POA: Diagnosis not present

## 2018-11-23 DIAGNOSIS — R4020 Unspecified coma: Secondary | ICD-10-CM | POA: Diagnosis not present

## 2018-11-23 DIAGNOSIS — N183 Chronic kidney disease, stage 3 (moderate): Secondary | ICD-10-CM | POA: Diagnosis not present

## 2018-11-23 DIAGNOSIS — K5730 Diverticulosis of large intestine without perforation or abscess without bleeding: Secondary | ICD-10-CM | POA: Diagnosis not present

## 2018-11-23 DIAGNOSIS — R21 Rash and other nonspecific skin eruption: Secondary | ICD-10-CM | POA: Diagnosis not present

## 2018-11-23 DIAGNOSIS — G9341 Metabolic encephalopathy: Secondary | ICD-10-CM | POA: Diagnosis not present

## 2018-11-23 DIAGNOSIS — R627 Adult failure to thrive: Secondary | ICD-10-CM | POA: Diagnosis not present

## 2018-11-23 DIAGNOSIS — I252 Old myocardial infarction: Secondary | ICD-10-CM | POA: Diagnosis not present

## 2018-11-23 DIAGNOSIS — Z7401 Bed confinement status: Secondary | ICD-10-CM | POA: Diagnosis not present

## 2018-11-23 DIAGNOSIS — R410 Disorientation, unspecified: Secondary | ICD-10-CM | POA: Diagnosis not present

## 2018-11-23 DIAGNOSIS — Z66 Do not resuscitate: Secondary | ICD-10-CM | POA: Diagnosis not present

## 2018-11-23 DIAGNOSIS — E440 Moderate protein-calorie malnutrition: Secondary | ICD-10-CM | POA: Diagnosis not present

## 2018-11-23 DIAGNOSIS — Z7901 Long term (current) use of anticoagulants: Secondary | ICD-10-CM | POA: Diagnosis not present

## 2018-11-23 DIAGNOSIS — L299 Pruritus, unspecified: Secondary | ICD-10-CM | POA: Diagnosis not present

## 2018-11-23 DIAGNOSIS — R2681 Unsteadiness on feet: Secondary | ICD-10-CM | POA: Diagnosis not present

## 2018-11-23 DIAGNOSIS — N179 Acute kidney failure, unspecified: Secondary | ICD-10-CM | POA: Diagnosis not present

## 2018-11-23 DIAGNOSIS — M2550 Pain in unspecified joint: Secondary | ICD-10-CM | POA: Diagnosis not present

## 2018-11-23 DIAGNOSIS — H25812 Combined forms of age-related cataract, left eye: Secondary | ICD-10-CM | POA: Diagnosis not present

## 2018-11-23 DIAGNOSIS — K922 Gastrointestinal hemorrhage, unspecified: Secondary | ICD-10-CM | POA: Diagnosis not present

## 2018-11-23 DIAGNOSIS — M6281 Muscle weakness (generalized): Secondary | ICD-10-CM | POA: Diagnosis not present

## 2018-11-23 DIAGNOSIS — R0902 Hypoxemia: Secondary | ICD-10-CM | POA: Diagnosis not present

## 2018-11-23 DIAGNOSIS — R1312 Dysphagia, oropharyngeal phase: Secondary | ICD-10-CM | POA: Diagnosis not present

## 2018-11-23 DIAGNOSIS — I130 Hypertensive heart and chronic kidney disease with heart failure and stage 1 through stage 4 chronic kidney disease, or unspecified chronic kidney disease: Secondary | ICD-10-CM | POA: Diagnosis not present

## 2018-11-23 DIAGNOSIS — E538 Deficiency of other specified B group vitamins: Secondary | ICD-10-CM | POA: Diagnosis not present

## 2018-11-23 DIAGNOSIS — F0390 Unspecified dementia without behavioral disturbance: Secondary | ICD-10-CM | POA: Diagnosis not present

## 2018-11-23 LAB — BASIC METABOLIC PANEL
Anion gap: 9 (ref 5–15)
BUN: 14 mg/dL (ref 8–23)
CALCIUM: 8.2 mg/dL — AB (ref 8.9–10.3)
CO2: 23 mmol/L (ref 22–32)
CREATININE: 1.04 mg/dL (ref 0.61–1.24)
Chloride: 105 mmol/L (ref 98–111)
GFR calc Af Amer: >60 mL/min (ref >60)
GFR calc non Af Amer: >60 mL/min (ref >60)
Glucose, Bld: 91 mg/dL (ref 70–99)
Potassium: 3.7 mmol/L (ref 3.5–5.1)
Sodium: 137 mmol/L (ref 135–145)

## 2018-11-23 LAB — CBC
HCT: 38.3 % — ABNORMAL LOW (ref 39.0–52.0)
Hemoglobin: 11.9 g/dL — ABNORMAL LOW (ref 13.0–17.0)
MCH: 31.9 pg (ref 26.0–34.0)
MCHC: 31.1 g/dL (ref 30.0–36.0)
MCV: 102.7 fL — ABNORMAL HIGH (ref 80.0–100.0)
Platelets: 185 10*3/uL (ref 150–400)
RBC: 3.73 MIL/uL — ABNORMAL LOW (ref 4.22–5.81)
RDW: 13.1 % (ref 11.5–15.5)
WBC: 4.6 10*3/uL (ref 4.0–10.5)
nRBC: 0 % (ref 0.0–0.2)

## 2018-11-23 MED ORDER — QUETIAPINE FUMARATE 25 MG PO TABS: 25.0000 mg | ORAL_TABLET | Freq: Two times a day (BID) | ORAL | 0 refills | Status: AC

## 2018-11-23 MED ORDER — PANTOPRAZOLE SODIUM 40 MG PO TBEC: 40.0000 mg | DELAYED_RELEASE_TABLET | Freq: Two times a day (BID) | ORAL | 0 refills | Status: DC

## 2018-11-23 NOTE — Clinical Social Work Placement (Signed)
D/C Summary sent.  Nurse call report to: [phone] [facility] arranged for transport ETA:3hrs. (daughter notified)  CLINICAL SOCIAL WORK PLACEMENT  NOTE  Date:  11/23/2018  Patient Details  Name: Tony Reed MRN: [id number] Date of Birth: March 20, 1926  Clinical Social Work is seeking post-discharge placement for this patient at the Skilled  Nursing Facility level of care (*CSW will initial, date and re-position this form in  chart as items are completed):  Yes   Patient/family provided with [facility] Clinical Social Work Department's list of facilities offering this level of care within the geographic area requested by the patient (or if unable, by the patient's family).  Yes   Patient/family informed of their freedom to choose among providers that offer the needed level of care, that participate in Medicare, Medicaid or managed care program needed by the patient, have an available bed and are willing to accept the patient.  Yes   Patient/family informed of [facility]'s ownership interest in [facility] and [facility], as well as of the fact that they are under no obligation to receive care at these facilities.  PASRR submitted to EDS on       PASRR number received on       Existing PASRR number confirmed on       FL2 transmitted to all facilities in geographic area requested by pt/family on       FL2 transmitted to all facilities within larger geographic area on       Patient informed that his/her managed care company has contracts with or will negotiate with certain facilities, including the following:  [facility]         Patient/family informed of bed offers received.  [facility]     Physician recommends and patient chooses bed at      [facility] be [facility].  Patient to be transferred to facility by [facility]     Patient family notified on 11/23/18 of transfer.  Name of family  member notified:  Daughter Vernona RiegerLaura and Zella BallRobin      PHYSICIAN Please prepare priority discharge summary, including medications     Additional Comment:    _______________________________________________ Clearance CootsNicole A Alandra Sando, LCSW 11/23/2018, 2:55 PM

## 2018-11-23 NOTE — Progress Notes (Signed)
Pt discharging with [facility] at this time in stable condition. Attempted to call daughter, Vernona Rieger. Call forwarded to voicemail. Voicemail left.

## 2018-11-23 NOTE — Progress Notes (Signed)
Pt discharging to [facility] today per Dr. Margo Aye. Pt's IV site D/C'd and WDL. Pt's VSS. Report called to nurse Miranda. Verbalized understanding. Pt currently awaiting [facility] transportation. Nursing staff to call daughter, Nelly Rout [phone]), upon [facility] arrival.

## 2018-11-23 NOTE — Progress Notes (Signed)
Assessment completed and is agreeable with prior assessment. 

## 2018-11-23 NOTE — Progress Notes (Signed)
Physical Therapy Treatment Patient Details Name: Tony Reed MRN: [id number] DOB: 1925-11-20 Today's Date: 11/23/2018    History of Present Illness 85 yo male admitted with GI bleed, confusion, ARF. Hx of dementia, A fib, CAD, CHF    PT Comments    Pt assisted OOB to recliner and required at least min assist for mobility.  Pt encouraged to sit up in recliner to eat lunch.  Pt brushing his hair back with his hand so provided comb, and pt able to comb his hair.  Continue to recommend SNF upon d/c.   Follow Up Recommendations  SNF;Supervision/Assistance - 24 hour     Equipment Recommendations  Rolling walker with 5" wheels;3in1 (PT)    Recommendations for Other Services       Precautions / Restrictions Precautions Precautions: Fall Restrictions Weight Bearing Restrictions: No    Mobility  Bed Mobility Overal bed mobility: Needs Assistance Bed Mobility: Supine to Sit     Supine to sit: Min assist     General bed mobility comments: initial assist as manual cue for bringing LEs over EOB, pt attempting to assist thereafter  Transfers Overall transfer level: Needs assistance Equipment used: 1 person hand held assist Transfers: Sit to/from Stand Sit to Stand: Min assist Stand pivot transfers: Min assist       General transfer comment: multimodal cues provided for technique, pt declined RW however reached for HHA and then utilized armrest on recliner, pt encouraged with mobility by up to chair for lunch however once lunch set up, pt did not start instead was focused on brushing his hair and beard  Ambulation/Gait                 Stairs             Wheelchair Mobility    Modified Rankin (Stroke Patients Only)       Balance           Standing balance support: Bilateral upper extremity supported Standing balance-Leahy Scale: Poor                              Cognition Arousal/Alertness: Awake/alert Behavior During Therapy:  Restless Overall Cognitive Status: No family/caregiver present to determine baseline cognitive functioning                                 General Comments: pt sleeping on arrival however easily aroused, tends to keep eyes closed if not engaged      Exercises      General Comments        Pertinent Vitals/Pain Pain Assessment: Faces Faces Pain Scale: No hurt Pain Intervention(s): Repositioned;Monitored during session    Home Living                      Prior Function            PT Goals (current goals can now be found in the care plan section) Progress towards PT goals: Progressing toward goals    Frequency    Min 2X/week      PT Plan Current plan remains appropriate    Co-evaluation              AM-PAC PT "6 Clicks" Mobility   Outcome Measure  Help needed turning from your back to your side while in a flat bed without using bedrails?:  A Lot Help needed moving from lying on your back to sitting on the side of a flat bed without using bedrails?: A Lot Help needed moving to and from a bed to a chair (including a wheelchair)?: A Lot Help needed standing up from a chair using your arms (e.g., wheelchair or bedside chair)?: A Lot Help needed to walk in hospital room?: A Lot Help needed climbing 3-5 steps with a railing? : Total 6 Click Score: 11    End of Session Equipment Utilized During Treatment: Gait belt Activity Tolerance: Patient limited by fatigue Patient left: in chair;with chair alarm set;with call bell/phone within reach Nurse Communication: Mobility status PT Visit Diagnosis: Unsteadiness on feet (R26.81);Difficulty in walking, not elsewhere classified (R26.2)     Time: 6067-7034 PT Time Calculation (min) (ACUTE ONLY): 17 min  Charges:  $Therapeutic Activity: 8-22 mins                     Zenovia Jarred, PT, DPT Acute Rehabilitation Services Office: [phone] Pager: [phone]  Sarajane Jews 11/23/2018,  2:18 PM

## 2018-11-23 NOTE — Discharge Instructions (Signed)
Community-Acquired Pneumonia, Adult °Pneumonia is an infection of the lungs. It causes swelling in the airways of the lungs. Mucus and fluid may also build up inside the airways. °One type of pneumonia can happen while a person is in a hospital. A different type can happen when a person is not in a hospital (community-acquired pneumonia).  °What are the causes? ° °This condition is caused by germs (viruses, bacteria, or fungi). Some types of germs can be passed from one person to another. This can happen when you breathe in droplets from the cough or sneeze of an infected person. °What increases the risk? °You are more likely to develop this condition if you: °· Have a long-term (chronic) disease, such as: °? Chronic obstructive pulmonary disease (COPD). °? Asthma. °? Cystic fibrosis. °? Congestive heart failure. °? Diabetes. °? Kidney disease. °· Have HIV. °· Have sickle cell disease. °· Have had your spleen removed. °· Do not take good care of your teeth and mouth (poor dental hygiene). °· Have a medical condition that increases the risk of breathing in droplets from your own mouth and nose. °· Have a weakened body defense system (immune system). °· Are a smoker. °· Travel to areas where the germs that cause this illness are common. °· Are around certain animals or the places they live. °What are the signs or symptoms? °· A dry cough. °· A wet (productive) cough. °· Fever. °· Sweating. °· Chest pain. This often happens when breathing deeply or coughing. °· Fast breathing or trouble breathing. °· Shortness of breath. °· Shaking chills. °· Feeling tired (fatigue). °· Muscle aches. °How is this treated? °Treatment for this condition depends on many things. Most adults can be treated at home. In some cases, treatment must happen in a hospital. Treatment may include: °· Medicines given by mouth or through an IV tube. °· Being given extra oxygen. °· Respiratory therapy. °In rare cases, treatment for very bad pneumonia  may include: °· Using a machine to help you breathe. °· Having a procedure to remove fluid from around your lungs. °Follow these instructions at home: °Medicines °· Take over-the-counter and prescription medicines only as told by your doctor. °? Only take cough medicine if you are losing sleep. °· If you were prescribed an antibiotic medicine, take it as told by your doctor. Do not stop taking the antibiotic even if you start to feel better. °General instructions ° °· Sleep with your head and neck raised (elevated). You can do this by sleeping in a recliner or by putting a few pillows under your head. °· Rest as needed. Get at least 8 hours of sleep each night. °· Drink enough water to keep your pee (urine) pale yellow. °· Eat a healthy diet that includes plenty of vegetables, fruits, whole grains, low-fat dairy products, and lean protein. °· Do not use any products that contain nicotine or tobacco. These include cigarettes, e-cigarettes, and chewing tobacco. If you need help quitting, ask your doctor. °· Keep all follow-up visits as told by your doctor. This is important. °How is this prevented? °A shot (vaccine) can help prevent pneumonia. Shots are often suggested for: °· People older than 83 years of age. °· People older than 83 years of age who: °? Are having cancer treatment. °? Have long-term (chronic) lung disease. °? Have problems with their body's defense system. °You may also prevent pneumonia if you take these actions: °· Get the flu (influenza) shot every year. °· Go to the dentist as   often as told.  Wash your hands often. If you cannot use soap and water, use hand sanitizer. Contact a doctor if:  You have a fever.  You lose sleep because your cough medicine does not help. Get help right away if:  You are short of breath and it gets worse.  You have more chest pain.  Your sickness gets worse. This is very serious if: ? You are an older adult. ? Your body's defense system is weak.  You  cough up blood. Summary  Pneumonia is an infection of the lungs.  Most adults can be treated at home. Some will need treatment in a hospital.  Drink enough water to keep your pee pale yellow.  Get at least 8 hours of sleep each night. This information is not intended to replace advice given to you by your health care provider. Make sure you discuss any questions you have with your health care provider. Document Released: 03/22/2008 Document Revised: 06/01/2018 Document Reviewed: 06/01/2018 Elsevier Interactive Patient Education  2019 [employer]   Dehydration, Adult  Dehydration is when there is not enough fluid or water in your body. This happens when you lose more fluids than you take in. Dehydration can range from mild to very bad. It should be treated right away to keep it from getting very bad. Symptoms of mild dehydration may include:  Thirst.  Dry lips.  Slightly dry mouth.  Dry, warm skin.  Dizziness. Symptoms of moderate dehydration may include:  Very dry mouth.  Muscle cramps.  Dark pee (urine). Pee may be the color of tea.  Your body making less pee.  Your eyes making fewer tears.  Heartbeat that is uneven or faster than normal (palpitations).  Headache.  Light-headedness, especially when you stand up from sitting.  Fainting (syncope). Symptoms of very bad dehydration may include:  Changes in skin, such as: ? Cold and clammy skin. ? Blotchy (mottled) or pale skin. ? Skin that does not quickly return to normal after being lightly pinched and let go (poor skin turgor).  Changes in body fluids, such as: ? Feeling very thirsty. ? Your eyes making fewer tears. ? Not sweating when body temperature is high, such as in hot weather. ? Your body making very little pee.  Changes in vital signs, such as: ? Weak pulse. ? Pulse that is more than 100 beats a minute when you are sitting still. ? Fast breathing. ? Low blood pressure.  Other changes,  such as: ? Sunken eyes. ? Cold hands and feet. ? Confusion. ? Lack of energy (lethargy). ? Trouble waking up from sleep. ? Short-term weight loss. ? Unconsciousness. Follow these instructions at home:   If told by your doctor, drink an ORS: ? Make an ORS by using instructions on the package. ? Start by drinking small amounts, about  cup (120 mL) every 5-10 minutes. ? Slowly drink more until you have had the amount that your doctor said to have.  Drink enough clear fluid to keep your pee clear or pale yellow. If you were told to drink an ORS, finish the ORS first, then start slowly drinking clear fluids. Drink fluids such as: ? Water. Do not drink only water by itself. Doing that can make the salt (sodium) level in your body get too low (hyponatremia). ? Ice chips. ? Fruit juice that you have added water to (diluted). ? Low-calorie sports drinks.  Avoid: ? Alcohol. ? Drinks that have a lot of sugar. These include high-calorie  sports drinks, fruit juice that does not have water added, and soda. ? Caffeine. ? Foods that are greasy or have a lot of fat or sugar.  Take over-the-counter and prescription medicines only as told by your doctor.  Do not take salt tablets. Doing that can make the salt level in your body get too high (hypernatremia).  Eat foods that have minerals (electrolytes). Examples include bananas, oranges, potatoes, tomatoes, and spinach.  Keep all follow-up visits as told by your doctor. This is important. Contact a doctor if:  You have belly (abdominal) pain that: ? Gets worse. ? Stays in one area (localizes).  You have a rash.  You have a stiff neck.  You get angry or annoyed more easily than normal (irritability).  You are more sleepy than normal.  You have a harder time waking up than normal.  You feel: ? Weak. ? Dizzy. ? Very thirsty.  You have peed (urinated) only a small amount of very dark pee during 6-8 hours. Get help right away  if:  You have symptoms of very bad dehydration.  You cannot drink fluids without throwing up (vomiting).  Your symptoms get worse with treatment.  You have a fever.  You have a very bad headache.  You are throwing up or having watery poop (diarrhea) and it: ? Gets worse. ? Does not go away.  You have blood or something green (bile) in your throw-up.  You have blood in your poop (stool). This may cause poop to look black and tarry.  You have not peed in 6-8 hours.  You pass out (faint).  Your heart rate when you are sitting still is more than 100 beats a minute.  You have trouble breathing. This information is not intended to replace advice given to you by your health care provider. Make sure you discuss any questions you have with your health care provider. Document Released: 07/31/2009 Document Revised: 04/23/2016 Document Reviewed: 11/28/2015 Elsevier Interactive Patient Education  2019 [employer] Inc[employer]   Dehydration  Dehydration is when there is not enough fluid or water in your body. This happens when you lose more fluids than you take in. People who are age 40 or older have a higher risk of getting dehydrated. Dehydration can range from mild to very bad. It should be treated right away to keep it from getting very bad. Symptoms of mild dehydration may include:  Thirst.  Dry lips.  Slightly dry mouth.  Dry, warm skin.  Dizziness. Symptoms of moderate dehydration may include:  Very dry mouth.  Muscle cramps.  Dark pee (urine). Pee may be the color of tea.  Your body making less pee.  Your eyes making fewer tears.  Heartbeat that is uneven or faster than normal (palpitations).  Headache.  Light-headedness, especially when you stand up from sitting.  Fainting (syncope). Symptoms of very bad dehydration may include:  Changes in skin, such as: ? Cold and clammy skin. ? Blotchy (mottled) or pale skin. ? Skin that does not quickly return to normal  after being lightly pinched and let go (poor skin turgor).  Changes in body fluids, such as: ? Feeling very thirsty. ? Your eyes making fewer tears. ? Not sweating when body temperature is high, such as in hot weather. ? Your body making very little pee.  Changes in vital signs, such as: ? Weak pulse. ? Pulse that is more than 100 beats a minute when you are sitting still. ? Fast breathing. ? Low blood pressure.  Other changes, such as: ? Sunken eyes. ? Cold hands and feet. ? Confusion. ? Lack of energy (lethargy). ? Trouble waking up from sleep. ? Short-term weight loss. ? Unconsciousness. Follow these instructions at home:   If told by your doctor, drink an ORS: ? Make an ORS by using instructions on the package. ? Start by drinking small amounts, about  cup (120 mL) every 5-10 minutes. ? Slowly drink more until you have had the amount that your doctor said to have.  Drink enough clear fluid to keep your pee clear or pale yellow. If you were told to drink an ORS, finish the ORS first, then start slowly drinking clear fluids. Drink fluids such as: ? Water. Do not drink only water by itself. Doing that can make the salt (sodium) level in your body get too low (hyponatremia). ? Ice chips. ? Fruit juice that you have added water to (diluted). ? Low-calorie sports drinks.  Avoid: ? Alcohol. ? Drinks that have a lot of sugar. These include high-calorie sports drinks, fruit juice that does not have water added, and soda. ? Caffeine. ? Foods that are greasy or have a lot of fat or sugar.  Take over-the-counter and prescription medicines only as told by your doctor.  Do not take salt tablets. Doing that can make the salt level in your body get too high (hypernatremia).  Eat foods that have minerals (electrolytes). Examples include bananas, oranges, potatoes, tomatoes, and spinach.  Keep all follow-up visits as told by your doctor. This is important. Contact a doctor  if:  You have belly (abdominal) pain that: ? Gets worse. ? Stays in one area (localizes).  You have a rash.  You have a stiff neck.  You get angry or annoyed more easily than normal (irritability).  You are more sleepy than normal.  You have a harder time waking up than normal.  You feel: ? Weak. ? Dizzy. ? Very thirsty. Get help right away if:  You have symptoms of very bad dehydration.  You cannot drink fluids without throwing up (vomiting).  Your symptoms get worse with treatment.  You have a fever.  You have a very bad headache.  You are throwing up or having watery poop (diarrhea) and it: ? Gets worse. ? Does not go away.  You have diarrhea for more than 24 hours.  You have blood or something green (bile) in your throw-up.  You have blood in your poop (stool). This may cause poop to look black and tarry.  You have not peed in 6-8 hours.  You have peed (urinated) only a small amount of very dark pee during 6-8 hours.  You pass out (faint).  Your heart rate when you are sitting still is more than 100 beats a minute.  You have trouble breathing. This information is not intended to replace advice given to you by your health care provider. Make sure you discuss any questions you have with your health care provider. Document Released: 09/23/2011 Document Revised: 04/23/2016 Document Reviewed: 11/28/2015 Elsevier Interactive Patient Education  2019 [employer] Inc.   Hypernatremia  Hypernatremia is when the blood has too much salt (sodium) in it and not enough water. Water and salt must be balanced in the blood. This condition is serious and often an emergency. Follow these instructions at home:  Drink enough fluid to keep your pee (urine) clear or pale yellow.  Take over-the-counter and prescription medicines only as told by your doctor.  Avoid salty processed foods,  including canned, jarred, frozen, or boxed foods. Some examples include pickles, frozen  dinners, canned soups, potato and corn chips, and olives.  Always drink fluids after exercise.  Always drink fluids after throwing up (vomiting) or having watery poop (diarrhea).  Keep all follow-up visits as told by your doctor. This is important. Contact a doctor if:  You have watery poop.  You throw up.  You have a fever or chills.  You cannot follow advice from your doctor about drinking fluids. Get help right away if:  You feel light-headed or weak.  You have a fast heartbeat.  You get confused.  You get restless.  You get short-tempered.  You have a fit of movements that you cannot control (seizure).  You faint.  You have signs of losing too much body fluid (dehydration), such as: ? Dark pee, or very little or no pee. ? Cracked lips. ? No tears. ? Dry mouth. ? Sunken eyes. ? Sleepiness. This information is not intended to replace advice given to you by your health care provider. Make sure you discuss any questions you have with your health care provider. Document Released: 09/23/2011 Document Revised: 03/11/2016 Document Reviewed: 02/19/2015 Elsevier Interactive Patient Education  2019 [employer] Inc.   Acute Kidney Injury, Adult  Acute kidney injury is a sudden worsening of kidney function. The kidneys are organs that have several jobs. They filter the blood to remove waste products and extra fluid. They also maintain a healthy balance of minerals and hormones in the body, which helps control blood pressure and keep bones strong. With this condition, your kidneys do not do their jobs as well as they should. This condition ranges from mild to severe. Over time it may develop into long-lasting (chronic) kidney disease. Early detection and treatment may prevent acute kidney injury from developing into a chronic condition. What are the causes? Common causes of this condition include:  A problem with blood flow to the kidneys. This may be caused by: ? Low blood  pressure (hypotension) or shock. ? Blood loss. ? Heart and blood vessel (cardiovascular) disease. ? Severe burns. ? Liver disease.  Direct damage to the kidneys. This may be caused by: ? Certain medicines. ? A kidney infection. ? Poisoning. ? Being around or in contact with toxic substances. ? A surgical wound. ? A hard, direct hit to the kidney area.  A sudden blockage of urine flow. This may be caused by: ? Cancer. ? Kidney stones. ? An enlarged prostate in males. What are the signs or symptoms? Symptoms of this condition may not be obvious until the condition becomes severe. Symptoms of this condition can include:  Tiredness (lethargy), or difficulty staying awake.  Nausea or vomiting.  Swelling (edema) of the face, legs, ankles, or feet.  Problems with urination, such as: ? Abdominal pain, or pain along the side of your stomach (flank). ? Decreased urine production. ? Decrease in the force of urine flow.  Muscle twitches and cramps, especially in the legs.  Confusion or trouble concentrating.  Loss of appetite.  Fever. How is this diagnosed? This condition may be diagnosed with tests, including:  Blood tests.  Urine tests.  Imaging tests.  A test in which a sample of tissue is removed from the kidneys to be examined under a microscope (kidney biopsy). How is this treated? Treatment for this condition depends on the cause and how severe the condition is. In mild cases, treatment may not be needed. The kidneys may heal on their  own. In more severe cases, treatment will involve:  Treating the cause of the kidney injury. This may involve changing any medicines you are taking or adjusting your dosage.  Fluids. You may need specialized IV fluids to balance your body's needs.  Having a catheter placed to drain urine and prevent blockages.  Preventing problems from occurring. This may mean avoiding certain medicines or procedures that can cause further injury to  the kidneys. In some cases treatment may also require:  A procedure to remove toxic wastes from the body (dialysis or continuous renal replacement therapy - CRRT).  Surgery. This may be done to repair a torn kidney, or to remove the blockage from the urinary system. Follow these instructions at home: Medicines  Take over-the-counter and prescription medicines only as told by your health care provider.  Do not take any new medicines without your health care provider's approval. Many medicines can worsen your kidney damage.  Do not take any vitamin and mineral supplements without your health care provider's approval. Many nutritional supplements can worsen your kidney damage. Lifestyle  If your health care provider prescribed changes to your diet, follow them. You may need to decrease the amount of protein you eat.  Achieve and maintain a healthy weight. If you need help with this, ask your health care provider.  Start or continue an exercise plan. Try to exercise at least 30 minutes a day, 5 days a week.  Do not use any tobacco products, such as cigarettes, chewing tobacco, and e-cigarettes. If you need help quitting, ask your health care provider. General instructions  Keep track of your blood pressure. Report changes in your blood pressure as told by your health care provider.  Stay up to date with immunizations. Ask your health care provider which immunizations you need.  Keep all follow-up visits as told by your health care provider. This is important. Where to find more information  [employer] of Kidney Patients: ResidentialShow.is  [employer]: www.kidney.org  [employer]: FightingMatch.com.ee  [employer] [facility]: ? www.lifeoptions.org ? www.kidneyschool.org Contact a health care provider if:  Your symptoms get worse.  You develop new symptoms. Get help right away if:  You develop symptoms of worsening kidney disease, which  include: ? Headaches. ? Abnormally dark or light skin. ? Easy bruising. ? Frequent hiccups. ? Chest pain. ? Shortness of breath. ? End of menstruation in women. ? Seizures. ? Confusion or altered mental status. ? Abdominal or back pain. ? Itchiness.  You have a fever.  Your body is producing less urine.  You have pain or bleeding when you urinate. Summary  Acute kidney injury is a sudden worsening of kidney function.  Acute kidney injury can be caused by problems with blood flow to the kidneys, direct damage to the kidneys, and sudden blockage of urine flow.  Symptoms of this condition may not be obvious until it becomes severe. Symptoms may include edema, lethargy, confusion, nausea or vomiting, and problems passing urine.  This condition can usually be diagnosed with blood tests, urine tests, and imaging tests. Sometimes a kidney biopsy is done to diagnose this condition.  Treatment for this condition often involves treating the underlying cause. It is treated with fluids, medicines, dialysis, diet changes, or surgery. This information is not intended to replace advice given to you by your health care provider. Make sure you discuss any questions you have with your health care provider. Document Released: 04/19/2011 Document Revised: 02/03/2017 Document Reviewed: 09/24/2016 Elsevier Interactive  Patient Education  2019 [employer].

## 2018-11-23 NOTE — Discharge Summary (Signed)
Discharge Summary  Tony Reed Tony Reed DOB: 26-Feb-1926  PCP: Joaquim Namuncan, Graham S, MD  Admit date: 11/14/2018 Discharge date: 11/23/2018  Time spent: 35 minutes  Recommendations for Outpatient Follow-up:  1. Follow-up with your PCP 2. Take your medications as prescribed 3. Continue physical therapy 4. Fall precautions  Discharge Diagnoses:  Active Hospital Problems   Diagnosis Date Noted  . ARF (acute renal failure) (HCC) 11/14/2018  . Heme positive stool   . Malnutrition of moderate degree 11/15/2018  . Hypernatremia 11/14/2018  . Pneumonia 11/14/2018  . Acute GI bleeding 11/14/2018  . Chronic heart failure with preserved ejection fraction (HCC) 03/24/2017  . Atrial fibrillation (HCC) 06/25/2009    Resolved Hospital Problems  No resolved problems to display.    Discharge Condition: Stable  Diet recommendation: Diet as recommended by nutritionist  Vitals:   11/22/18 2100 11/23/18 0548  BP: 104/88 (!) 110/58  Pulse: 74 89  Resp: 18 16  Temp: 99.8 F (37.7 C) 97.7 F (36.5 C)  SpO2: 100% 97%    History of present illness:  Tony Deputydward L Robertsonis a 85 y.o.malewithhistory of CAD status post PCI, diastolic CHF, atrial fibrillation, dementia, chronic kidney disease, chronic anemia was brought to the ER after patient was found to have increasing confusion. As per the family patient has not been eating well for the last 5 to 6 days. Patient has become more confused and was taken to the primary care office and was referred to the ER for further work-up. Over the last few days patient also has noticed to have some dark stools. Does not take any NSAIDs but patient is on Xarelto for A. Fib.  In the ER patient hadperiods of confusion. Able to move all extremities follows commands. CT head was unremarkable. Chest x-ray shows possibility of pneumonia. UA is unremarkable. Patient was afebrile. Stool for occult blood was positive. Hemoglobin is around 15.  Creatinine has increased from 1.25 twomonths ago to 2.5. Sodium is 159 WBC count is 12.5. Patient was given fluid bolus due to dehydration and started on D5W for hypernatremia with renal failure. Protonix started for GI bleed. Patient admitted for acute encephalopathy with acute renal failure hyponatremia GI bleed and pneumonia.  11/23/2018: Patient seen and examined at his bedside.  Alert and eating his breakfast.  No sign of distress.  On the day of discharge, the patient was hemodynamically stable.  He will need to follow-up with his primary care provider and continue physical therapy at SNF.    Hospital Course:  Principal Problem:   ARF (acute renal failure) (HCC) Active Problems:   Atrial fibrillation (HCC)   Chronic heart failure with preserved ejection fraction (HCC)   Hypernatremia   Pneumonia   Acute GI bleeding   Malnutrition of moderate degree   Heme positive stool  Improving acute metabolic encephalopathy suspect secondary to hypernatremia versus dehydration -Reorient as indicated  -Continue Seroquel 25 mg bId -Patient has advanced dementia.  -CT head negative for any acute intracranial findings -Hypernatremia has resolved with sodium 137 from 159 on presentation  Resolving QTC prolongation Twelve-lead EKG done on 09/22/2019 revealed QTC > 500 Repeat twelve-lead EKG done on 11/23/2018: 489  Solved AKI on CKD 3 Baseline creatinine appears to be 1.0 with GFR greater than 60 Presented with creatinine of 1.70 Continue to avoid nephrotoxic agent Follow-up with your PCP  Chronic Atrial fibrillation: Rate controlled On Xarelto for CVA prophylaxis  Guaiac positive stools possibly blood loss from GI tract Hemoglobin stable at 11.9  GI consulted and recommendations given no plans for EGD at this time.  But if his hemoglobin drops while on Xarelto then GI will proceed with EGD in the future. No sign of overt bleeding  Hypokalemia:Potassiumreplaced.    -Resolved  History of chronic diastolic heart failure Appears euvolemic.   Community acquired pneumonia: -Completed 5 days of antibiotics. Afebrile without any leukocytosis at this time.  Disposition:  Pt has advanced dementia, lives alone, has malnutrition, palliative care consulted for goals of care.  Discharge to SNF  Physical debility/ambulatory dysfunction PT recommended SNF Placement established Continue physical therapy at SNF    DVT prophylaxis:xarelto Code Status:DNR   Consultants:  Palliative care consult  Gastroenterology consult.   Procedures:None Antimicrobials: rocephin and zithromax since admission. Till 11/19/2018.    Discharge Exam: BP (!) 110/58 (BP Location: Left Arm)   Pulse 89   Temp 97.7 F (36.5 C) (Oral)   Resp 16   Ht 5\' 11"  (1.803 m)   Wt 68.9 kg   SpO2 97%   BMI 21.19 kg/m  . General: 85 y.o. year-old male well developed well nourished in no acute distress.  Alert and oriented x3. . Cardiovascular: Regular rate and rhythm with no rubs or gallops.  No thyromegaly or JVD noted.   Marland Kitchen Respiratory: Clear to auscultation with no wheezes or rales. Good inspiratory effort. . Abdomen: Soft nontender nondistended with normal bowel sounds x4 quadrants. . Musculoskeletal: No lower extremity edema. 2/4 pulses in all 4 extremities. . Skin: No ulcerative lesions noted or rashes, . Psychiatry: Mood is appropriate for condition and setting  Discharge Instructions You were cared for by a hospitalist during your hospital stay. If you have any questions about your discharge medications or the care you received while you were in the hospital after you are discharged, you can call the unit and asked to speak with the hospitalist on call if the hospitalist that took care of you is not available. Once you are discharged, your primary care physician will handle any further medical issues. Please note that NO REFILLS for any discharge medications  will be authorized once you are discharged, as it is imperative that you return to your primary care physician (or establish a relationship with a primary care physician if you do not have one) for your aftercare needs so that they can reassess your need for medications and monitor your lab values.  Discharge Instructions    Diet - low sodium heart healthy   Complete by:  As directed    Discharge instructions   Complete by:  As directed    Please follow up with PCP in one week.     Allergies as of 11/23/2018      Reactions   Namenda [memantine Hcl] Other (See Comments)   diarrhea   Naproxen Other (See Comments)   ABD PAIN, DIARRHEA   Penicillins Other (See Comments)   Was able to tolerate keflex 08/2017      Medication List    STOP taking these medications   ramipril 2.5 MG capsule Commonly known as:  ALTACE     TAKE these medications   atorvastatin 80 MG tablet Commonly known as:  LIPITOR TAKE 1 TABLET BY MOUTH EVERY DAY   feeding supplement (ENSURE ENLIVE) Liqd Take 237 mLs by mouth 2 (two) times daily between meals.   ferrous sulfate 325 (65 FE) MG tablet Take 325 mg by mouth daily with breakfast.   furosemide 40 MG tablet Commonly known as:  LASIX TAKE  1 TABLET(40 MG) BY MOUTH DAILY   multivitamin with minerals Tabs tablet Take 1 tablet by mouth daily.   nystatin powder Commonly known as:  MYCOSTATIN/NYSTOP Apply topically 3 (three) times daily.   pantoprazole 40 MG tablet Commonly known as:  PROTONIX Take 1 tablet (40 mg total) by mouth 2 (two) times daily before a meal.   potassium chloride SA 20 MEQ tablet Commonly known as:  K-DUR,KLOR-CON Take 1 tablet (20 mEq total) by mouth daily.   QUEtiapine 25 MG tablet Commonly known as:  SEROQUEL Take 1 tablet (25 mg total) by mouth 2 (two) times daily.   vitamin B-12 1000 MCG tablet Commonly known as:  CYANOCOBALAMIN Take 1 tablet (1,000 mcg total) by mouth daily.   XARELTO 15 MG Tabs tablet Generic  drug:  Rivaroxaban TAKE 1 TABLET(15 MG) BY MOUTH DAILY WITH SUPPER      Allergies  Allergen Reactions  . Namenda [Memantine Hcl] Other (See Comments)    diarrhea  . Naproxen Other (See Comments)    ABD PAIN, DIARRHEA  . Penicillins Other (See Comments)    Was able to tolerate keflex 08/2017   Follow-up Information    Joaquim Namuncan, Graham S, MD. Schedule an appointment as soon as possible for a visit in 1 week(s).   Specialty:  Family Medicine Contact information: [address] [place] [address] [phone]        Yvonne KendallEnd, Christopher, MD .   Specialty:  Cardiology Contact information: [address] [place] [address] [phone]            The results of significant diagnostics from this hospitalization (including imaging, microbiology, ancillary and laboratory) are listed below for reference.    Significant Diagnostic Studies: Ct Head Wo Contrast  Result Date: 11/14/2018 CLINICAL DATA:  Altered level of consciousness. EXAM: CT HEAD WITHOUT CONTRAST TECHNIQUE: Contiguous axial images were obtained from the base of the skull through the vertex without intravenous contrast. COMPARISON:  09/04/2017 FINDINGS: Brain: There is no evidence for acute hemorrhage, hydrocephalus, mass lesion, or abnormal extra-axial fluid collection. No definite CT evidence for acute infarction. Diffuse loss of parenchymal volume is consistent with atrophy. Patchy low attenuation in the deep hemispheric and periventricular white matter is nonspecific, but likely reflects chronic microvascular ischemic demyelination. Vascular: No hyperdense vessel or unexpected calcification. Skull: No evidence for fracture. No worrisome lytic or sclerotic lesion. Sinuses/Orbits: The visualized paranasal sinuses and right mastoid air cells are clear. Fluid noted in posterior left mastoid air cells. Visualized portions of the globes and intraorbital fat are unremarkable. Other: None. IMPRESSION: 1. No  acute intracranial abnormality. 2. Atrophy with chronic small vessel white matter ischemic disease. Electronically Signed   By: Kennith CenterEric  Mansell M.D.   On: 11/14/2018 18:31   Dg Chest Portable 1 View  Result Date: 11/14/2018 CLINICAL DATA:  Dementia.  Decreased appetite. EXAM: PORTABLE CHEST 1 VIEW COMPARISON:  09/04/2017 FINDINGS: Right lung is clear. Patchy airspace disease noted left base. Interstitial markings are diffusely coarsened with chronic features. The cardio pericardial silhouette is enlarged. Bones are diffusely demineralized. Face obscures the lung apices. IMPRESSION: Probable emphysema with left base patchy airspace disease suggesting pneumonia. Electronically Signed   By: Kennith CenterEric  Mansell M.D.   On: 11/14/2018 17:56    Microbiology: Recent Results (from the past 240 hour(s))  Urine culture     Status: None   Collection Time: 11/14/18  6:01 PM  Result Value Ref Range Status   Specimen Description  Final    URINE, CATHETERIZED Performed at [facility], [address], [place], [place] [address]    Special Requests   Final    NONE Performed at [facility], [address], [place], [place] [address]    Culture   Final    NO GROWTH Performed at [facility], [address], [place], [place] [address]    Report Status 11/16/2018 FINAL  Final  Blood culture (routine x 2)     Status: None   Collection Time: 11/14/18  8:14 PM  Result Value Ref Range Status   Specimen Description   Final    BLOOD RIGHT HAND Performed at [facility], [address], [place], [place] [address]    Special Requests   Final    BOTTLES DRAWN AEROBIC AND ANAEROBIC Blood Culture results may not be optimal due to an inadequate volume of blood received in culture bottles Performed at [facility], [address], [place], [place] [address]    Culture   Final    NO GROWTH 5 DAYS Performed at [facility], [address], [place], [place] [address]    Report Status 11/20/2018 FINAL  Final  Blood culture (routine x 2)     Status: None   Collection Time: 11/14/18  8:40 PM  Result Value Ref Range Status   Specimen Description   Final    BLOOD LEFT WRIST Performed at [facility], [address], [place], [place] [address]    Special Requests   Final    BOTTLES DRAWN AEROBIC AND ANAEROBIC Blood Culture adequate volume Performed at [facility], [address], [place], [place] [address]    Culture   Final    NO GROWTH 5 DAYS Performed at [facility], [address] [place], [place] [address]    Report Status 11/20/2018 FINAL  Final     Labs: Basic Metabolic Panel: Recent Labs  Lab 11/17/18 0407 11/18/18 0555 11/19/18 0437 11/22/18 0425 11/23/18 0700  NA 152* 146* 142 138 137  K 3.8 3.5 4.5 4.0 3.7  CL 123* 115* 111 106 105  CO2 25 23 24 25 23   GLUCOSE 108* 102* 102* 91 91  BUN 39* 28* 19 17 14   CREATININE 1.46* 1.19 1.05 1.16 1.04  CALCIUM 8.7* 8.3* 8.3* 8.5* 8.2*   Liver Function Tests: No results for input(s): AST, ALT, ALKPHOS, BILITOT, PROT, ALBUMIN in the last 168 hours. No results for input(s): LIPASE, AMYLASE in the last 168 hours. No results for input(s): AMMONIA in the last 168 hours. CBC: Recent Labs  Lab 11/17/18 0407 11/18/18 0555 11/19/18 0437 11/22/18 0425 11/23/18 0700  WBC 7.9 7.0 7.4 5.8 4.6  NEUTROABS 6.6 5.5 5.8  --   --   HGB 12.7* 11.7* 12.9* 13.0 11.9*  HCT 42.2 39.0 40.2 40.9 38.3*  MCV 105.0* 106.0* 99.8 97.8 102.7*  PLT 163 163 163 209 185   Cardiac Enzymes: No results for input(s): CKTOTAL, CKMB, CKMBINDEX, TROPONINI in the last 168 hours. BNP: BNP (last 3 results) No results for input(s): BNP in the last 8760 hours.  ProBNP (last 3 results) No results for input(s): PROBNP in the last 8760 hours.  CBG: No results for input(s): GLUCAP in the last 168 hours.     Signed:  Darlin Drop,  MD [facility] 11/23/2018, 2:17 PM

## 2018-11-27 DIAGNOSIS — E860 Dehydration: Secondary | ICD-10-CM | POA: Diagnosis not present

## 2018-12-06 DIAGNOSIS — L299 Pruritus, unspecified: Secondary | ICD-10-CM | POA: Diagnosis not present

## 2018-12-06 DIAGNOSIS — R21 Rash and other nonspecific skin eruption: Secondary | ICD-10-CM | POA: Diagnosis not present

## 2018-12-06 DIAGNOSIS — L209 Atopic dermatitis, unspecified: Secondary | ICD-10-CM | POA: Diagnosis not present

## 2018-12-07 DIAGNOSIS — E870 Hyperosmolality and hypernatremia: Secondary | ICD-10-CM | POA: Diagnosis not present

## 2018-12-07 DIAGNOSIS — R627 Adult failure to thrive: Secondary | ICD-10-CM | POA: Diagnosis not present

## 2018-12-07 DIAGNOSIS — E860 Dehydration: Secondary | ICD-10-CM | POA: Diagnosis not present

## 2018-12-08 ENCOUNTER — Encounter (HOSPITAL_COMMUNITY): Payer: Self-pay | Admitting: Emergency Medicine

## 2018-12-08 ENCOUNTER — Emergency Department (HOSPITAL_COMMUNITY): Payer: Medicare Other

## 2018-12-08 ENCOUNTER — Other Ambulatory Visit: Payer: Self-pay

## 2018-12-08 ENCOUNTER — Observation Stay (HOSPITAL_COMMUNITY)
Admission: EM | Admit: 2018-12-08 | Discharge: 2018-12-09 | Payer: Medicare Other | Attending: Internal Medicine | Admitting: Internal Medicine

## 2018-12-08 DIAGNOSIS — I2510 Atherosclerotic heart disease of native coronary artery without angina pectoris: Secondary | ICD-10-CM | POA: Diagnosis not present

## 2018-12-08 DIAGNOSIS — E870 Hyperosmolality and hypernatremia: Secondary | ICD-10-CM | POA: Diagnosis present

## 2018-12-08 DIAGNOSIS — I130 Hypertensive heart and chronic kidney disease with heart failure and stage 1 through stage 4 chronic kidney disease, or unspecified chronic kidney disease: Secondary | ICD-10-CM | POA: Diagnosis not present

## 2018-12-08 DIAGNOSIS — Z515 Encounter for palliative care: Secondary | ICD-10-CM

## 2018-12-08 DIAGNOSIS — K449 Diaphragmatic hernia without obstruction or gangrene: Secondary | ICD-10-CM | POA: Diagnosis not present

## 2018-12-08 DIAGNOSIS — Z7901 Long term (current) use of anticoagulants: Secondary | ICD-10-CM | POA: Insufficient documentation

## 2018-12-08 DIAGNOSIS — I5032 Chronic diastolic (congestive) heart failure: Secondary | ICD-10-CM | POA: Diagnosis not present

## 2018-12-08 DIAGNOSIS — M810 Age-related osteoporosis without current pathological fracture: Secondary | ICD-10-CM | POA: Diagnosis not present

## 2018-12-08 DIAGNOSIS — E538 Deficiency of other specified B group vitamins: Secondary | ICD-10-CM | POA: Insufficient documentation

## 2018-12-08 DIAGNOSIS — I480 Paroxysmal atrial fibrillation: Secondary | ICD-10-CM | POA: Diagnosis not present

## 2018-12-08 DIAGNOSIS — E785 Hyperlipidemia, unspecified: Secondary | ICD-10-CM | POA: Insufficient documentation

## 2018-12-08 DIAGNOSIS — Z79899 Other long term (current) drug therapy: Secondary | ICD-10-CM | POA: Diagnosis not present

## 2018-12-08 DIAGNOSIS — F01518 Vascular dementia, unspecified severity, with other behavioral disturbance: Secondary | ICD-10-CM

## 2018-12-08 DIAGNOSIS — I5033 Acute on chronic diastolic (congestive) heart failure: Secondary | ICD-10-CM | POA: Diagnosis present

## 2018-12-08 DIAGNOSIS — I10 Essential (primary) hypertension: Secondary | ICD-10-CM | POA: Diagnosis present

## 2018-12-08 DIAGNOSIS — Z66 Do not resuscitate: Secondary | ICD-10-CM | POA: Insufficient documentation

## 2018-12-08 DIAGNOSIS — N179 Acute kidney failure, unspecified: Secondary | ICD-10-CM | POA: Diagnosis not present

## 2018-12-08 DIAGNOSIS — R413 Other amnesia: Secondary | ICD-10-CM | POA: Diagnosis present

## 2018-12-08 DIAGNOSIS — Z955 Presence of coronary angioplasty implant and graft: Secondary | ICD-10-CM | POA: Insufficient documentation

## 2018-12-08 DIAGNOSIS — F0151 Vascular dementia with behavioral disturbance: Secondary | ICD-10-CM

## 2018-12-08 DIAGNOSIS — E871 Hypo-osmolality and hyponatremia: Secondary | ICD-10-CM | POA: Diagnosis not present

## 2018-12-08 DIAGNOSIS — R4182 Altered mental status, unspecified: Secondary | ICD-10-CM | POA: Diagnosis present

## 2018-12-08 DIAGNOSIS — I252 Old myocardial infarction: Secondary | ICD-10-CM | POA: Insufficient documentation

## 2018-12-08 DIAGNOSIS — I700 Atherosclerosis of aorta: Secondary | ICD-10-CM | POA: Diagnosis not present

## 2018-12-08 DIAGNOSIS — R0902 Hypoxemia: Principal | ICD-10-CM | POA: Insufficient documentation

## 2018-12-08 DIAGNOSIS — N183 Chronic kidney disease, stage 3 unspecified: Secondary | ICD-10-CM | POA: Diagnosis present

## 2018-12-08 DIAGNOSIS — I4891 Unspecified atrial fibrillation: Secondary | ICD-10-CM | POA: Diagnosis present

## 2018-12-08 DIAGNOSIS — F0390 Unspecified dementia without behavioral disturbance: Secondary | ICD-10-CM | POA: Insufficient documentation

## 2018-12-08 DIAGNOSIS — E860 Dehydration: Secondary | ICD-10-CM | POA: Diagnosis not present

## 2018-12-08 DIAGNOSIS — K219 Gastro-esophageal reflux disease without esophagitis: Secondary | ICD-10-CM | POA: Diagnosis not present

## 2018-12-08 LAB — POCT I-STAT 7, (LYTES, BLD GAS, ICA,H+H)
Acid-Base Excess: 1 mmol/L (ref 0.0–2.0)
Bicarbonate: 26.1 mmol/L (ref 20.0–28.0)
Calcium, Ion: 1.18 mmol/L (ref 1.15–1.40)
HCT: 36 % — ABNORMAL LOW (ref 39.0–52.0)
Hemoglobin: 12.2 g/dL — ABNORMAL LOW (ref 13.0–17.0)
O2 SAT: 100 %
Potassium: 4.5 mmol/L (ref 3.5–5.1)
Sodium: 172 mmol/L (ref 135–145)
TCO2: 27 mmol/L (ref 22–32)
pCO2 arterial: 43.9 mmHg (ref 32.0–48.0)
pH, Arterial: 7.382 (ref 7.350–7.450)
pO2, Arterial: 436 mmHg — ABNORMAL HIGH (ref 83.0–108.0)

## 2018-12-08 LAB — CBC WITH DIFFERENTIAL/PLATELET
Abs Immature Granulocytes: 0.02 10*3/uL (ref 0.00–0.07)
Basophils Absolute: 0 10*3/uL (ref 0.0–0.1)
Basophils Relative: 0 %
EOS ABS: 0.1 10*3/uL (ref 0.0–0.5)
Eosinophils Relative: 1 %
HCT: 45.5 % (ref 39.0–52.0)
Hemoglobin: 13.3 g/dL (ref 13.0–17.0)
Immature Granulocytes: 0 %
Lymphocytes Relative: 14 %
Lymphs Abs: 1 10*3/uL (ref 0.7–4.0)
MCH: 31.8 pg (ref 26.0–34.0)
MCHC: 29.2 g/dL — AB (ref 30.0–36.0)
MCV: 108.9 fL — ABNORMAL HIGH (ref 80.0–100.0)
Monocytes Absolute: 0.5 10*3/uL (ref 0.1–1.0)
Monocytes Relative: 6 %
Neutro Abs: 6 10*3/uL (ref 1.7–7.7)
Neutrophils Relative %: 79 %
Platelets: 201 10*3/uL (ref 150–400)
RBC: 4.18 MIL/uL — ABNORMAL LOW (ref 4.22–5.81)
RDW: 16.6 % — ABNORMAL HIGH (ref 11.5–15.5)
WBC: 7.6 10*3/uL (ref 4.0–10.5)
nRBC: 0.9 % — ABNORMAL HIGH (ref 0.0–0.2)

## 2018-12-08 LAB — COMPREHENSIVE METABOLIC PANEL
ALT: 39 U/L (ref 0–44)
AST: 49 U/L — ABNORMAL HIGH (ref 15–41)
Albumin: 3.3 g/dL — ABNORMAL LOW (ref 3.5–5.0)
Alkaline Phosphatase: 100 U/L (ref 38–126)
BUN: 72 mg/dL — ABNORMAL HIGH (ref 8–23)
CO2: 21 mmol/L — ABNORMAL LOW (ref 22–32)
Calcium: 8.8 mg/dL — ABNORMAL LOW (ref 8.9–10.3)
Chloride: >130 mmol/L (ref 98–111)
Creatinine, Ser: 3.45 mg/dL — ABNORMAL HIGH (ref 0.61–1.24)
GFR calc Af Amer: 17 mL/min — ABNORMAL LOW (ref >60)
GFR calc non Af Amer: 15 mL/min — ABNORMAL LOW (ref >60)
Glucose, Bld: 101 mg/dL — ABNORMAL HIGH (ref 70–99)
Potassium: 5.1 mmol/L (ref 3.5–5.1)
Sodium: 169 mmol/L (ref 135–145)
Total Bilirubin: 1.3 mg/dL — ABNORMAL HIGH (ref 0.3–1.2)
Total Protein: 7.3 g/dL (ref 6.5–8.1)

## 2018-12-08 LAB — I-STAT TROPONIN, ED: Troponin i, poc: 0.11 ng/mL (ref 0.00–0.08)

## 2018-12-08 MED ORDER — DIPHENHYDRAMINE HCL 50 MG/ML IJ SOLN: 12.5000 mg | INTRAMUSCULAR | Status: DC | PRN

## 2018-12-08 MED ORDER — LORAZEPAM 2 MG/ML PO CONC: 1.0000 mg | ORAL | Status: DC | PRN

## 2018-12-08 MED ORDER — POLYVINYL ALCOHOL 1.4 % OP SOLN
1.0000 [drp] | Freq: Four times a day (QID) | OPHTHALMIC | Status: DC | PRN
  Filled 2018-12-08: qty 15

## 2018-12-08 MED ORDER — MORPHINE SULFATE (CONCENTRATE) 10 MG/0.5ML PO SOLN
5.0000 mg | ORAL | Status: DC | PRN
  Administered 2018-12-09: 5 mg via ORAL
  Filled 2018-12-08: qty 0.5

## 2018-12-08 MED ORDER — LORAZEPAM 1 MG PO TABS: 1.0000 mg | ORAL_TABLET | ORAL | Status: DC | PRN

## 2018-12-08 MED ORDER — GLYCOPYRROLATE 0.2 MG/ML IJ SOLN: 0.2000 mg | INTRAMUSCULAR | Status: DC | PRN

## 2018-12-08 MED ORDER — GLYCOPYRROLATE 1 MG PO TABS
1.0000 mg | ORAL_TABLET | ORAL | Status: DC | PRN
  Filled 2018-12-08: qty 1

## 2018-12-08 MED ORDER — LORAZEPAM 2 MG/ML IJ SOLN: 1.0000 mg | INTRAMUSCULAR | Status: DC | PRN

## 2018-12-08 MED ORDER — SODIUM CHLORIDE 0.9 % IV BOLUS: 1000.0000 mL | Freq: Once | INTRAVENOUS | Status: DC

## 2018-12-08 MED ORDER — MORPHINE SULFATE (CONCENTRATE) 10 MG/0.5ML PO SOLN: 5.0000 mg | ORAL | Status: DC | PRN

## 2018-12-08 MED ORDER — HYDROMORPHONE HCL 1 MG/ML IJ SOLN
0.5000 mg | INTRAMUSCULAR | Status: DC | PRN
  Administered 2018-12-08: 0.5 mg via INTRAMUSCULAR
  Filled 2018-12-08: qty 1

## 2018-12-08 NOTE — ED Provider Notes (Signed)
[facility] Provider Note   CSN: [id number] Arrival date & time: 12/08/18  1350    History   Chief Complaint No chief complaint on file.   HPI Tony Reed is a 85 y.o. male with a PMH of CAD, CHF, CKD, Paroxysmal atrial fibrillation on  Xarelto, and Dementia presenting with shortness of breath. Daughters are contributing historians. [facility] and diagnosed with pneumonia. Daughters report patient was hospitalized for 1 week and discharged on 02/06 to a rehab facility. Daughters report they have spoken to Palliative care and their wishes are to make the patient comfortable. Patient is a poor historian due to dementia. Daughters report this is patient's baseline.  Level 5 caveat due to dementia.     HPI  Past Medical History:  Diagnosis Date  . CAD (coronary artery disease)    inferior MI in 2003 w BMS to RCA. DES to CFX in 2008.  Normal Myoviews in 5/13.  Marland Kitchen. CHF (congestive heart failure) (HCC)    diastolic: Echo (4/11) w EF 50-60%, moderate MR, moderate to sever LAE, severe RAE, moderate to sever TR  . CKD (chronic kidney disease)   . Diverticulitis, colon   . GERD (gastroesophageal reflux disease)   . Hyperlipidemia   . Hypertension   . Leg edema    L>R chronically  . Leg pain    and decrease pulses consistent w peripheral vasc disease  . Memory difficulty   . Osteoporosis    s/p 5+ years of fosamax treatment as of 2013  . Paroxysmal atrial fibrillation (HCC)    w history of DCCV  . UTI (urinary tract infection)    with altered mentation 2018    Patient Active Problem List   Diagnosis Date Noted  . Acute hypernatremia 12/08/2018  . Heme positive stool   . Malnutrition of moderate degree 11/15/2018  . Altered mental status 11/14/2018  . Hypernatremia 11/14/2018  . ARF (acute renal failure) (HCC) 11/14/2018  . Pneumonia 11/14/2018  .  Acute GI bleeding 11/14/2018  . Chronic kidney disease, stage 3 (HCC) 01/23/2018  . Prolonged Q-T interval on ECG 01/23/2018  . Seborrheic keratoses 09/30/2017  . UTI (urinary tract infection) 09/21/2017  . Chronic heart failure with preserved ejection fraction (HCC) 03/24/2017  . Cough 10/27/2015  . Long toenail 05/16/2015  . Advance care planning 05/16/2015  . Dementia without behavioral disturbance (HCC) 05/16/2015  . Encounter for therapeutic drug monitoring 12/05/2013  . Orthostasis 04/21/2012  . Orthostatic hypotension 03/14/2012  . Syncope 03/09/2012  . Osteoporosis 02/11/2012  . Memory loss 08/11/2011  . Hyperlipidemia LDL goal <70 03/31/2011  . DIASTOLIC HEART FAILURE, CHRONIC 11/26/2010  . DIASTOLIC HEART FAILURE, ACUTE ON CHRONIC Jun 01, 202011  . Atrial fibrillation (HCC) 06/25/2009  . CARDIOVASCULAR FUNCTION STUDY, ABNORMAL 06/20/2009  . Coronary artery disease involving native coronary artery of native heart without angina pectoris 06/13/2009  . UNSPECIFIED VITAMIN D DEFICIENCY 01/29/2009  . Edema 01/22/2008  . TESTOSTERONE DEFICIENCY 02/22/2007  . B12 deficiency 02/22/2007  . ANEMIA DUE TO CHRONIC BLOOD LOSS 02/22/2007  . ANEMIA, IRON DEFICIENCY 02/22/2007  . CATARACT, LEFT EYE 02/22/2007  . Essential hypertension 02/22/2007  . DIVERTICULOSIS, COLON 02/22/2007  . BENIGN PROSTATIC HYPERTROPHY, HX OF 02/22/2007    Past Surgical History:  Procedure Laterality Date  . adenosine cardiolite  10/03/02   old infarct inf o/w negative EF 61% 10/03/02  . CARDIAC CATHETERIZATION  1986  normal  . CARDIOVERSION  02/03/10   successful on Mutaq (Dr. Juanda Chance)   . Cath  06/25/09   nonobstructive disease afib (Dr. Juanda Chance)  . Cath, stents  09/27/02  . choleycystectomy  1996  . COLONOSCOPY  12/13/08   mod divertics no polyps (Dr Juanda Chance)  . CT  11/21/03   abd mod H. H., CT pelvis elevated prostate  . CYSTOSCOPY  09/26/03   Vonita Moss  . Echo multiple findings  01/18/10  .  ESOPHAGOGASTRODUODENOSCOPY  12/05/03   postiive H-Pylori, H.H., stricture   . FLEXIBLE SIGMOIDOSCOPY  1/98  . hemmoroidectoy    . herniorraphy     x2  . [facility]osp[facility] R/O  01/06/04  . poypectomy    . stent in distal circum  04/17/02        Home Medications    Prior to Admission medications   Medication Sig Start Date End Date Taking? Authorizing Provider  atorvastatin (LIPITOR) 80 MG tablet TAKE 1 TABLET BY MOUTH EVERY DAY 10/04/18   End, Cristal Deer, MD  feeding supplement, ENSURE ENLIVE, (ENSURE ENLIVE) LIQD Take 237 mLs by mouth 2 (two) times daily between meals. 11/20/18   Kathlen Mody, MD  ferrous sulfate 325 (65 FE) MG tablet Take 325 mg by mouth daily with breakfast.      [provider]  furosemide (LASIX) 40 MG tablet TAKE 1 TABLET(40 MG) BY MOUTH DAILY 10/19/18   Laurey Morale, MD  Multiple Vitamin (MULTIVITAMIN WITH MINERALS) TABS tablet Take 1 tablet by mouth daily. 11/20/18   Kathlen Mody, MD  nystatin (MYCOSTATIN/NYSTOP) powder Apply topically 3 (three) times daily. 11/20/18   Kathlen Mody, MD  pantoprazole (PROTONIX) 40 MG tablet Take 1 tablet (40 mg total) by mouth 2 (two) times daily before a meal. 11/23/18   Darlin Drop, DO  potassium chloride SA (K-DUR,KLOR-CON) 20 MEQ tablet Take 1 tablet (20 mEq total) by mouth daily. 01/25/18   End, Cristal Deer, MD  QUEtiapine (SEROQUEL) 25 MG tablet Take 1 tablet (25 mg total) by mouth 2 (two) times daily. 11/23/18   Darlin Drop, DO  vitamin B-12 (CYANOCOBALAMIN) 1000 MCG tablet Take 1 tablet (1,000 mcg total) by mouth daily. 05/20/16   Joaquim Nam, MD  XARELTO 15 MG TABS tablet TAKE 1 TABLET(15 MG) BY MOUTH DAILY WITH SUPPER 07/12/18   End, Cristal Deer, MD    Family History Family History  Problem Relation Age of Onset  . Hypertension Mother   . Leukemia Mother   . Lung cancer Brother        smoking  . Heart Problems Brother        smoker  . Deafness Brother     Social History Social History   Tobacco Use  .  Smoking status: Never Smoker  . Smokeless tobacco: Never Used  Substance Use Topics  . Alcohol use: No  . Drug use: No     Allergies   Namenda [memantine hcl]; Naproxen; and Penicillins   Review of Systems Review of Systems  Unable to perform ROS: Dementia   Physical Exam Updated Vital Signs BP (!) 155/139 (BP Location: Right Arm)   Pulse (!) 109   Temp (!) 97.4 F (36.3 C) (Oral)   Resp (!) 24   Ht 5\' 11"  (1.803 m)   Wt 68.9 kg   SpO2 (!) 80%   BMI 21.19 kg/m   Physical Exam Vitals signs and nursing note reviewed.  Constitutional:      Appearance: He is well-developed. He is  ill-appearing. He is not diaphoretic.     Comments: Patient is nonverbal and only follows some commands.  HENT:     Head: Normocephalic and atraumatic.     Mouth/Throat:     Mouth: Mucous membranes are moist.     Pharynx: No oropharyngeal exudate or posterior oropharyngeal erythema.  Eyes:     Extraocular Movements: Extraocular movements intact.     Conjunctiva/sclera: Conjunctivae normal.     Pupils: Pupils are equal, round, and reactive to light.  Neck:     Musculoskeletal: Normal range of motion and neck supple.  Cardiovascular:     Rate and Rhythm: Regular rhythm. Tachycardia present.     Heart sounds: Normal heart sounds. No murmur. No friction rub. No gallop.   Pulmonary:     Effort: Pulmonary effort is normal. No respiratory distress.     Breath sounds: Normal breath sounds. No wheezing or rales.  Abdominal:     Palpations: Abdomen is soft.     Tenderness: There is no abdominal tenderness.  Musculoskeletal: Normal range of motion.  Skin:    Findings: No erythema or rash.  Neurological:     Mental Status: He is alert. Mental status is at baseline.     Comments: Pt is at baseline according to family. Patient is able to squeeze hands, but does not cooperate with two step commands for neurological exam.    ED Treatments / Results  Labs (all labs ordered are listed, but only  abnormal results are displayed) Labs Reviewed  COMPREHENSIVE METABOLIC PANEL - Abnormal; Notable for the following components:      Result Value   Sodium 169 (*)    Chloride >130 (*)    CO2 21 (*)    Glucose, Bld 101 (*)    BUN 72 (*)    Creatinine, Ser 3.45 (*)    Calcium 8.8 (*)    Albumin 3.3 (*)    AST 49 (*)    Total Bilirubin 1.3 (*)    GFR calc non Af Amer 15 (*)    GFR calc Af Amer 17 (*)    All other components within normal limits  CBC WITH DIFFERENTIAL/PLATELET - Abnormal; Notable for the following components:   RBC 4.18 (*)    MCV 108.9 (*)    MCHC 29.2 (*)    RDW 16.6 (*)    nRBC 0.9 (*)    All other components within normal limits  I-STAT TROPONIN, ED - Abnormal; Notable for the following components:   Troponin i, poc 0.11 (*)    All other components within normal limits  POCT I-STAT 7, (LYTES, BLD GAS, ICA,H+H) - Abnormal; Notable for the following components:   pO2, Arterial 436.0 (*)    Sodium 172 (*)    HCT 36.0 (*)    Hemoglobin 12.2 (*)    All other components within normal limits  I-STAT ARTERIAL BLOOD GAS, ED    EKG EKG Interpretation  Date/Time:  Friday December 08 2018 13:59:13 EST Ventricular Rate:  116 PR Interval:    QRS Duration: 106 QT Interval:  366 QTC Calculation: 509 R Axis:   74 Text Interpretation:  Atrial fibrillation Ventricular premature complex RSR' in V1 or V2, probably normal variant Minimal ST depression, anterolateral leads Prolonged QT interval Artifact in lead(s) I II III aVR aVL aVF V1 V2 V4 V5 V6 No significant change was found Confirmed by Azalia Bilis ([id number]) on 12/08/2018 3:06:19 PM   Radiology Dg Chest 2 View  Result Date: 12/08/2018  CLINICAL DATA:  Hypoxia today. EXAM: CHEST - 2 VIEW COMPARISON:  Single-view of the chest 11/14/2018. PA and lateral chest 09/04/2017. FINDINGS: Lungs are clear. Hiatal hernia is noted. Mild elevation of the right hemidiaphragm is unchanged. No pneumothorax or pleural effusion. Aortic  atherosclerosis is seen. No acute or focal bony abnormality. IMPRESSION: No acute disease. Cardiomegaly. Atherosclerosis. Hiatal hernia. Electronically Signed   By: Drusilla Kanner M.D.   On: 12/08/2018 14:58    Procedures Procedures (including critical care time)  Medications Ordered in ED Medications  sodium chloride 0.9 % bolus 1,000 mL (has no administration in time range)  morphine CONCENTRATE 10 MG/0.5ML oral solution 5 mg (has no administration in time range)    Or  morphine CONCENTRATE 10 MG/0.5ML oral solution 5 mg (has no administration in time range)  HYDROmorphone (DILAUDID) injection 0.5 mg (has no administration in time range)  LORazepam (ATIVAN) tablet 1 mg (has no administration in time range)    Or  LORazepam (ATIVAN) 2 MG/ML concentrated solution 1 mg (has no administration in time range)    Or  LORazepam (ATIVAN) injection 1 mg (has no administration in time range)  diphenhydrAMINE (BENADRYL) injection 12.5 mg (has no administration in time range)  glycopyrrolate (ROBINUL) tablet 1 mg (has no administration in time range)    Or  glycopyrrolate (ROBINUL) injection 0.2 mg (has no administration in time range)    Or  glycopyrrolate (ROBINUL) injection 0.2 mg (has no administration in time range)  polyvinyl alcohol (LIQUIFILM TEARS) 1.4 % ophthalmic solution 1 drop (has no administration in time range)     Initial Impression / Assessment and Plan / ED Course  I have reviewed the triage vital signs and the nursing notes.  Pertinent labs & imaging results that were available during my care of the patient were reviewed by me and considered in my medical decision making (see chart for details).  Clinical Course as of Dec 09 1623  Fri Dec 08, 2018  1524 No acute disease. Cardiomegaly, Atherosclerosis, and Hiatal hernia noted on CXR.    DG Chest 2 View [AH]  1524 Elevated troponin noted at 0.11.  Troponin i, poc(!!): 0.11 [AH]  1535 Hypernatremia at 169 and Chloride  elevated at 130.  Sodium(!!): 169 [AH]  1535 Creatinine elevated at 3.45 consistent with AKI.  Creatinine(!): 3.45 [AH]    Clinical Course User Index [AH] Leretha Dykes, PA-C      Patient presents with shortness of breath. Provided NRB while in the ER with oxygen levels reading in the 80s with poor waveform. CXR does not reveal acute disease. ABG ordered due to poor waveform on oxygen saturation. Patient is DNR. Creatinine elevated at 3.45 consistent with AKI. Hypernatremia noted likely due to dehydration. Provided IVF. Patient will require inpatient admission due to AKI and hypoxia. Consulted hospitalist and hospitalist has agreed to admit patient.   Findings and plan of care discussed with supervising physician Dr. Patria Mane.  Final Clinical Impressions(s) / ED Diagnoses   Final diagnoses:  Hypoxia  AKI (acute kidney injury) [facility])    ED Discharge Orders    None       Glade Stanford 12/08/18 1625    Azalia Bilis, MD 12/09/18 [phone]

## 2018-12-08 NOTE — H&P (Signed)
TRH H&P   Patient Demographics:    Margaree Mackintoshdward Sneed, is a 85 y.o. male  MRN: [id number]   DOB - December 05, 1925  Admit Date - 12/08/2018  Outpatient Primary MD for the patient is Joaquim Namuncan, Graham S, MD  Referring MD/NP/PA: Darien RamusAna  Patient coming from: SNF  No chief complaint on file.     HPI:    Margaree Mackintoshdward Chaplin  is a 85 y.o. male, 85 y.o.malewithhistory of CAD status post PCI, diastolic CHF, atrial fibrillation, dementia, chronic kidney disease, chronic anemia , with advanced dementia, exertion secondary to acute renal failure, hyponatremia, he was discharged to SNF, family at bedside providing history, they have noted over the last few days at facility, patient became less talkative, more lethargic, overall has poor oral intake at baseline, which has worsened over the last few days, as the record for last 2 days he has been noncommunicative, closing his eyes most of the day, noncommunicative, apparently he had butterfly inserted over last 24 hours in his abdomen for hydration by SNF, he was sent here for further evaluation. - IN ED it was significantly obtunded, minimally responsive with occasional agitations, cannot provide any symptoms or complaints, his work-up was significant for odium of 169, chloride of more than 130, creatinine of 2.4 from baseline of 1.04, troponin of 0.11, UA was pending, goals of care was discussed with 3 daughters at bedside, and they do confirm she is for comfort measures only, please see discussion below.    Review of systems:    Patient significantly altered, and obtundent unable to provide any history of review of systems  With Past History of the following :    Past Medical History:  Diagnosis Date  . CAD (coronary artery disease)    inferior MI in 2003 w BMS to RCA. DES to CFX in 2008.  Normal Myoviews in 5/13.  Marland Kitchen. CHF (congestive heart failure) (HCC)     diastolic: Echo (4/11) w EF 50-60%, moderate MR, moderate to sever LAE, severe RAE, moderate to sever TR  . CKD (chronic kidney disease)   . Diverticulitis, colon   . GERD (gastroesophageal reflux disease)   . Hyperlipidemia   . Hypertension   . Leg edema    L>R chronically  . Leg pain    and decrease pulses consistent w peripheral vasc disease  . Memory difficulty   . Osteoporosis    s/p 5+ years of fosamax treatment as of 2013  . Paroxysmal atrial fibrillation (HCC)    w history of DCCV  . UTI (urinary tract infection)    with altered mentation 2018      Past Surgical History:  Procedure Laterality Date  . adenosine cardiolite  10/03/02   old infarct inf o/w negative EF 61% 10/03/02  . CARDIAC CATHETERIZATION  1986   normal  . CARDIOVERSION  02/03/10   successful on Mutaq (Dr. Juanda ChanceBrodie)   .  Cath  06/25/09   nonobstructive disease afib (Dr. Juanda Chance)  . Cath, stents  09/27/02  . choleycystectomy  1996  . COLONOSCOPY  12/13/08   mod divertics no polyps (Dr Juanda Chance)  . CT  11/21/03   abd mod H. H., CT pelvis elevated prostate  . CYSTOSCOPY  09/26/03   Vonita Moss  . Echo multiple findings  01/18/10  . ESOPHAGOGASTRODUODENOSCOPY  12/05/03   postiive H-Pylori, H.H., stricture   . FLEXIBLE SIGMOIDOSCOPY  1/98  . hemmoroidectoy    . herniorraphy     x2  . [facility] R/O  01/06/04  . poypectomy    . stent in distal circum  04/17/02      Social History:     Social History   Tobacco Use  . Smoking status: Never Smoker  . Smokeless tobacco: Never Used  Substance Use Topics  . Alcohol use: No      Family History :     Family History  Problem Relation Age of Onset  . Hypertension Mother   . Leukemia Mother   . Lung cancer Brother        smoking  . Heart Problems Brother        smoker  . Deafness Brother       Home Medications:   Prior to Admission medications   Medication Sig Start Date End Date Taking? Authorizing Provider  atorvastatin (LIPITOR) 80 MG tablet  TAKE 1 TABLET BY MOUTH EVERY DAY 10/04/18   End, Cristal Deer, MD  feeding supplement, ENSURE ENLIVE, (ENSURE ENLIVE) LIQD Take 237 mLs by mouth 2 (two) times daily between meals. 11/20/18   Kathlen Mody, MD  ferrous sulfate 325 (65 FE) MG tablet Take 325 mg by mouth daily with breakfast.      [provider]  furosemide (LASIX) 40 MG tablet TAKE 1 TABLET(40 MG) BY MOUTH DAILY 10/19/18   Laurey Morale, MD  Multiple Vitamin (MULTIVITAMIN WITH MINERALS) TABS tablet Take 1 tablet by mouth daily. 11/20/18   Kathlen Mody, MD  nystatin (MYCOSTATIN/NYSTOP) powder Apply topically 3 (three) times daily. 11/20/18   Kathlen Mody, MD  pantoprazole (PROTONIX) 40 MG tablet Take 1 tablet (40 mg total) by mouth 2 (two) times daily before a meal. 11/23/18   Darlin Drop, DO  potassium chloride SA (K-DUR,KLOR-CON) 20 MEQ tablet Take 1 tablet (20 mEq total) by mouth daily. 01/25/18   End, Cristal Deer, MD  QUEtiapine (SEROQUEL) 25 MG tablet Take 1 tablet (25 mg total) by mouth 2 (two) times daily. 11/23/18   Darlin Drop, DO  vitamin B-12 (CYANOCOBALAMIN) 1000 MCG tablet Take 1 tablet (1,000 mcg total) by mouth daily. 05/20/16   Joaquim Nam, MD  XARELTO 15 MG TABS tablet TAKE 1 TABLET(15 MG) BY MOUTH DAILY WITH SUPPER 07/12/18   End, Cristal Deer, MD     Allergies:     Allergies  Allergen Reactions  . Namenda [Memantine Hcl] Other (See Comments)    diarrhea  . Naproxen Other (See Comments)    ABD PAIN, DIARRHEA  . Penicillins Other (See Comments)    Was able to tolerate keflex 08/2017     Physical Exam:   Vitals  Blood pressure (!) 155/139, pulse (!) 109, temperature (!) 97.4 F (36.3 C), temperature source Oral, resp. rate (!) 24, height 5\' 11"  (1.803 m), weight 68.9 kg, SpO2 (!) 80 %.   1. General ridley frail, elderly, built male, laying in bed in mild discomfort  2.  Patient obtunded, does not follow  command, unresponsive, moaning occasionally  3.  Appears to be moving extremities without  gross motor deficits   4.  Does not follow commands, dry oral mucosa  5. Supple Neck, No JVD, No cervical lymphadenopathy appriciated, No Carotid Bruits.  6. Symmetrical Chest wall movement, no wheezing, diminished air entry at the bases  7.  Irregular irregular, no Gallops, Rubs or Murmurs, No Parasternal Heave.  8. Positive Bowel Sounds, Abdomen Soft, has butterfly in abdomen from SNF  9.  No Cyanosis, significantly delayed skin Turgor, No Skin Rash or Bruise.  10.  Difficult muscle wasting, good muscle tone,  joints appear normal , no effusions,     Data Review:    CBC Recent Labs  Lab 12/08/18 1430 12/08/18 1551  WBC 7.6  --   HGB 13.3 12.2*  HCT 45.5 36.0*  PLT 201  --   MCV 108.9*  --   MCH 31.8  --   MCHC 29.2*  --   RDW 16.6*  --   LYMPHSABS 1.0  --   MONOABS 0.5  --   EOSABS 0.1  --   BASOSABS 0.0  --    ------------------------------------------------------------------------------------------------------------------  Chemistries  Recent Labs  Lab 12/08/18 1430 12/08/18 1551  NA 169* 172*  K 5.1 4.5  CL >130*  --   CO2 21*  --   GLUCOSE 101*  --   BUN 72*  --   CREATININE 3.45*  --   CALCIUM 8.8*  --   AST 49*  --   ALT 39  --   ALKPHOS 100  --   BILITOT 1.3*  --    ------------------------------------------------------------------------------------------------------------------ estimated creatinine clearance is 13.9 mL/min (A) (by C-G formula based on SCr of 3.45 mg/dL (H)). ------------------------------------------------------------------------------------------------------------------ No results for input(s): TSH, T4TOTAL, T3FREE, THYROIDAB in the last 72 hours.  Invalid input(s): FREET3  Coagulation profile No results for input(s): INR, PROTIME in the last 168 hours. ------------------------------------------------------------------------------------------------------------------- No results for input(s): DDIMER in the last 72  hours. -------------------------------------------------------------------------------------------------------------------  Cardiac Enzymes No results for input(s): CKMB, TROPONINI, MYOGLOBIN in the last 168 hours.  Invalid input(s): CK ------------------------------------------------------------------------------------------------------------------    Component Value Date/Time   BNP 461.5 (H) 09/04/2017 2040     ---------------------------------------------------------------------------------------------------------------  Urinalysis    Component Value Date/Time   COLORURINE YELLOW 11/14/2018 1801   APPEARANCEUR CLEAR 11/14/2018 1801   LABSPEC 1.014 11/14/2018 1801   PHURINE 5.0 11/14/2018 1801   GLUCOSEU NEGATIVE 11/14/2018 1801   HGBUR MODERATE (A) 11/14/2018 1801   HGBUR large 01/29/2009 1157   BILIRUBINUR NEGATIVE 11/14/2018 1801   BILIRUBINUR neg 01/18/2013 1635   KETONESUR NEGATIVE 11/14/2018 1801   PROTEINUR NEGATIVE 11/14/2018 1801   UROBILINOGEN 0.2 01/18/2013 1635   UROBILINOGEN 0.2 01/29/2009 1157   NITRITE NEGATIVE 11/14/2018 1801   LEUKOCYTESUR NEGATIVE 11/14/2018 1801    ----------------------------------------------------------------------------------------------------------------   Imaging Results:    Dg Chest 2 View  Result Date: 12/08/2018 CLINICAL DATA:  Hypoxia today. EXAM: CHEST - 2 VIEW COMPARISON:  Single-view of the chest 11/14/2018. PA and lateral chest 09/04/2017. FINDINGS: Lungs are clear. Hiatal hernia is noted. Mild elevation of the right hemidiaphragm is unchanged. No pneumothorax or pleural effusion. Aortic atherosclerosis is seen. No acute or focal bony abnormality. IMPRESSION: No acute disease. Cardiomegaly. Atherosclerosis. Hiatal hernia. Electronically Signed   By: Drusilla Kannerhomas  Dalessio M.D.   On: 12/08/2018 14:58     Assessment & Plan:    Active Problems:   B12 deficiency   Essential hypertension   Coronary artery disease  involving  native coronary artery of native heart without angina pectoris   Atrial fibrillation (HCC)   DIASTOLIC HEART FAILURE, ACUTE ON CHRONIC   Memory loss   Chronic kidney disease, stage 3 (HCC)   Altered mental status   ARF (acute renal failure) (HCC)   Acute hypernatremia   Acute hyponatremia -Volume depletion and dehydration, on Lasix, no further work-up  Acute hyperchloremia -Volume depletion and dehydration, no further work-up  Acute hypoxic respiratory failure -Oxygen PRN  AKI on CKD stage III -Due to volume depletion, no further labs or IV fluids  Acute metabolic encephalopathy -Due to hypernatremia with baseline dementia  Chronic diastolic CHF -appears  to be very dry  Atrial fibrillation -No further meds  Hypertension B12 deficiency CAD Dementia at baseline  Palliative discussion: -She is 84 year old, extremely frail, with advanced dementia, with multiple comorbidities, with significant decline of life quality over last few months and weeks per family, with recent hospitalization, and SNF stay, patient never wished to be kept alive on life support, family does not wish for any artificial means to prolong patient's life, and this includes IV fluids, further labs, and other sort of managements, as they do feel he  had a good life, has been suffering recently with a great degree of discomfort, this appear to be very appropriate at this point, so further work-up during hospital stay, our main focus going to be on comfort, with no life prolonging measures, no IV fluids, no labs, further interventions beside comfort medications, will place consult for social worker to refer to [facility], as well will reconsult palliative medicine(they have already evaluated during previous hospital stay)   DVT Prophylaxis: He is comfort care  AM Labs Ordered, also please review Full Orders  Family Communication: Admission, patients condition and plan of care including tests being ordered  have been discussed with patient's 3 daughters at bedside who indicate understanding and agree with the plan and Code Status.  Code Status DNR/Comfort  Likely DC to  Hospice  Condition GUARDED    Consults called: None  Admission status: Observation  Time spent in minutes : 55 MINUTES   Huey Bienenstock M.D on 12/08/2018 at 4:24 PM  Between 7am to 7pm - Pager - [phone]. After 7pm go to [email or web address] - password [facility]  [facility] - Office  [phone]

## 2018-12-08 NOTE — Progress Notes (Signed)
   12/08/18 1730  Clinical Encounter Type  Visited With Patient and family together  Visit Type Initial;Patient actively dying  Referral From Nurse  Consult/Referral To Chaplain  The chaplain responded to RN consult for EOL spiritual care for family in ED.  The chaplain was pastorally present with the Pt. 3 daughters as they shared stories of the Pt. patient's previous years with Dementia and his long marriage to their mother.  The room was a mixture of laughter and some tears as one sister shared with the chaplain.  The chaplain offered F/U spiritual care as needed to the family.

## 2018-12-08 NOTE — ED Notes (Signed)
ED TO INPATIENT HANDOFF REPORT  Name/Age/Gender Tony Reed 85 y.o. male  Code Status    Code Status Orders  (From admission, onward)         Start     Ordered   12/08/18 1620  Do not attempt resuscitation (DNR)  Continuous    Question Answer Comment  In the event of cardiac or respiratory ARREST Do not call a "code blue"   In the event of cardiac or respiratory ARREST Do not perform Intubation, CPR, defibrillation or ACLS   In the event of cardiac or respiratory ARREST Use medication by any route, position, wound care, and other measures to relive pain and suffering. May use oxygen, suction and manual treatment of airway obstruction as needed for comfort.      12/08/18 1621        Code Status History    Date Active Date Inactive Code Status Order ID Comments User Context   11/14/2018 2240 11/23/2018 2058 DNR [id number]  Eduard Clos, MD ED   11/14/2018 2126 11/14/2018 2240 DNR [id number]  Eduard Clos, MD ED   03/09/2012 1738 03/15/2012 1400 Full Code [id number]  Seleta Rhymes, RN Inpatient      Home/SNF/Other Nursing Home  Chief Complaint altered hypoxic  Level of Care/Admitting Diagnosis ED Disposition    ED Disposition Condition Comment   Admit  Hospital Area: [facility] [[id number]]  Level of Care: Med-Surg [16]  I expect the patient will be discharged within 24 hours: Yes  LOW acuity---Tx typically complete <24 hrs---ACUTE conditions typically can be evaluated <24 hours---LABS likely to return to acceptable levels <24 hours---IS near functional baseline---EXPECTED to return to current living arrangement---NOT newly hypoxic: Meets criteria for 5C-Observation unit  Diagnosis: Acute hypernatremia [841660]  Admitting Physician: Chiquita Loth  Attending Physician: Randol Kern, DAWOOD S [[id number]  PT Class (Do Not Modify): Observation [104]  PT Acc Code (Do Not Modify): Observation [10022]       Medical History Past Medical  History:  Diagnosis Date  . CAD (coronary artery disease)    inferior MI in 2003 w BMS to RCA. DES to CFX in 2008.  Normal Myoviews in 5/13.  Marland Kitchen CHF (congestive heart failure) (HCC)    diastolic: Echo (4/11) w EF 50-60%, moderate MR, moderate to sever LAE, severe RAE, moderate to sever TR  . CKD (chronic kidney disease)   . Diverticulitis, colon   . GERD (gastroesophageal reflux disease)   . Hyperlipidemia   . Hypertension   . Leg edema    L>R chronically  . Leg pain    and decrease pulses consistent w peripheral vasc disease  . Memory difficulty   . Osteoporosis    s/p 5+ years of fosamax treatment as of 2013  . Paroxysmal atrial fibrillation (HCC)    w history of DCCV  . UTI (urinary tract infection)    with altered mentation 2018    Allergies Allergies  Allergen Reactions  . Namenda [Memantine Hcl] Other (See Comments)    diarrhea  . Naproxen Other (See Comments)    ABD PAIN, DIARRHEA  . Penicillins Other (See Comments)    Was able to tolerate keflex 08/2017    IV Location/Drains/Wounds Patient Lines/Drains/Airways Status   Active Line/Drains/Airways    None          Labs/Imaging Results for orders placed or performed during the hospital encounter of 12/08/18 (from the past 48 hour(s))  Comprehensive metabolic panel  Status: Abnormal   Collection Time: 12/08/18  2:30 PM  Result Value Ref Range   Sodium 169 (HH) 135 - 145 mmol/L    Comment: CRITICAL RESULT CALLED TO, READ BACK BY AND VERIFIED WITH: M.GORE,RN 1523 12/08/2018 CLARK,S    Potassium 5.1 3.5 - 5.1 mmol/L   Chloride >130 (HH) 98 - 111 mmol/L    Comment: CRITICAL RESULT CALLED TO, READ BACK BY AND VERIFIED WITH: M.GORE,RN 1523 12/08/2018 CLARK,S    CO2 21 (L) 22 - 32 mmol/L   Glucose, Bld 101 (H) 70 - 99 mg/dL   BUN 72 (H) 8 - 23 mg/dL   Creatinine, Ser 5.18 (H) 0.61 - 1.24 mg/dL   Calcium 8.8 (L) 8.9 - 10.3 mg/dL   Total Protein 7.3 6.5 - 8.1 g/dL   Albumin 3.3 (L) 3.5 - 5.0 g/dL   AST 49  (H) 15 - 41 U/L   ALT 39 0 - 44 U/L   Alkaline Phosphatase 100 38 - 126 U/L   Total Bilirubin 1.3 (H) 0.3 - 1.2 mg/dL   GFR calc non Af Amer 15 (L) >60 mL/min   GFR calc Af Amer 17 (L) >60 mL/min   Anion gap NOT CALCULATED 5 - 15    Comment: Performed at [facility], [address], [place], [place] [address]  CBC with Differential     Status: Abnormal   Collection Time: 12/08/18  2:30 PM  Result Value Ref Range   WBC 7.6 4.0 - 10.5 K/uL   RBC 4.18 (L) 4.22 - 5.81 MIL/uL   Hemoglobin 13.3 13.0 - 17.0 g/dL   HCT 06.3 01.6 - 01.0 %   MCV 108.9 (H) 80.0 - 100.0 fL   MCH 31.8 26.0 - 34.0 pg   MCHC 29.2 (L) 30.0 - 36.0 g/dL   RDW 93.2 (H) 35.5 - 73.2 %   Platelets 201 150 - 400 K/uL   nRBC 0.9 (H) 0.0 - 0.2 %   Neutrophils Relative % 79 %   Neutro Abs 6.0 1.7 - 7.7 K/uL   Lymphocytes Relative 14 %   Lymphs Abs 1.0 0.7 - 4.0 K/uL   Monocytes Relative 6 %   Monocytes Absolute 0.5 0.1 - 1.0 K/uL   Eosinophils Relative 1 %   Eosinophils Absolute 0.1 0.0 - 0.5 K/uL   Basophils Relative 0 %   Basophils Absolute 0.0 0.0 - 0.1 K/uL   Immature Granulocytes 0 %   Abs Immature Granulocytes 0.02 0.00 - 0.07 K/uL    Comment: Performed at [facility], [address], [place], [place] [address]  I-Stat Troponin, ED (not at [facility])     Status: Abnormal   Collection Time: 12/08/18  2:30 PM  Result Value Ref Range   Troponin i, poc 0.11 (HH) 0.00 - 0.08 ng/mL   Comment NOTIFIED PHYSICIAN    Comment 3            Comment: Due to the release kinetics of cTnI, a negative result within the first hours of the onset of symptoms does not rule out myocardial infarction with certainty. If myocardial infarction is still suspected, repeat the test at appropriate intervals.   I-STAT 7, (LYTES, BLD GAS, ICA, H+H)     Status: Abnormal   Collection Time: 12/08/18  3:51 PM  Result Value Ref Range   pH, Arterial 7.382 7.350 - 7.450   pCO2 arterial 43.9 32.0 - 48.0 mmHg   pO2, Arterial 436.0 (H)  83.0 - 108.0 mmHg  Bicarbonate 26.1 20.0 - 28.0 mmol/L   TCO2 27 22 - 32 mmol/L   O2 Saturation 100.0 %   Acid-Base Excess 1.0 0.0 - 2.0 mmol/L   Sodium 172 (HH) 135 - 145 mmol/L   Potassium 4.5 3.5 - 5.1 mmol/L   Calcium, Ion 1.18 1.15 - 1.40 mmol/L   HCT 36.0 (L) 39.0 - 52.0 %   Hemoglobin 12.2 (L) 13.0 - 17.0 g/dL   Patient temperature HIDE    Collection site RADIAL, ALLEN'S TEST ACCEPTABLE    Drawn by RT    Sample type ARTERIAL    Comment NOTIFIED PHYSICIAN    Dg Chest 2 View  Result Date: 12/08/2018 CLINICAL DATA:  Hypoxia today. EXAM: CHEST - 2 VIEW COMPARISON:  Single-view of the chest 11/14/2018. PA and lateral chest 09/04/2017. FINDINGS: Lungs are clear. Hiatal hernia is noted. Mild elevation of the right hemidiaphragm is unchanged. No pneumothorax or pleural effusion. Aortic atherosclerosis is seen. No acute or focal bony abnormality. IMPRESSION: No acute disease. Cardiomegaly. Atherosclerosis. Hiatal hernia. Electronically Signed   By: Drusilla Kannerhomas  Dalessio M.D.   On: 12/08/2018 14:58    Pending Labs Unresulted Labs (From admission, onward)   None      Vitals/Pain Today's Vitals   12/08/18 1516 12/08/18 1519 12/08/18 1530 12/08/18 1618  BP: (!) 155/139 (!) 155/139 116/68   Pulse:  (!) 109    Resp: (!) 23 (!) 24 (!) 21   Temp:    (!) 97.4 F (36.3 C)  TempSrc:    Oral  SpO2:  (!) 80%    Weight:      Height:        Isolation Precautions No active isolations  Medications Medications  sodium chloride 0.9 % bolus 1,000 mL (has no administration in time range)  morphine CONCENTRATE 10 MG/0.5ML oral solution 5 mg (has no administration in time range)    Or  morphine CONCENTRATE 10 MG/0.5ML oral solution 5 mg (has no administration in time range)  HYDROmorphone (DILAUDID) injection 0.5 mg (0.5 mg Intramuscular Given 12/08/18 1656)  LORazepam (ATIVAN) tablet 1 mg (has no administration in time range)    Or  LORazepam (ATIVAN) 2 MG/ML concentrated solution 1 mg (has  no administration in time range)    Or  LORazepam (ATIVAN) injection 1 mg (has no administration in time range)  diphenhydrAMINE (BENADRYL) injection 12.5 mg (has no administration in time range)  glycopyrrolate (ROBINUL) tablet 1 mg (has no administration in time range)    Or  glycopyrrolate (ROBINUL) injection 0.2 mg (has no administration in time range)    Or  glycopyrrolate (ROBINUL) injection 0.2 mg (has no administration in time range)  polyvinyl alcohol (LIQUIFILM TEARS) 1.4 % ophthalmic solution 1 drop (has no administration in time range)    Mobility non-ambulatory

## 2018-12-08 NOTE — Progress Notes (Signed)
IV RN went to start IV and family stated that they were making the patient comfortable and didn't want to start and IV at this time.

## 2018-12-08 NOTE — ED Notes (Signed)
Attempted report x 2 

## 2018-12-08 NOTE — ED Triage Notes (Signed)
[facility]- Pt brought in from [facility]. Pt was recently diagnosed with pneumonia. Pt was sent here for hypoxia. Pt placed on NRB by EMS.

## 2018-12-08 NOTE — ED Notes (Signed)
Patient transported to X-ray 

## 2018-12-08 NOTE — ED Notes (Signed)
Attempted report x1. 

## 2018-12-09 ENCOUNTER — Telehealth: Payer: Self-pay | Admitting: Family Medicine

## 2018-12-09 ENCOUNTER — Encounter (HOSPITAL_COMMUNITY): Payer: Self-pay

## 2018-12-09 DIAGNOSIS — Z7189 Other specified counseling: Secondary | ICD-10-CM | POA: Diagnosis not present

## 2018-12-09 DIAGNOSIS — M2550 Pain in unspecified joint: Secondary | ICD-10-CM | POA: Diagnosis not present

## 2018-12-09 DIAGNOSIS — F0151 Vascular dementia with behavioral disturbance: Secondary | ICD-10-CM | POA: Diagnosis not present

## 2018-12-09 DIAGNOSIS — E870 Hyperosmolality and hypernatremia: Secondary | ICD-10-CM | POA: Diagnosis not present

## 2018-12-09 DIAGNOSIS — Z515 Encounter for palliative care: Secondary | ICD-10-CM | POA: Diagnosis not present

## 2018-12-09 DIAGNOSIS — R0902 Hypoxemia: Secondary | ICD-10-CM | POA: Diagnosis not present

## 2018-12-09 DIAGNOSIS — R52 Pain, unspecified: Secondary | ICD-10-CM | POA: Diagnosis not present

## 2018-12-09 DIAGNOSIS — F01518 Vascular dementia, unspecified severity, with other behavioral disturbance: Secondary | ICD-10-CM

## 2018-12-09 DIAGNOSIS — Z7401 Bed confinement status: Secondary | ICD-10-CM | POA: Diagnosis not present

## 2018-12-09 DIAGNOSIS — R4182 Altered mental status, unspecified: Secondary | ICD-10-CM | POA: Diagnosis not present

## 2018-12-09 MED ORDER — MORPHINE SULFATE (CONCENTRATE) 10 MG/0.5ML PO SOLN: 10.0000 mg | ORAL | 0 refills | Status: AC | PRN

## 2018-12-09 MED ORDER — LORAZEPAM 2 MG/ML PO CONC: 1.0000 mg | Freq: Four times a day (QID) | ORAL | 0 refills | Status: AC | PRN

## 2018-12-09 MED ORDER — DIPHENHYDRAMINE HCL 50 MG/ML IJ SOLN: 12.5000 mg | INTRAMUSCULAR | Status: DC | PRN

## 2018-12-09 MED ORDER — MORPHINE SULFATE (PF) 2 MG/ML IV SOLN: 1.0000 mg | INTRAVENOUS | Status: DC | PRN

## 2018-12-09 MED ORDER — HYDROMORPHONE HCL 1 MG/ML IJ SOLN: 0.5000 mg | INTRAMUSCULAR | Status: DC | PRN

## 2018-12-09 NOTE — Discharge Summary (Signed)
JAELEN MCELMURRAY XMI:[id number] DOB: [birth year] DOA: 12/08/2018  PCP: Joaquim Nam, MD  Admit date: 12/08/2018  Discharge date: 12/09/2018  Admitted From: Home   Disposition:  Residential Hospice   Recommendations for Outpatient Follow-up:   Follow up with PCP in 1-2 weeks  PCP Please obtain BMP/CBC, 2 view CXR in 1week,  (see Discharge instructions)   PCP Please follow up on the following pending results:    Home Health: None   Equipment/Devices: None  Consultations: Pall. Care Discharge Condition: Guarded   CODE STATUS: DNR   Diet Recommendation: Soft diet for comfort feeding only with feeding assistance, high risk for aspiration  Diet Order            Diet NPO time specified  Diet effective now               CC  - dehydration   Brief history of present illness from the day of admission and additional interim summary     Khadeem Paola  is a 85 y.o. male, 85 y.o.malewithhistory of CAD status post PCI, diastolic CHF, atrial fibrillation, dementia, chronic kidney disease, chronic anemia , with advanced dementia, exertion secondary to acute renal failure, hyponatremia, he was discharged to SNF, family at bedside providing history, they have noted over the last few days at facility, patient became less talkative, more lethargic, overall has poor oral intake at baseline, which has worsened over the last few days, as the record for last 2 days he has been noncommunicative, closing his eyes most of the day, noncommunicative, apparently he had butterfly inserted over last 24 hours in his abdomen for hydration by SNF, he was sent here for further evaluation.  IN ED it was significantly obtunded, minimally responsive with occasional agitations, cannot provide any symptoms or complaints, his work-up  was significant for odium of 169, chloride of more than 130, creatinine of 2.4 from baseline of 1.04, troponin of 0.11, UA was pending, goals of care was discussed with 3 daughters at bedside, and they do confirm she is for comfort measures only.   Care was consulted and it is decided that patient will be discharged residential hospice once bed is available with goal of care being comfort only.  All non-comfort medications will be stopped.  Likely to pass away soon.     Discharge diagnosis     Active Problems:   B12 deficiency   Essential hypertension   Coronary artery disease involving native coronary artery of native heart  without angina pectoris   Atrial fibrillation (HCC)   DIASTOLIC HEART FAILURE, ACUTE ON CHRONIC   Memory loss   Terminal care   Chronic kidney disease, stage 3 (HCC)   Altered mental status   ARF (acute renal failure) (HCC)   Acute hypernatremia   Vascular dementia with behavior disturbance [facility])   Palliative care by specialist    Discharge instructions    Discharge Instructions    Discharge instructions   Complete by:  As directed    Disposition.  Residential hospice Condition.  Guarded CODE STATUS.  DNR Activity.  With assistance as tolerated, full fall precautions. Diet.  Soft with feeding assistance and aspiration precautions. Goal of care.  Comfort.      Discharge Medications   Allergies as of 12/09/2018      Reactions   Namenda [memantine Hcl] Diarrhea   "Allergic," per MAR   Naproxen Diarrhea, Other (See Comments)   Abdominal pain, also ("allergic," per [facility])   Penicillins Rash   Was able to tolerate keflex 08/2017 Did it involve swelling of the face/tongue/throat, SOB, or low BP? Possible rash, per family ?? Did it involve sudden or severe rash/hives, skin peeling, or any reaction on the inside of your mouth or nose? Unk Did you need to seek medical attention at a hospital or doctor's office? Unk When did it last happen? Unk If all  above answers are "NO", may proceed with cephalosporin use.      Medication List    STOP taking these medications   atorvastatin 80 MG tablet Commonly known as:  LIPITOR   ENSURE PLUS Liqd   feeding supplement (ENSURE ENLIVE) Liqd   ferrous sulfate 325 (65 FE) MG tablet   furosemide 40 MG tablet Commonly known as:  LASIX   LORazepam 0.5 MG tablet Commonly known as:  ATIVAN Replaced by:  LORazepam 2 MG/ML concentrated solution   multivitamin with minerals Tabs tablet   nystatin powder Commonly known as:  MYCOSTATIN/NYSTOP   pantoprazole 40 MG tablet Commonly known as:  PROTONIX   potassium chloride SA 20 MEQ tablet Commonly known as:  K-DUR,KLOR-CON   XARELTO 15 MG Tabs tablet Generic drug:  Rivaroxaban     TAKE these medications   DERMACLOUD Crea Apply 1 application topically See admin instructions. Apply to sacrum 2 times a day- day and evening shifts   divalproex 125 MG capsule Commonly known as:  DEPAKOTE SPRINKLE Take 125 mg by mouth at bedtime.   hydrocortisone 2.5 % cream Apply 1 application topically See admin instructions. Apply to bilateral calves 2 times a day- day and evening shifts for 10 days   LORazepam 2 MG/ML concentrated solution Commonly known as:  ATIVAN Take 0.5 mLs (1 mg total) by mouth every 6 (six) hours as needed for anxiety. Replaces:  LORazepam 0.5 MG tablet   morphine CONCENTRATE 10 MG/0.5ML Soln concentrated solution Take 0.5 mLs (10 mg total) by mouth every 3 (three) hours as needed for moderate pain or severe pain.   QUEtiapine 25 MG tablet Commonly known as:  SEROQUEL Take 1 tablet (25 mg total) by mouth 2 (two) times daily.   vitamin B-12 1000 MCG tablet Commonly known as:  CYANOCOBALAMIN Take 1 tablet (1,000 mcg total) by mouth daily.         Major procedures and Radiology Reports - PLEASE review detailed and final reports thoroughly  -        Dg Chest 2 View  Result Date: 12/08/2018 CLINICAL DATA:  Hypoxia  today. EXAM: CHEST - 2 VIEW COMPARISON:  Single-view of the chest 11/14/2018. PA and lateral chest 09/04/2017. FINDINGS: Lungs are clear. Hiatal hernia is noted. Mild elevation of the right hemidiaphragm is unchanged. No pneumothorax or pleural effusion. Aortic atherosclerosis is seen. No acute or focal bony abnormality. IMPRESSION: No acute disease. Cardiomegaly. Atherosclerosis. Hiatal hernia. Electronically Signed   By: Drusilla Kanner M.D.   On: 12/08/2018 14:58   Ct Head Wo Contrast  Result Date: 11/14/2018 CLINICAL DATA:  Altered level of consciousness. EXAM: CT HEAD WITHOUT CONTRAST TECHNIQUE: Contiguous axial images were obtained from the base of the skull through the vertex without intravenous contrast. COMPARISON:  09/04/2017 FINDINGS: Brain: There is no evidence for acute hemorrhage, hydrocephalus, mass lesion, or abnormal extra-axial fluid collection. No definite CT evidence for acute infarction. Diffuse loss of parenchymal volume is consistent with atrophy. Patchy low attenuation in the deep hemispheric and periventricular white matter is nonspecific, but likely reflects chronic microvascular ischemic demyelination. Vascular: No hyperdense vessel or unexpected calcification. Skull: No evidence for fracture. No worrisome lytic or sclerotic lesion. Sinuses/Orbits: The visualized paranasal sinuses and right mastoid air cells are clear. Fluid noted in posterior left mastoid air cells. Visualized portions of the globes and intraorbital fat are unremarkable. Other: None. IMPRESSION: 1. No acute intracranial abnormality. 2. Atrophy with chronic small vessel white matter ischemic disease. Electronically Signed   By: Kennith Center M.D.   On: 11/14/2018 18:31   Dg Chest Portable 1 View  Result Date: 11/14/2018 CLINICAL DATA:  Dementia.  Decreased appetite. EXAM: PORTABLE CHEST 1 VIEW COMPARISON:  09/04/2017 FINDINGS: Right lung is clear. Patchy airspace disease noted left base. Interstitial markings are  diffusely coarsened with chronic features. The cardio pericardial silhouette is enlarged. Bones are diffusely demineralized. Face obscures the lung apices. IMPRESSION: Probable emphysema with left base patchy airspace disease suggesting pneumonia. Electronically Signed   By: Kennith Center M.D.   On: 11/14/2018 17:56    Micro Results     No results found for this or any previous visit (from the past 240 hour(s)).  Today   Subjective    Margaree Mackintosh today in bed in no distress but unable to answer questions or follow commands, does open eyes to sternal rub   Objective   Blood pressure 95/73, pulse 85, temperature (!) 97.4 F (36.3 C), temperature source Oral, resp. rate (!) 21, height 5\' 11"  (1.803 m), weight 68.9 kg, SpO2 (!) 87 %.   Intake/Output Summary (Last 24 hours) at 12/09/2018 1039 Last data filed at 12/09/2018 0930 Gross per 24 hour  Intake 0 ml  Output -  Net 0 ml    Exam  Frail elderly cachectic white male lying in hospital bed in no discomfort but appears extremely fatigued and tired [place].AT,PERRAL Supple Neck,No JVD, No cervical lymphadenopathy appriciated.  Symmetrical Chest wall movement, Good air movement bilaterally, CTAB RRR,No Gallops,Rubs or new Murmurs, No Parasternal Heave +ve B.Sounds, Abd Soft, Non tender, No organomegaly appriciated, No rebound -guarding or rigidity. No Cyanosis, Clubbing or edema, No new Rash or bruise   Data Review   CBC w Diff:  Lab Results  Component Value Date   WBC 7.6 12/08/2018   HGB 12.2 (L) 12/08/2018   HGB 11.7 (L) 09/04/2018   HCT 36.0 (L) 12/08/2018   HCT 35.6 (L) 09/04/2018   PLT 201 12/08/2018   PLT 168 09/04/2018   LYMPHOPCT 14 12/08/2018   MONOPCT 6 12/08/2018   EOSPCT 1 12/08/2018   BASOPCT 0 12/08/2018  CMP:  Lab Results  Component Value Date   NA 172 (HH) 12/08/2018   NA 141 09/04/2018   K 4.5 12/08/2018   CL >130 (HH) 12/08/2018   CO2 21 (L) 12/08/2018   BUN 72 (H) 12/08/2018   BUN 22  09/04/2018   CREATININE 3.45 (H) 12/08/2018   CREATININE 1.51 (H) 08/17/2016   PROT 7.3 12/08/2018   PROT 6.9 09/04/2018   ALBUMIN 3.3 (L) 12/08/2018   ALBUMIN 4.3 09/04/2018   BILITOT 1.3 (H) 12/08/2018   BILITOT 0.5 09/04/2018   ALKPHOS 100 12/08/2018   AST 49 (H) 12/08/2018   ALT 39 12/08/2018  .   Total Time in preparing paper work, data evaluation and todays exam - 35 minutes  Susa RaringPrashant Singh M.D on 12/09/2018 at 10:39 AM  [facility]   Office  [phone]

## 2018-12-09 NOTE — Consult Note (Signed)
Consultation Note Date: 12/09/2018   Patient Name: Tony Reed  DOB: 05-05-1926  MRN: [id number]  Age / Sex: 85 y.o., male  PCP: Joaquim Nam, MD Referring Physician: Leroy Sea, MD  Reason for Consultation: Establishing goals of care, Inpatient hospice referral, Non pain symptom management, Pain control, Psychosocial/spiritual support and Terminal Care  HPI/Patient Profile: 85 y.o. male  with past medical history of coronary artery disease status post PCI, CHF, atrial fib, advanced dementia, chronic kidney disease, anemia of chronic disease admitted on 12/08/2018 with altered mental status.  Patient has recently been admitted to SNF.  He was found to be hypernatremic, with acute kidney injury upon admission.  He has had a significant functional decline per family over the past several months.  When he presented to the emergency room, he was obtunded.  Sodium 169, creatinine 2.4 (baseline 1.04).  Patient was recently seen by palliative medicine provider, Dr. Romie Minus in January 2020.  ED providers discussed with patient's 3 daughters patient's current clinical condition.  Family has elected comfort care at this time and are hopeful for transition to residential hospice  Consult ordered for symptom management as well as residential hospice  .   Clinical Assessment and Goals of Care: Patient seen, chart reviewed.  No family at the bedside currently.  He is exhibiting mild restlessness, grimacing.  MAR reviewed.  Patient is nonverbal.   Sherron Monday to 1 of patient's daughters this morning, Zella Ball, to update her on her father's clinical condition.  I assured her that her father was comfortable.  She shared some stories regarding her father as well as her mother.  She understands that her father is nearing end of life and confirms her desire for [facility] if possible  Patient has 3 daughters. His   primary emergency spokesperson is, daughter, Shellia Cleverly, [phone]    SUMMARY OF RECOMMENDATIONS   DNR/DNI Comfort care Have called hospital liaison with [facility] to follow-up on bed availability at [facility].  Awaiting return call Spoke to family.  Confirmed their desire for [facility] as well as comfort care  Code Status/Advance Care Planning:  DNR    Symptom Management:   Pain: We will DC hydromorphone.  Morphine 1 to 2 mg subcu every 2 hours as needed for pain; also morphine concentrate available.  As needed for now.  Monitor and titrate for effect as well as need for scheduled dosing  Secretions: Continue with Robinul as needed  Anxiety: Continue with Ativan as needed  Palliative Prophylaxis:   Aspiration, Bowel Regimen, Delirium Protocol, Eye Care, Frequent Pain Assessment, Oral Care and Turn Reposition  Additional Recommendations (Limitations, Scope, Preferences):  Full Comfort Care  Psycho-social/Spiritual:   Desire for further Chaplaincy support:no  Additional Recommendations: Referral to Community Resources   Prognosis:   Hours - Days in the setting of coronary artery disease, congestive heart failure, atrial fib, advanced dementia.  Patient is no longer taking anything by mouth  Discharge Planning: Hospice facility      Primary Diagnoses:  Present on Admission: . Acute hypernatremia . Altered mental status . ARF (acute renal failure) (HCC) . Atrial fibrillation (HCC) . B12 deficiency . Chronic kidney disease, stage 3 (HCC) . Coronary artery disease involving native coronary artery of native heart without angina pectoris . DIASTOLIC HEART FAILURE, ACUTE ON CHRONIC . Essential hypertension . Memory loss   I have reviewed the medical record, interviewed the patient and family, and examined the patient. The following aspects are pertinent.  Past Medical History:  Diagnosis Date  . CAD (coronary artery disease)    inferior MI in 2002-03-06 w BMS to  RCA. DES to CFX in 2007-03-07.  Normal Myoviews in 5/13.  Marland Kitchen CHF (congestive heart failure) (HCC)    diastolic: Echo (4/11) w EF 50-60%, moderate MR, moderate to sever LAE, severe RAE, moderate to sever TR  . CKD (chronic kidney disease)   . Diverticulitis, colon   . GERD (gastroesophageal reflux disease)   . Hyperlipidemia   . Hypertension   . Leg edema    L>R chronically  . Leg pain    and decrease pulses consistent w peripheral vasc disease  . Memory difficulty   . Osteoporosis    s/p 5+ years of fosamax treatment as of Mar 06, 2012  . Paroxysmal atrial fibrillation (HCC)    w history of DCCV  . UTI (urinary tract infection)    with altered mentation 2017-03-06   Social History   Socioeconomic History  . Marital status: Married    Spouse name: Not on file  . Number of children: 6  . Years of education: Not on file  . Highest education level: Not on file  Occupational History    Employer: RETIRED  Social Needs  . Financial resource strain: Not on file  . Food insecurity:    Worry: Not on file    Inability: Not on file  . Transportation needs:    Medical: Not on file    Non-medical: Not on file  Tobacco Use  . Smoking status: Never Smoker  . Smokeless tobacco: Never Used  Substance and Sexual Activity  . Alcohol use: No  . Drug use: No  . Sexual activity: Yes  Lifestyle  . Physical activity:    Days per week: Not on file    Minutes per session: Not on file  . Stress: Not on file  Relationships  . Social connections:    Talks on phone: Not on file    Gets together: Not on file    Attends religious service: Not on file    Active member of club or organization: Not on file    Attends meetings of clubs or organizations: Not on file    Relationship status: Not on file  Other Topics Concern  . Not on file  Social History Narrative   Retired. Lives w/son in [place].    Wife died Mar 06, 2017- due to dementia, patient may not recall that his wife has died   Family History  Problem  Relation Age of Onset  . Hypertension Mother   . Leukemia Mother   . Lung cancer Brother        smoking  . Heart Problems Brother        smoker  . Deafness Brother    Scheduled Meds: Continuous Infusions: . sodium chloride     PRN Meds:.diphenhydrAMINE, glycopyrrolate **OR** glycopyrrolate **OR** glycopyrrolate, LORazepam **OR** LORazepam **OR** LORazepam, morphine injection, morphine CONCENTRATE **OR** morphine CONCENTRATE, polyvinyl alcohol Medications Prior to Admission:  Prior to Admission medications  Medication Sig Start Date End Date Taking? Authorizing Provider  atorvastatin (LIPITOR) 80 MG tablet TAKE 1 TABLET BY MOUTH EVERY DAY Patient taking differently: Take 80 mg by mouth daily.  10/04/18  Yes End, Cristal Deer, MD  divalproex (DEPAKOTE SPRINKLE) 125 MG capsule Take 125 mg by mouth at bedtime.   Yes [provider]  ENSURE PLUS (ENSURE PLUS) LIQD Take 237 mLs by mouth 2 (two) times daily.   Yes [provider]  ferrous sulfate 325 (65 FE) MG tablet Take 325 mg by mouth daily with breakfast.     Yes [provider]  furosemide (LASIX) 40 MG tablet TAKE 1 TABLET(40 MG) BY MOUTH DAILY Patient taking differently: Take 40 mg by mouth daily.  10/19/18  Yes Laurey Morale, MD  hydrocortisone 2.5 % cream Apply 1 application topically See admin instructions. Apply to bilateral calves 2 times a day- day and evening shifts for 10 days 12/07/18 12/16/18 Yes [provider]  Infant Care Products (DERMACLOUD) CREA Apply 1 application topically See admin instructions. Apply to sacrum 2 times a day- day and evening shifts   Yes [provider]  LORazepam (ATIVAN) 0.5 MG tablet Take 0.5 mg by mouth every 12 (twelve) hours as needed ("for behaviors," per [facility]- for 14 days). 11/28/18 12/11/18 Yes [provider]  Multiple Vitamin (MULTIVITAMIN WITH MINERALS) TABS tablet Take 1 tablet by mouth daily. 11/20/18  Yes Kathlen Mody, MD  nystatin  (MYCOSTATIN/NYSTOP) powder Apply topically 3 (three) times daily. Patient taking differently: Apply topically every 8 (eight) hours as needed (to foreskin- for yeast).  11/20/18  Yes Kathlen Mody, MD  pantoprazole (PROTONIX) 40 MG tablet Take 1 tablet (40 mg total) by mouth 2 (two) times daily before a meal. 11/23/18  Yes Hall, Carole N, DO  potassium chloride SA (K-DUR,KLOR-CON) 20 MEQ tablet Take 1 tablet (20 mEq total) by mouth daily. Patient taking differently: Take 40 mEq by mouth daily.  01/25/18  Yes End, Cristal Deer, MD  vitamin B-12 (CYANOCOBALAMIN) 1000 MCG tablet Take 1 tablet (1,000 mcg total) by mouth daily. 05/20/16  Yes Joaquim Nam, MD  XARELTO 15 MG TABS tablet TAKE 1 TABLET(15 MG) BY MOUTH DAILY WITH SUPPER Patient taking differently: Take 15 mg by mouth daily with supper.  07/12/18  Yes End, Cristal Deer, MD  feeding supplement, ENSURE ENLIVE, (ENSURE ENLIVE) LIQD Take 237 mLs by mouth 2 (two) times daily between meals. Patient not taking: Reported on 12/08/2018 11/20/18   Kathlen Mody, MD  QUEtiapine (SEROQUEL) 25 MG tablet Take 1 tablet (25 mg total) by mouth 2 (two) times daily. Patient not taking: Reported on 12/08/2018 11/23/18   Darlin Drop, DO   Allergies  Allergen Reactions  . Namenda [Memantine Hcl] Diarrhea    "Allergic," per MAR  . Naproxen Diarrhea and Other (See Comments)    Abdominal pain, also ("allergic," per [facility])  . Penicillins Rash    Was able to tolerate keflex 08/2017 Did it involve swelling of the face/tongue/throat, SOB, or low BP? Possible rash, per family ?? Did it involve sudden or severe rash/hives, skin peeling, or any reaction on the inside of your mouth or nose? Unk Did you need to seek medical attention at a hospital or doctor's office? Unk When did it last happen? Unk If all above answers are "NO", may proceed with cephalosporin use.    Review of Systems  Unable to perform ROS: Acuity of condition    Physical Exam Vitals signs and nursing  note reviewed.  Constitutional:      Comments: Minimally responsive; nonverbal Transitioning  HENT:     Head: Normocephalic and atraumatic.  Cardiovascular:     Rate and Rhythm: Rhythm irregular.  Pulmonary:     Comments: Irregular but unlabored Skin:    Findings: Bruising present.     Comments: Cool Faint mottling to feet  Neurological:     Comments: Unable to test. Nonverbal. Cannot FC  Psychiatric:     Comments: Mild restlessness, otherwise unable to test     Vital Signs: BP 95/73 (BP Location: Left Arm)   Pulse 85   Temp (!) 97.4 F (36.3 C) (Oral)   Resp (!) 21   Ht 5\' 11"  (1.803 m)   Wt 68.9 kg   SpO2 (!) 87%   BMI 21.19 kg/m  Pain Scale: 0-10   Pain Score: 0-No pain   SpO2: SpO2: (!) 87 % O2 Device:SpO2: (!) 87 % O2 Flow Rate: .O2 Flow Rate (L/min): 2 L/min  IO: Intake/output summary: No intake or output data in the 24 hours ending 12/09/18 0858  LBM:   Baseline Weight: Weight: 68.9 kg Most recent weight: Weight: 68.9 kg     Palliative Assessment/Data:   Flowsheet Rows     Most Recent Value  Intake Tab  Referral Department  Hospitalist  Unit at Time of Referral  Med/Surg Unit  Palliative Care Primary Diagnosis  Other (Comment)  Date Notified  12/08/18  Palliative Care Type  Return patient Palliative Care  Reason for referral  Pain, Non-pain Symptom, Psychosocial or Spiritual support  Date of Admission  12/08/18  Date first seen by Palliative Care  12/09/18  # of days Palliative referral response time  1 Day(s)  # of days IP prior to Palliative referral  0  Clinical Assessment  Palliative Performance Scale Score  20%  Pain Max last 24 hours  Not able to report  Pain Min Last 24 hours  Not able to report  Dyspnea Max Last 24 Hours  Not able to report  Dyspnea Min Last 24 hours  Not able to report  Nausea Max Last 24 Hours  Not able to report  Nausea Min Last 24 Hours  Not able to report  Anxiety Max Last 24 Hours  Not able to report  Anxiety  Min Last 24 Hours  Not able to report  Other Max Last 24 Hours  Not able to report  Psychosocial & Spiritual Assessment  Palliative Care Outcomes  Patient/Family meeting held?  Yes  Patient/Family wishes: Interventions discontinued/not started   Mechanical Ventilation, BiPAP, Hemodialysis, Transfusion, Vasopressors, NIPPV, Trach, Antibiotics, PEG, Tube feedings/TPN  Palliative Care follow-up planned  Yes, Facility      Time In: 0800 Time Out: 0910 Time Total: 70 min Greater than 50%  of this time was spent counseling and coordinating care related to the above assessment and plan. Updated Dr. Thedore Mins as well as bedside RN  Signed by: Irean Hong, NP   Please contact Palliative Medicine Team phone at [phone] for questions and concerns.  For individual provider: See Loretha Stapler

## 2018-12-09 NOTE — Telephone Encounter (Signed)
Called his daughter about recent events.  Offered support.  She thanked me for the call.  I appreciate the help of all involved.

## 2018-12-09 NOTE — Progress Notes (Signed)
Patient will Discharge To:[facility] Anticipated DC Date:12/09/2018 Family Notified:yes, Shellia Cleverly daughter, [phone] Transport EZ:[id number]   Per MD patient ready for DC to [facility] . RN, patient, patient's family, and facility notified of DC. Assessment, Fl2/Pasrr, and Discharge Summary sent to facility. RN given number for report [phone]). DC packet on chart. Ambulance transport requested for patient.   CSW signing off.  Budd Palmer LCSWA [phone]

## 2018-12-09 NOTE — Progress Notes (Signed)
[facility]) hospital liaison note.  Received request from Romona Curls, NP  for family interest in [facility] with request for transfer today . Chart reviewed and eligibility confirmed. Met with family to confirm interest and explain services. Family agreeable to transfer today. CSW aware.  Registration paper work completed. Dr. Orpah Melter to assume care per family request.  Please fax discharge summary to [phone]. RN please call report to [phone]. Please arrange transport for patient to arrive before noon if possible.  Thank you,   General Wearing, RN, [facility]  [place] are on [place]

## 2018-12-09 NOTE — Plan of Care (Signed)
  Problem: Education: Goal: Knowledge of General Education information will improve Description: Including pain rating scale, medication(s)/side effects and non-pharmacologic comfort measures Outcome: Adequate for Discharge   Problem: Health Behavior/Discharge Planning: Goal: Ability to manage health-related needs will improve Outcome: Adequate for Discharge   Problem: Clinical Measurements: Goal: Ability to maintain clinical measurements within normal limits will improve Outcome: Adequate for Discharge Goal: Will remain free from infection Outcome: Adequate for Discharge Goal: Diagnostic test results will improve Outcome: Adequate for Discharge Goal: Respiratory complications will improve Outcome: Adequate for Discharge Goal: Cardiovascular complication will be avoided Outcome: Adequate for Discharge   Problem: Pain Managment: Goal: General experience of comfort will improve Outcome: Adequate for Discharge   

## 2018-12-09 NOTE — Discharge Instructions (Signed)
Disposition.  [facility] hospice °Condition.  Guarded °CODE STATUS.  DNR °Activity.  With assistance as tolerated, full fall precautions. °Diet.  Soft with feeding assistance and aspiration precautions. °Goal of care.  Comfort. ° °

## 2018-12-17 DEATH — deceased

## 2019-03-02 ENCOUNTER — Telehealth: Payer: Self-pay | Admitting: Family Medicine

## 2019-03-02 NOTE — Telephone Encounter (Signed)
Patient is deceased. He was buried Jan 03, 2019. Son received a call today. Could we mark his chart he is deceased. Thanks.

## 2019-03-02 NOTE — Telephone Encounter (Signed)
Updated chart. Not sure who called.

## 2019-03-02 NOTE — Telephone Encounter (Signed)
I called the deceased patient's daughter Zella Ball to apologize and she understood.  I think this was an automatic call.  This should be corrected since the chart is marked as deceased.  She'll pass my message along, she was really gracious about this.  I thanked her for taking the call.

## 2020-01-25 IMAGING — CT CT HEAD W/O CM
3 series · 15 of 47 positions shown, 18 images · non-contrast
Comparison: 09/04/2017

CLINICAL DATA: Altered level of consciousness.

EXAM:
CT HEAD WITHOUT CONTRAST
TECHNIQUE: Contiguous axial images were obtained from the base of the skull
through the vertex without intravenous contrast.

[Series 2: head wo · axial · 0.46mm/px · z∈[-164,-39]mm · 9 of 31 slices shown, 12 images]
[im 3/31  brain]
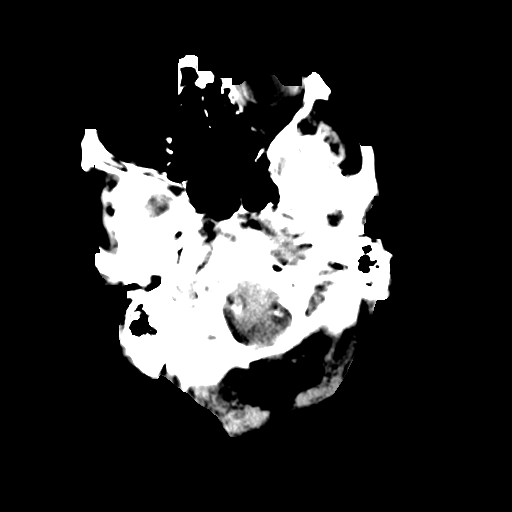
[im 3/31  bone]
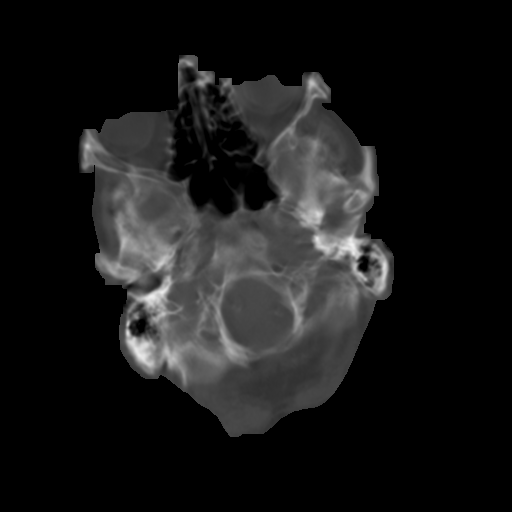
[im 6/31  brain]
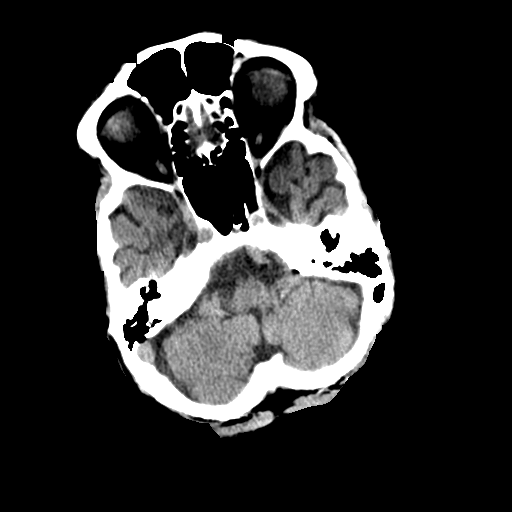
[im 9/31  brain]
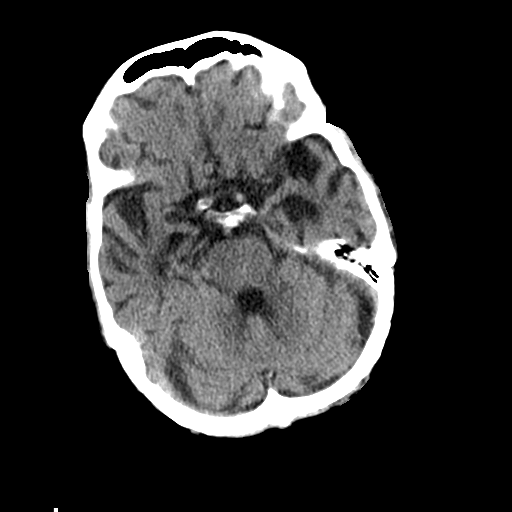
[im 12/31  brain]
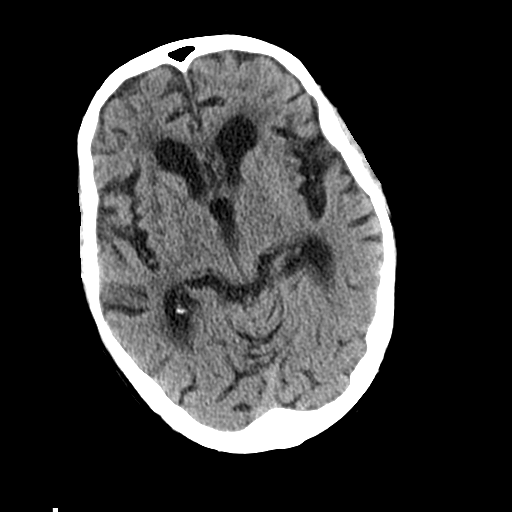
[im 16/31  brain]
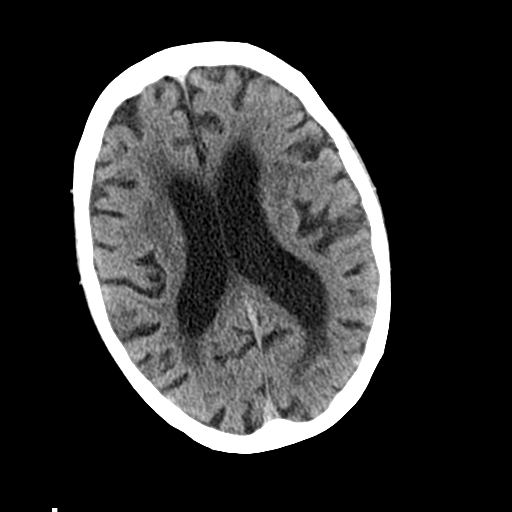
[im 16/31  bone]
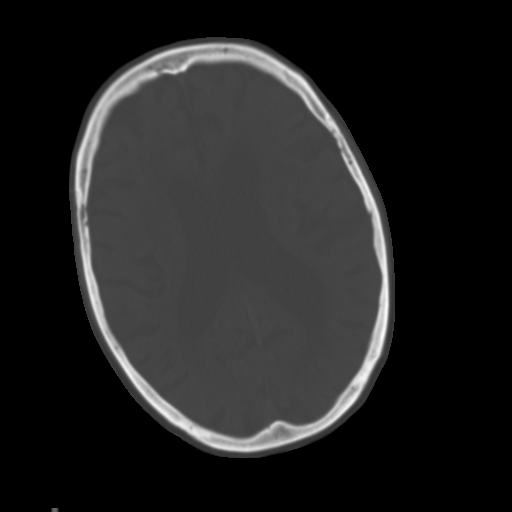
[im 19/31  brain]
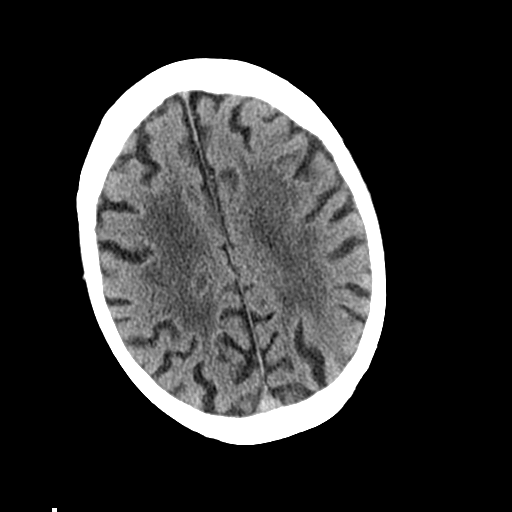
[im 22/31  brain]
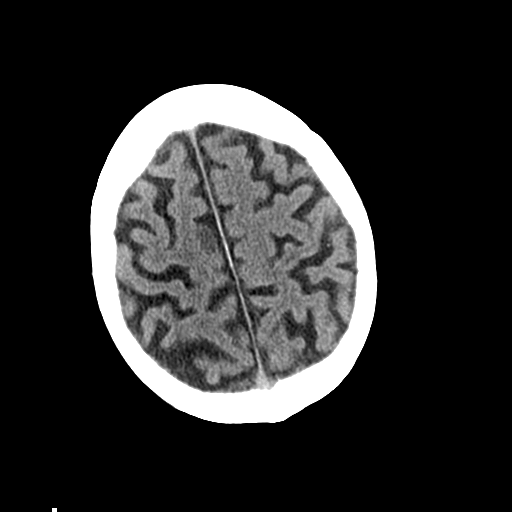
[im 25/31  brain]
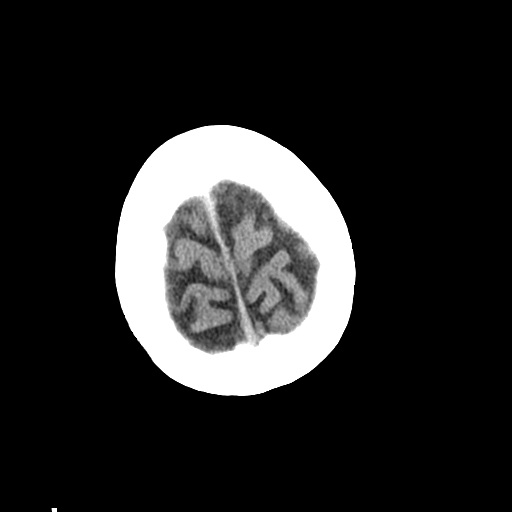
[im 28/31  brain]
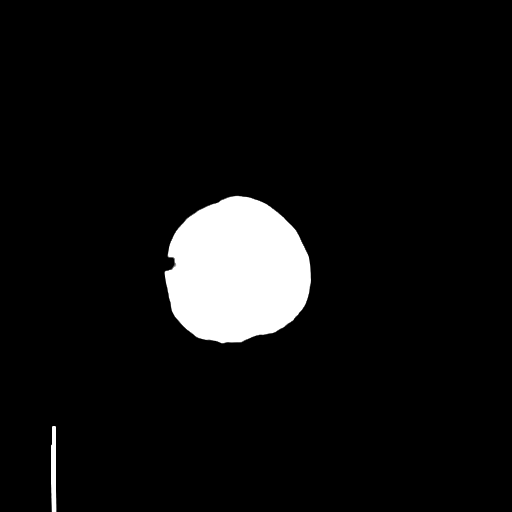
[im 28/31  bone]
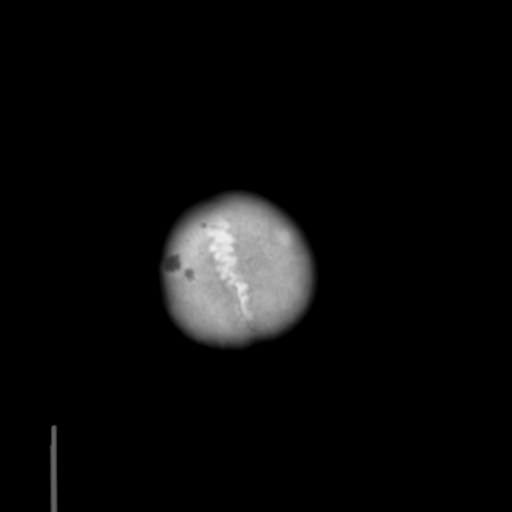

[Series 5: coronal soft tissue · coronal · 0.30mm/px · 3 of 70 slices shown]
[im 24/70  brain]
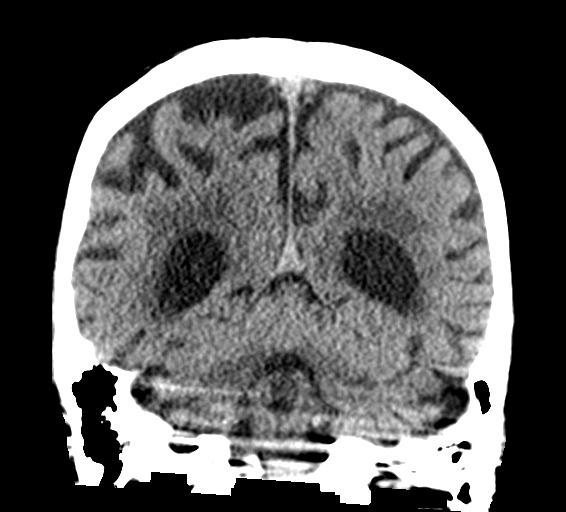
[im 31/70  brain]
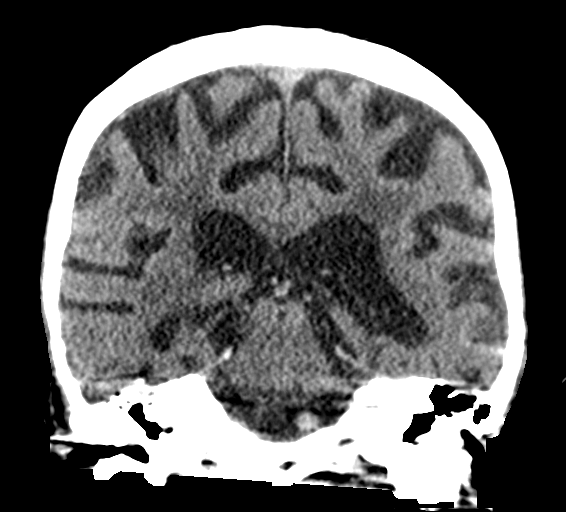
[im 39/70  brain]
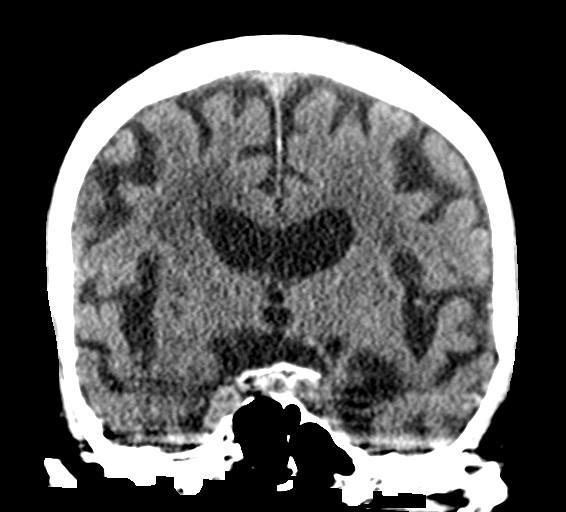

[Series 6: sagittal soft tissue · sagittal · 0.30mm/px · 3 of 52 slices shown]
[im 18/52  brain]
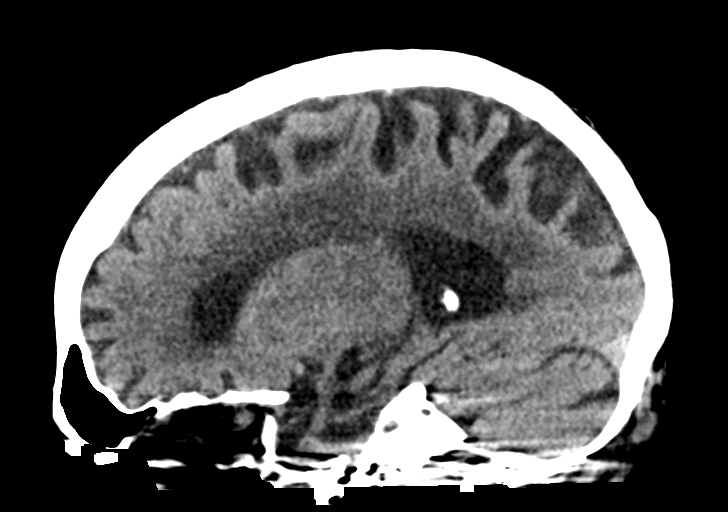
[im 26/52  brain]
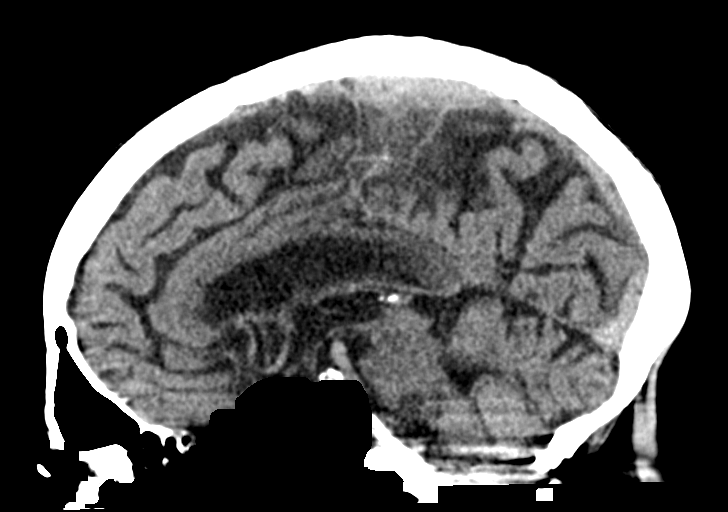
[im 35/52  brain]
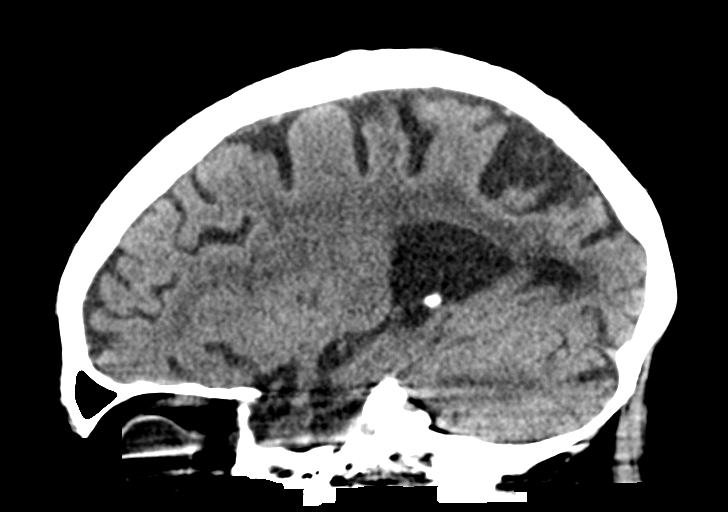

[15 of 47 positions shown; findings below may reference images not displayed]

FINDINGS: Brain: There is no evidence for acute hemorrhage, hydrocephalus,
mass lesion, or abnormal extra-axial fluid collection. No definite
CT evidence for acute infarction. Diffuse loss of parenchymal volume
is consistent with atrophy. Patchy low attenuation in the deep
hemispheric and periventricular white matter is nonspecific, but
likely reflects chronic microvascular ischemic demyelination.

Vascular: No hyperdense vessel or unexpected calcification.

Skull: No evidence for fracture. No worrisome lytic or sclerotic
lesion.

Sinuses/Orbits: The visualized paranasal sinuses and right mastoid
air cells are clear. Fluid noted in posterior left mastoid air
cells. Visualized portions of the globes and intraorbital fat are
unremarkable.

Other: None.
IMPRESSION: 1. No acute intracranial abnormality.
2. Atrophy with chronic small vessel white matter ischemic disease.

## 2020-02-18 IMAGING — CR DG CHEST 2V
2 series · 2 of 2 positions shown · non-contrast
Comparison: Single-view of the chest 11/14/2018. PA and lateral
chest 09/04/2017.

CLINICAL DATA: Hypoxia today.

EXAM:
CHEST - 2 VIEW

[chest lat]
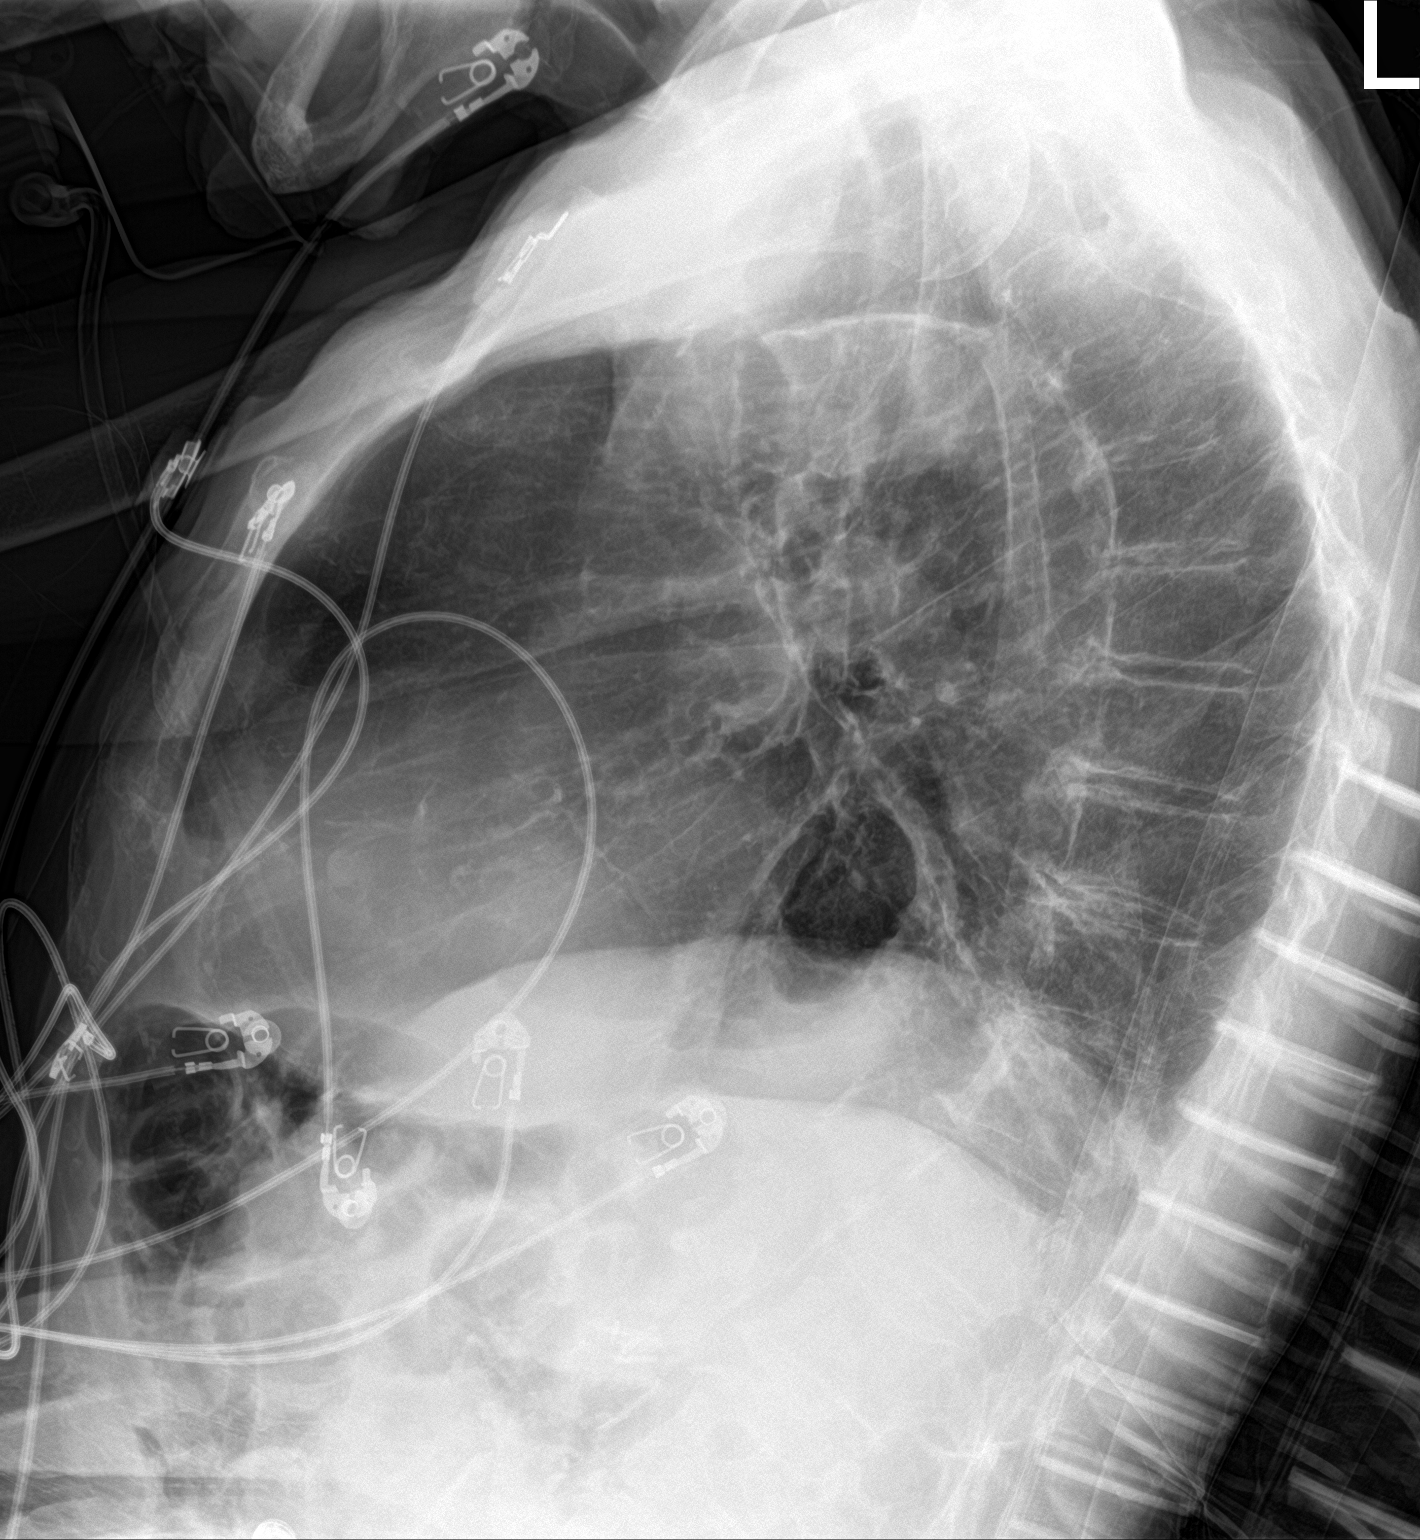

[chest ap]
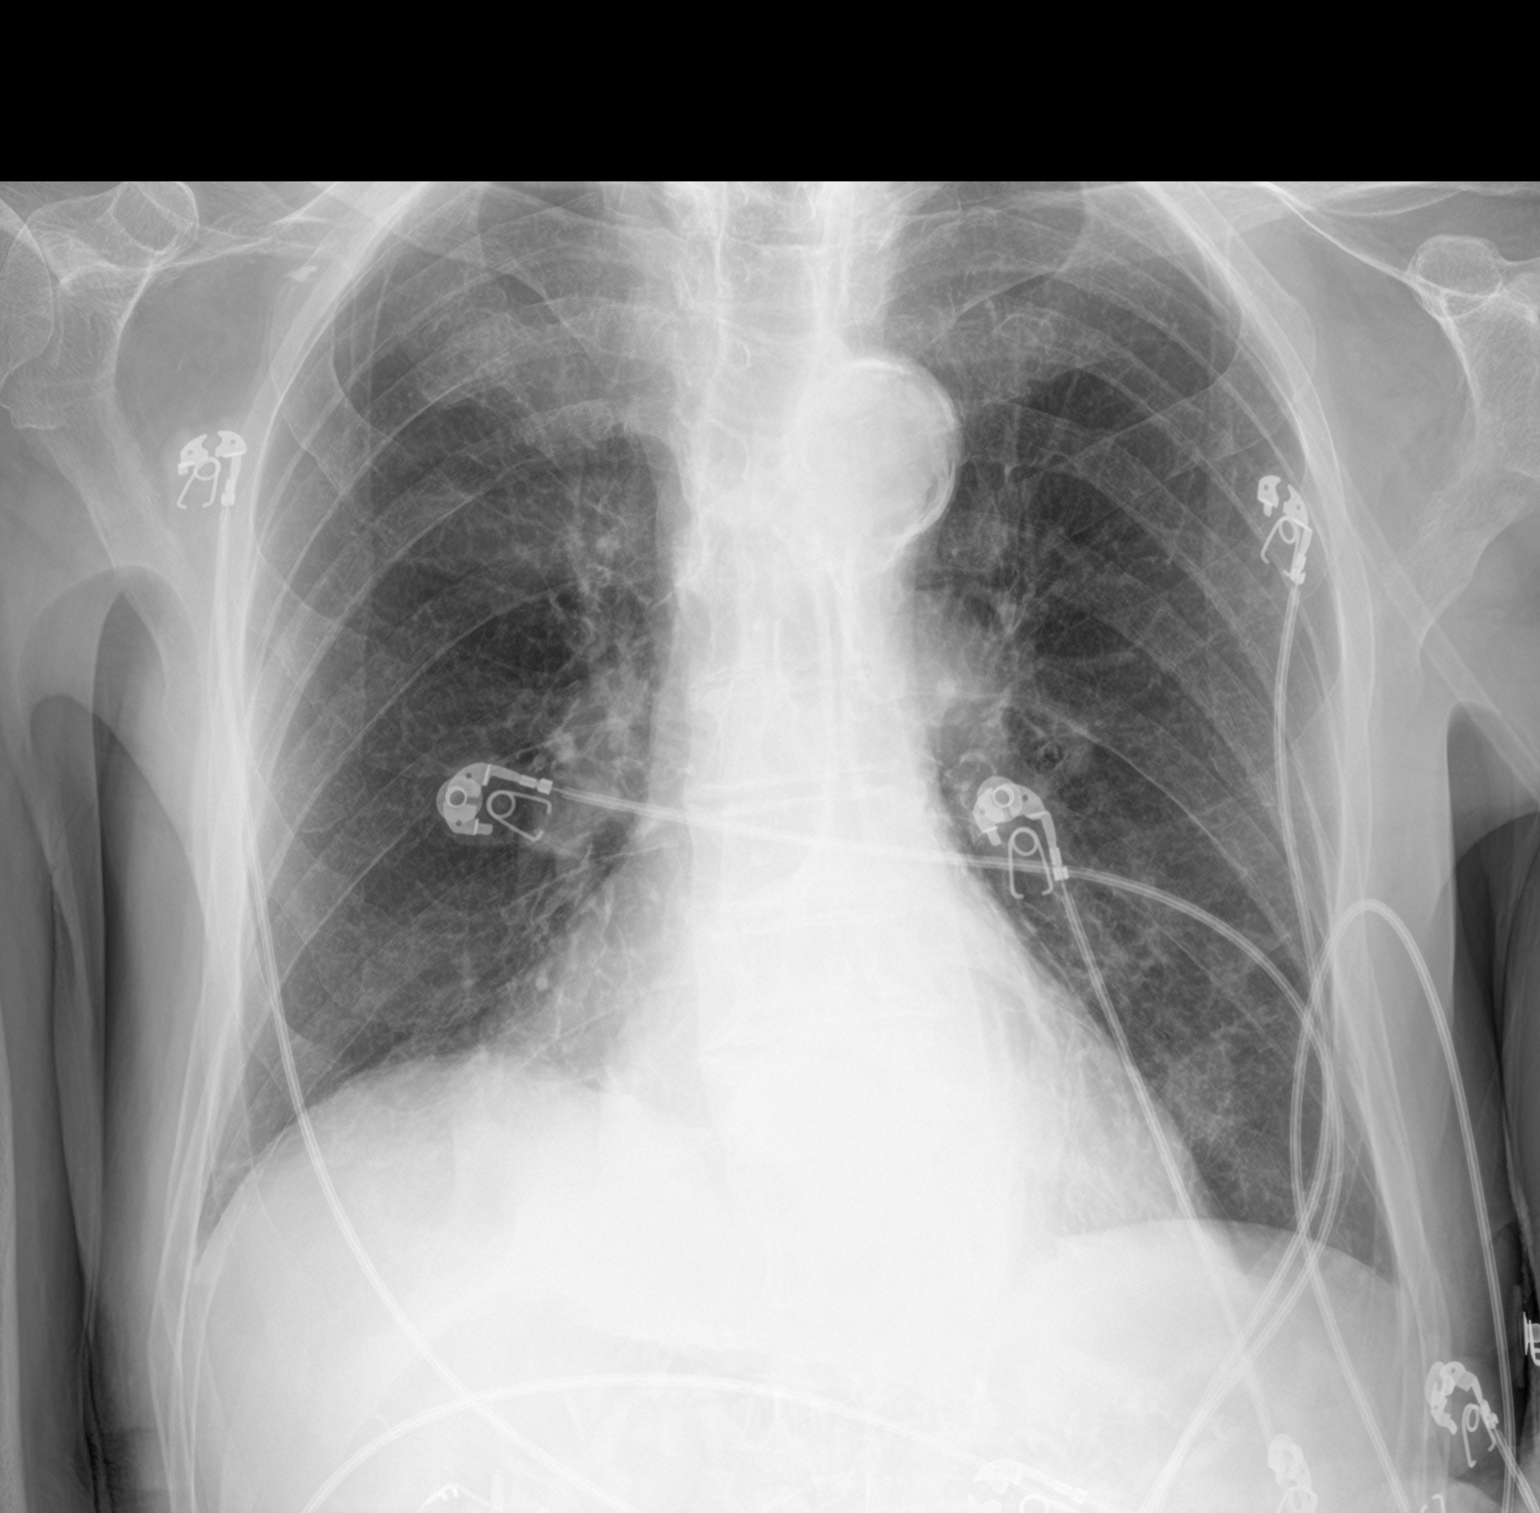

[2 of 2 positions shown; findings below may reference images not displayed]

FINDINGS: Lungs are clear. Hiatal hernia is noted. Mild elevation of the right
hemidiaphragm is unchanged. No pneumothorax or pleural effusion.
Aortic atherosclerosis is seen. No acute or focal bony abnormality.
IMPRESSION: No acute disease.

Cardiomegaly.

Atherosclerosis.

Hiatal hernia.
